# Patient Record
Sex: Female | Born: 1935
Health system: Southern US, Community
[De-identification: ages and names within clinical notes are randomized; demographics above are authoritative.]

## PROBLEM LIST (undated history)

## (undated) DIAGNOSIS — I10 Essential (primary) hypertension: Secondary | ICD-10-CM

## (undated) DIAGNOSIS — E785 Hyperlipidemia, unspecified: Secondary | ICD-10-CM

## (undated) DIAGNOSIS — N393 Stress incontinence (female) (male): Secondary | ICD-10-CM

## (undated) DIAGNOSIS — D6851 Activated protein C resistance: Secondary | ICD-10-CM

## (undated) DIAGNOSIS — O2230 Deep phlebothrombosis in pregnancy, unspecified trimester: Secondary | ICD-10-CM

## (undated) DIAGNOSIS — C55 Malignant neoplasm of uterus, part unspecified: Secondary | ICD-10-CM

## (undated) DIAGNOSIS — I2699 Other pulmonary embolism without acute cor pulmonale: Secondary | ICD-10-CM

## (undated) HISTORY — PX: CLEFT PALATE REPAIR: SUR1165

## (undated) HISTORY — DX: Other pulmonary embolism without acute cor pulmonale: I26.99

## (undated) HISTORY — DX: Malignant neoplasm of uterus, part unspecified: C55

## (undated) HISTORY — PX: HAMMER TOE SURGERY: SHX385

## (undated) HISTORY — DX: Stress incontinence (female) (male): N39.3

## (undated) HISTORY — PX: OTHER SURGICAL HISTORY: SHX169

## (undated) HISTORY — PX: TUBAL LIGATION: SHX77

## (undated) HISTORY — DX: Activated protein C resistance: D68.51

## (undated) HISTORY — DX: Hyperlipidemia, unspecified: E78.5

## (undated) HISTORY — DX: Essential (primary) hypertension: I10

---

## 1981-11-08 DIAGNOSIS — C55 Malignant neoplasm of uterus, part unspecified: Secondary | ICD-10-CM

## 1981-11-08 HISTORY — DX: Malignant neoplasm of uterus, part unspecified: C55

## 1999-12-21 ENCOUNTER — Other Ambulatory Visit: Admission: RE | Admit: 1999-12-21 | Discharge: 1999-12-21 | Payer: Self-pay | Admitting: *Deleted

## 2000-06-30 ENCOUNTER — Other Ambulatory Visit: Admission: RE | Admit: 2000-06-30 | Discharge: 2000-06-30 | Payer: Self-pay | Admitting: *Deleted

## 2000-07-20 ENCOUNTER — Encounter (INDEPENDENT_AMBULATORY_CARE_PROVIDER_SITE_OTHER): Payer: Self-pay

## 2000-07-20 ENCOUNTER — Other Ambulatory Visit: Admission: RE | Admit: 2000-07-20 | Discharge: 2000-07-20 | Payer: Self-pay | Admitting: *Deleted

## 2000-10-11 ENCOUNTER — Ambulatory Visit (HOSPITAL_COMMUNITY): Admission: RE | Admit: 2000-10-11 | Discharge: 2000-10-11 | Payer: Self-pay | Admitting: Gastroenterology

## 2001-03-08 ENCOUNTER — Encounter: Admission: RE | Admit: 2001-03-08 | Discharge: 2001-03-08 | Payer: Self-pay | Admitting: Family Medicine

## 2001-03-08 ENCOUNTER — Encounter: Payer: Self-pay | Admitting: Family Medicine

## 2001-08-10 ENCOUNTER — Other Ambulatory Visit: Admission: RE | Admit: 2001-08-10 | Discharge: 2001-08-10 | Payer: Self-pay | Admitting: *Deleted

## 2004-10-13 ENCOUNTER — Ambulatory Visit: Admission: RE | Admit: 2004-10-13 | Discharge: 2004-10-13 | Payer: Self-pay | Admitting: Gynecologic Oncology

## 2004-11-17 ENCOUNTER — Ambulatory Visit: Admission: RE | Admit: 2004-11-17 | Discharge: 2004-11-17 | Payer: Self-pay | Admitting: Gynecology

## 2004-11-24 ENCOUNTER — Encounter (INDEPENDENT_AMBULATORY_CARE_PROVIDER_SITE_OTHER): Payer: Self-pay | Admitting: Specialist

## 2004-11-24 ENCOUNTER — Ambulatory Visit (HOSPITAL_COMMUNITY): Admission: RE | Admit: 2004-11-24 | Discharge: 2004-11-24 | Payer: Self-pay | Admitting: Gynecology

## 2004-12-29 ENCOUNTER — Ambulatory Visit: Admission: RE | Admit: 2004-12-29 | Discharge: 2004-12-29 | Payer: Self-pay | Admitting: Gynecology

## 2005-07-29 ENCOUNTER — Encounter (INDEPENDENT_AMBULATORY_CARE_PROVIDER_SITE_OTHER): Payer: Self-pay | Admitting: Internal Medicine

## 2005-11-08 DIAGNOSIS — I2699 Other pulmonary embolism without acute cor pulmonale: Secondary | ICD-10-CM

## 2005-11-08 HISTORY — DX: Other pulmonary embolism without acute cor pulmonale: I26.99

## 2006-03-25 ENCOUNTER — Inpatient Hospital Stay (HOSPITAL_COMMUNITY): Admission: EM | Admit: 2006-03-25 | Discharge: 2006-03-28 | Payer: Self-pay | Admitting: Emergency Medicine

## 2006-03-25 ENCOUNTER — Ambulatory Visit: Payer: Self-pay | Admitting: Hospitalist

## 2006-03-26 ENCOUNTER — Encounter (INDEPENDENT_AMBULATORY_CARE_PROVIDER_SITE_OTHER): Payer: Self-pay | Admitting: Cardiology

## 2006-03-31 ENCOUNTER — Ambulatory Visit: Payer: Self-pay | Admitting: Internal Medicine

## 2006-04-05 ENCOUNTER — Ambulatory Visit: Payer: Self-pay | Admitting: Internal Medicine

## 2006-04-11 ENCOUNTER — Ambulatory Visit: Payer: Self-pay | Admitting: Internal Medicine

## 2006-04-15 ENCOUNTER — Ambulatory Visit: Payer: Self-pay | Admitting: Internal Medicine

## 2006-04-20 ENCOUNTER — Ambulatory Visit: Payer: Self-pay | Admitting: Internal Medicine

## 2006-04-25 ENCOUNTER — Ambulatory Visit: Payer: Self-pay | Admitting: Internal Medicine

## 2006-05-02 ENCOUNTER — Ambulatory Visit: Payer: Self-pay | Admitting: Internal Medicine

## 2006-05-16 ENCOUNTER — Ambulatory Visit: Payer: Self-pay | Admitting: Internal Medicine

## 2006-06-06 ENCOUNTER — Ambulatory Visit: Payer: Self-pay | Admitting: Internal Medicine

## 2006-06-27 ENCOUNTER — Ambulatory Visit: Payer: Self-pay | Admitting: Internal Medicine

## 2006-07-14 ENCOUNTER — Ambulatory Visit: Payer: Self-pay | Admitting: Hospitalist

## 2006-07-18 ENCOUNTER — Ambulatory Visit: Payer: Self-pay | Admitting: Internal Medicine

## 2006-08-08 ENCOUNTER — Ambulatory Visit: Payer: Self-pay | Admitting: Hospitalist

## 2006-08-14 ENCOUNTER — Encounter (INDEPENDENT_AMBULATORY_CARE_PROVIDER_SITE_OTHER): Payer: Self-pay | Admitting: Internal Medicine

## 2006-09-12 ENCOUNTER — Ambulatory Visit: Payer: Self-pay | Admitting: Internal Medicine

## 2006-10-17 ENCOUNTER — Ambulatory Visit: Payer: Self-pay | Admitting: Internal Medicine

## 2006-11-21 ENCOUNTER — Ambulatory Visit: Payer: Self-pay | Admitting: Hospitalist

## 2006-12-19 ENCOUNTER — Ambulatory Visit: Payer: Self-pay | Admitting: Hospitalist

## 2007-01-16 ENCOUNTER — Ambulatory Visit: Payer: Self-pay | Admitting: Internal Medicine

## 2007-01-16 ENCOUNTER — Encounter (INDEPENDENT_AMBULATORY_CARE_PROVIDER_SITE_OTHER): Payer: Self-pay | Admitting: Pharmacist

## 2007-01-16 DIAGNOSIS — I10 Essential (primary) hypertension: Secondary | ICD-10-CM | POA: Insufficient documentation

## 2007-01-16 DIAGNOSIS — M810 Age-related osteoporosis without current pathological fracture: Secondary | ICD-10-CM | POA: Insufficient documentation

## 2007-01-16 LAB — CONVERTED CEMR LAB: INR: 2.8

## 2007-02-13 ENCOUNTER — Encounter: Payer: Self-pay | Admitting: Pharmacist

## 2007-02-13 ENCOUNTER — Ambulatory Visit: Payer: Self-pay | Admitting: *Deleted

## 2007-02-13 LAB — CONVERTED CEMR LAB: INR: 3.3

## 2007-03-24 ENCOUNTER — Ambulatory Visit: Payer: Self-pay | Admitting: Hospitalist

## 2007-03-29 LAB — CONVERTED CEMR LAB
INR: 2.3
INR: 2.82

## 2007-04-12 ENCOUNTER — Ambulatory Visit: Payer: Self-pay | Admitting: Internal Medicine

## 2007-04-12 LAB — CONVERTED CEMR LAB
INR: 2.8 — ABNORMAL HIGH (ref 0.0–1.5)
Prothrombin Time: 30.9 s — ABNORMAL HIGH (ref 11.6–15.2)

## 2007-05-22 ENCOUNTER — Ambulatory Visit: Payer: Self-pay | Admitting: *Deleted

## 2007-05-22 LAB — CONVERTED CEMR LAB: INR: 3.9

## 2007-06-05 IMAGING — CT CT ANGIO CHEST
2 of 5 series · 17 of 35 positions shown · IV contrast (120 ML OMNI 300)
Comparison: none

CLINICAL DATA: History of cervical carcinoma.  Bilateral lower extremity DVT.
CT ANGIOGRAPHY OF CHEST:
TECHNIQUE: Multidetector CT imaging of the chest was performed during bolus injection of intravenous contrast.  Multiplanar CT angiographic image reconstructions were generated to evaluate the vascular anatomy.
Contrast:  120 cc Omnipaque 300

[Series 3: pe · axial · 0.74mm/px · z∈[-270,-36]mm · 16 of 214 slices shown]
[im 13/214  lung]
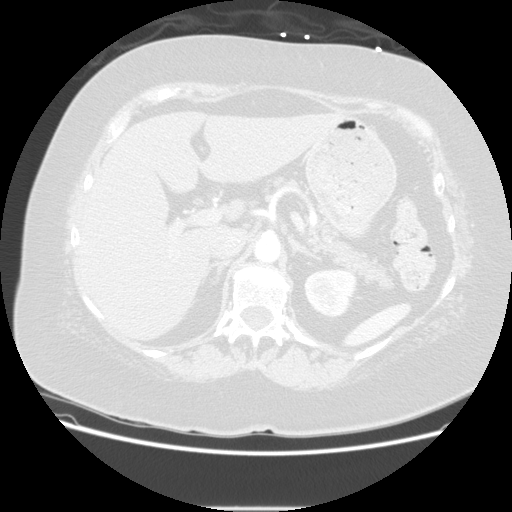
[im 26/214  mediastinal]
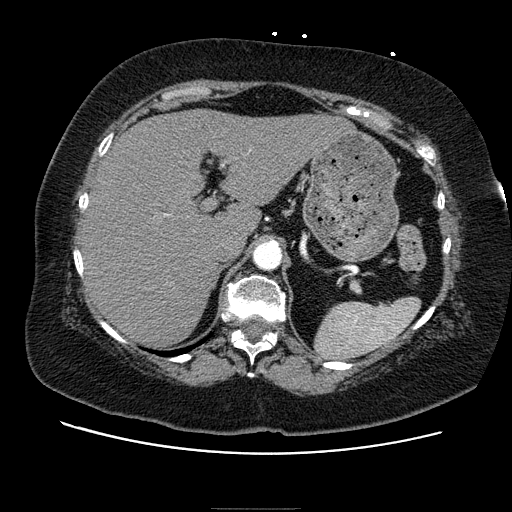
[im 38/214  lung]
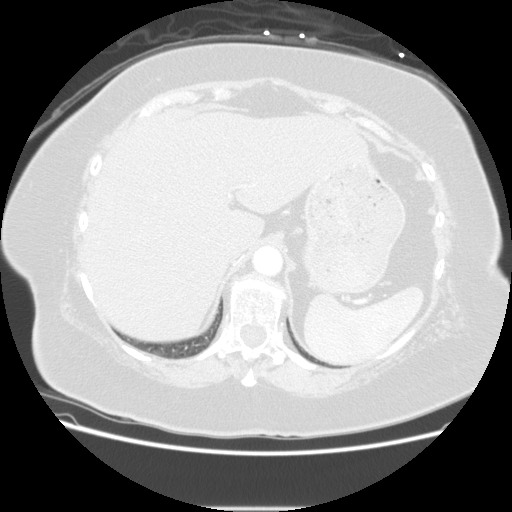
[im 51/214  mediastinal]
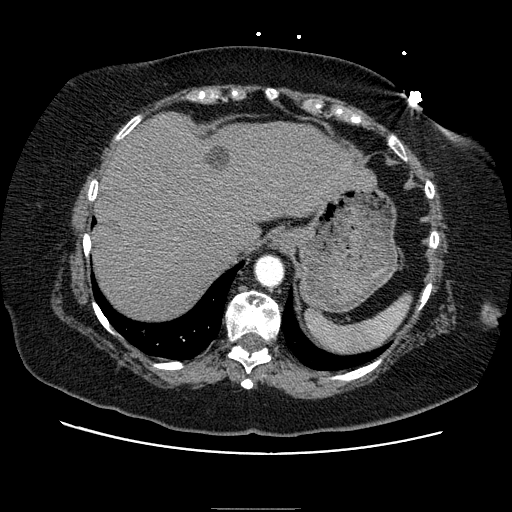
[im 63/214  lung]
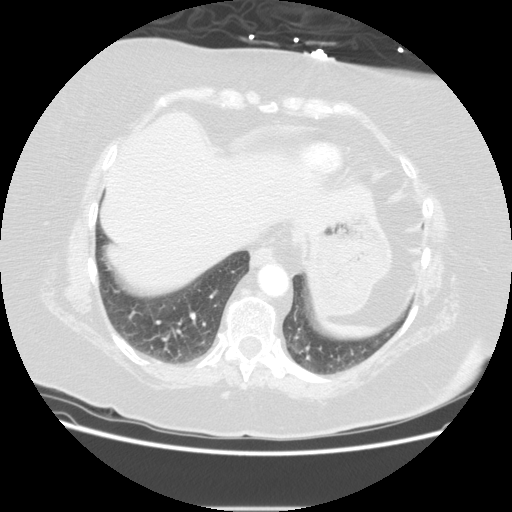
[im 76/214  mediastinal]
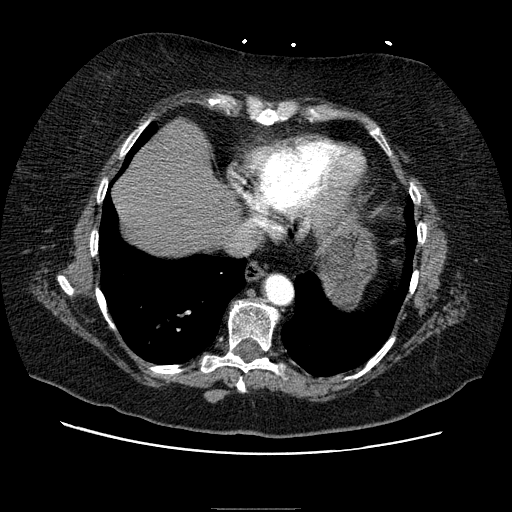
[im 88/214  lung]
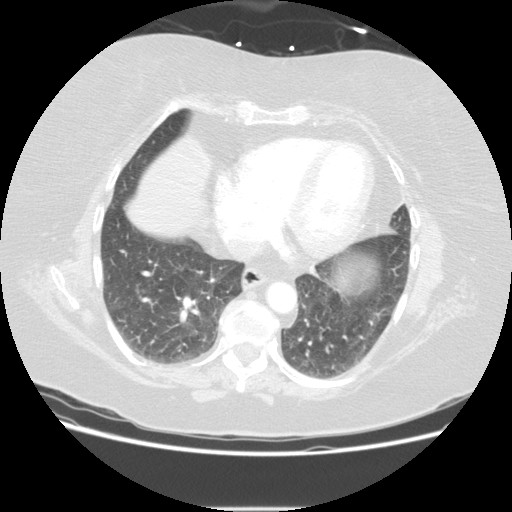
[im 101/214  mediastinal]
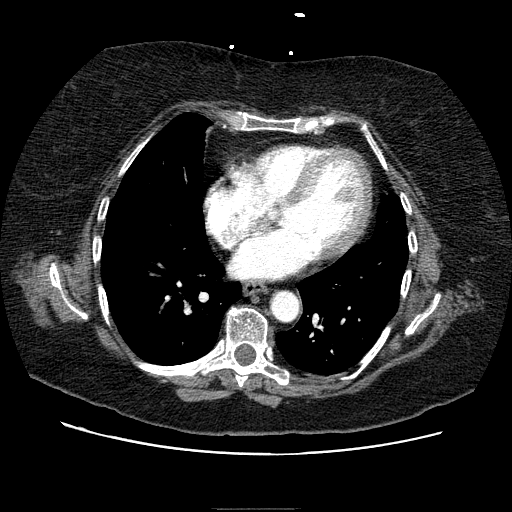
[im 113/214  lung]
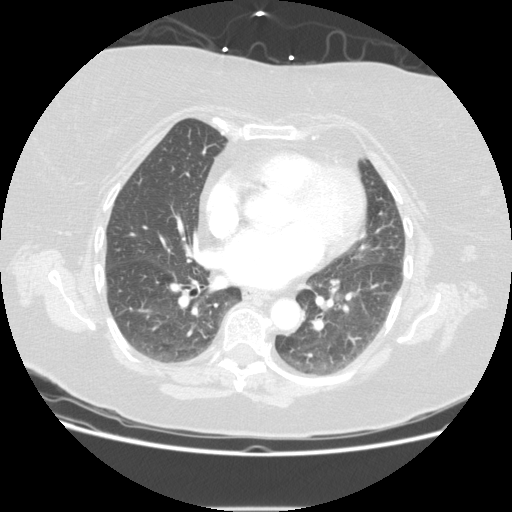
[im 126/214  mediastinal]
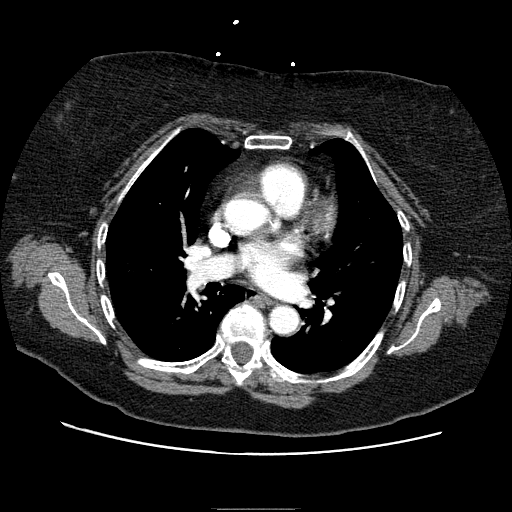
[im 138/214  lung]
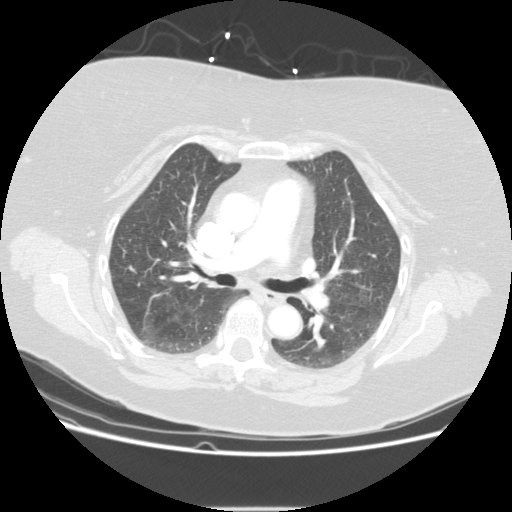
[im 151/214  mediastinal]
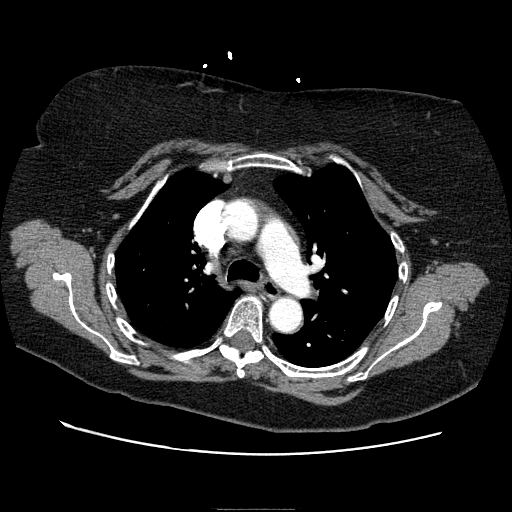
[im 163/214  lung]
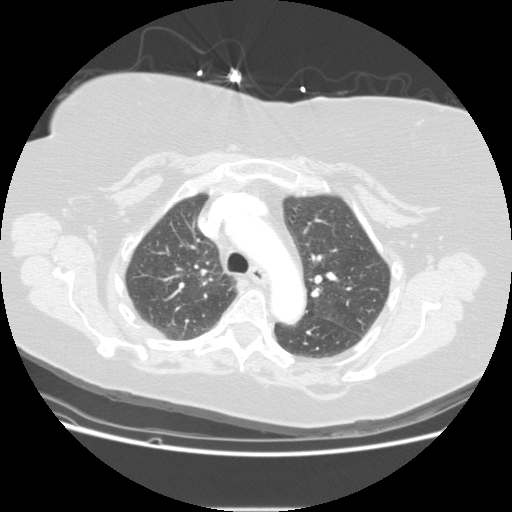
[im 176/214  mediastinal]
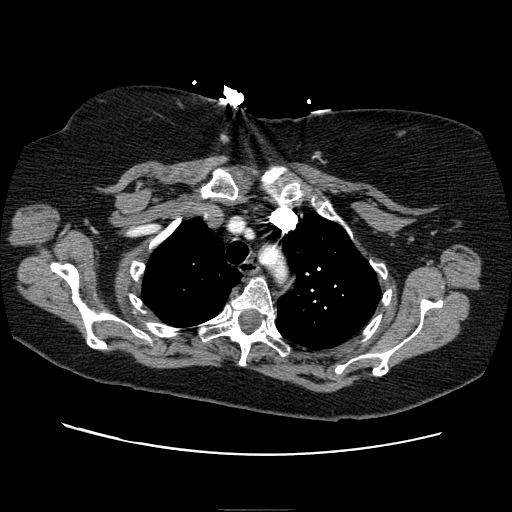
[im 188/214  lung]
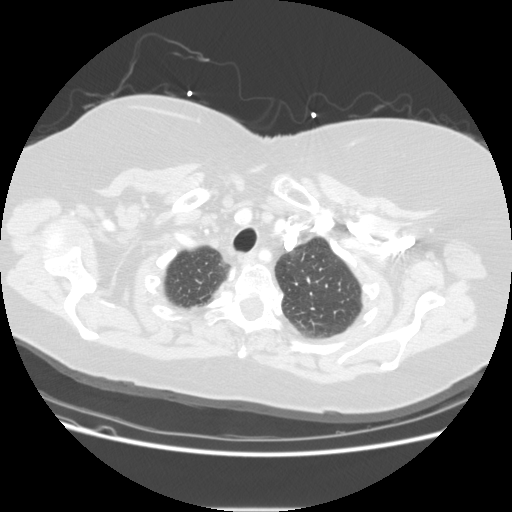
[im 201/214  mediastinal]
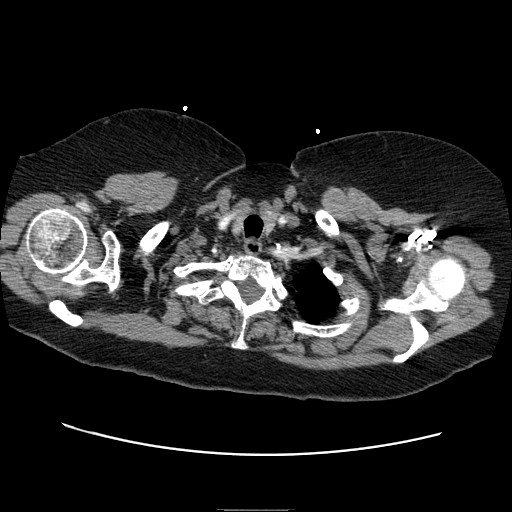

[Series 301: reformatted · coronal · 0.72mm/px · 1 of 116 slices shown]
[im 58/116  mediastinal]
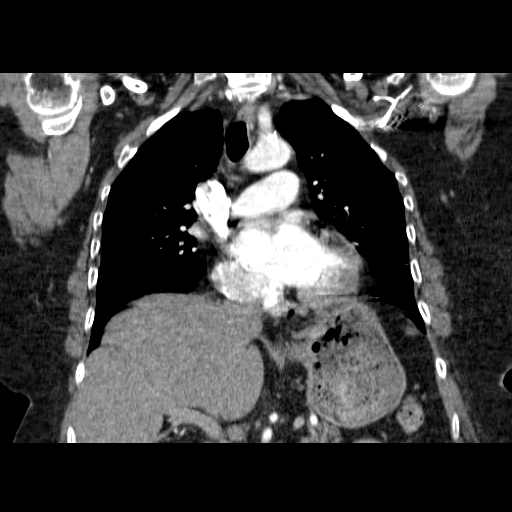

[17 of 35 positions shown; findings below may reference images not displayed]

FINDINGS: There are small filling defects at scattered branch points of the left upper and lower lobe pulmonary arteries compatible with pulmonary emboli.  However, these emboli have obtuse margins with the pulmonary artery wall and likely represents subacute to chronic pulmonary emboli.  No other pulmonary emboli are identified.  Mild cardiomegaly is present.  There is no evidence of thoracic aortic dissection or aneurysm.  No enlarged lymph nodes within the axillary, mediastinal, or hilar regions identified.  There are no pleural or pericardial effusions present.  Mild ground glass opacities through the mid and lower lungs are nonspecific but may be related to small vessel disease or changes associated with pulmonary emboli.  Hepatic cyst is noted.
IMPRESSION: Pulmonary emboli at branch points of several left pulmonary arteries with appearance and configuration suggestive of subacute to chronic pulmonary emboli.

## 2007-06-19 ENCOUNTER — Ambulatory Visit: Payer: Self-pay | Admitting: Infectious Disease

## 2007-06-19 LAB — CONVERTED CEMR LAB: INR: 2.6

## 2007-07-17 ENCOUNTER — Ambulatory Visit: Payer: Self-pay | Admitting: Internal Medicine

## 2007-07-17 LAB — CONVERTED CEMR LAB: INR: 2

## 2007-08-15 ENCOUNTER — Encounter (INDEPENDENT_AMBULATORY_CARE_PROVIDER_SITE_OTHER): Payer: Self-pay | Admitting: Internal Medicine

## 2007-08-21 ENCOUNTER — Ambulatory Visit: Payer: Self-pay | Admitting: Infectious Diseases

## 2007-08-21 LAB — CONVERTED CEMR LAB: INR: 2.6

## 2007-09-18 ENCOUNTER — Ambulatory Visit: Payer: Self-pay | Admitting: Internal Medicine

## 2007-10-23 ENCOUNTER — Ambulatory Visit: Payer: Self-pay | Admitting: Infectious Diseases

## 2007-10-23 LAB — CONVERTED CEMR LAB: INR: 5.3

## 2007-10-30 ENCOUNTER — Ambulatory Visit: Payer: Self-pay | Admitting: Hospitalist

## 2007-10-30 LAB — CONVERTED CEMR LAB: INR: 1.8

## 2007-11-27 ENCOUNTER — Ambulatory Visit: Payer: Self-pay | Admitting: Internal Medicine

## 2007-11-27 LAB — CONVERTED CEMR LAB: INR: 3.2

## 2007-12-25 ENCOUNTER — Ambulatory Visit: Payer: Self-pay | Admitting: Hospitalist

## 2007-12-25 LAB — CONVERTED CEMR LAB: INR: 2.3

## 2008-01-22 ENCOUNTER — Ambulatory Visit: Payer: Self-pay | Admitting: Hospitalist

## 2008-01-22 LAB — CONVERTED CEMR LAB: INR: 1.7

## 2008-02-12 ENCOUNTER — Ambulatory Visit: Payer: Self-pay | Admitting: Internal Medicine

## 2008-02-12 ENCOUNTER — Ambulatory Visit: Payer: Self-pay | Admitting: Hospitalist

## 2008-02-12 LAB — CONVERTED CEMR LAB
ALT: 16 units/L (ref 0–35)
AST: 21 units/L (ref 0–37)
Albumin: 4.4 g/dL (ref 3.5–5.2)
Alkaline Phosphatase: 95 units/L (ref 39–117)
BUN: 22 mg/dL (ref 6–23)
CO2: 22 meq/L (ref 19–32)
Calcium: 9.8 mg/dL (ref 8.4–10.5)
Chloride: 106 meq/L (ref 96–112)
Creatinine, Ser: 0.85 mg/dL (ref 0.40–1.20)
Glucose, Bld: 85 mg/dL (ref 70–99)
INR: 2.5
Potassium: 4.4 meq/L (ref 3.5–5.3)
Sodium: 140 meq/L (ref 135–145)
Total Bilirubin: 0.3 mg/dL (ref 0.3–1.2)
Total Protein: 7.4 g/dL (ref 6.0–8.3)

## 2008-02-19 ENCOUNTER — Ambulatory Visit: Payer: Self-pay | Admitting: Internal Medicine

## 2008-02-19 LAB — CONVERTED CEMR LAB: INR: 3.1

## 2008-02-20 ENCOUNTER — Encounter (INDEPENDENT_AMBULATORY_CARE_PROVIDER_SITE_OTHER): Payer: Self-pay | Admitting: Internal Medicine

## 2008-02-28 ENCOUNTER — Ambulatory Visit: Payer: Self-pay | Admitting: Hospitalist

## 2008-02-28 ENCOUNTER — Ambulatory Visit (HOSPITAL_COMMUNITY): Admission: RE | Admit: 2008-02-28 | Discharge: 2008-02-28 | Payer: Self-pay | Admitting: Hospitalist

## 2008-02-28 ENCOUNTER — Telehealth: Payer: Self-pay | Admitting: *Deleted

## 2008-02-29 ENCOUNTER — Encounter: Payer: Self-pay | Admitting: Internal Medicine

## 2008-03-04 ENCOUNTER — Encounter (INDEPENDENT_AMBULATORY_CARE_PROVIDER_SITE_OTHER): Payer: Self-pay | Admitting: Internal Medicine

## 2008-03-04 ENCOUNTER — Ambulatory Visit: Payer: Self-pay | Admitting: Hospitalist

## 2008-03-04 LAB — CONVERTED CEMR LAB
ALT: 16 units/L (ref 0–35)
AST: 21 units/L (ref 0–37)
Albumin: 4.2 g/dL (ref 3.5–5.2)
Alkaline Phosphatase: 85 units/L (ref 39–117)
BUN: 26 mg/dL — ABNORMAL HIGH (ref 6–23)
Basophils Absolute: 0 10*3/uL (ref 0.0–0.1)
Basophils Relative: 0 % (ref 0–1)
CO2: 19 meq/L (ref 19–32)
Calcium: 9.1 mg/dL (ref 8.4–10.5)
Chloride: 109 meq/L (ref 96–112)
Cholesterol: 223 mg/dL — ABNORMAL HIGH (ref 0–200)
Creatinine, Ser: 0.84 mg/dL (ref 0.40–1.20)
Eosinophils Absolute: 0.2 10*3/uL (ref 0.0–0.7)
Eosinophils Relative: 3 % (ref 0–5)
Glucose, Bld: 89 mg/dL (ref 70–99)
HCT: 44.5 % (ref 36.0–46.0)
HDL: 58 mg/dL (ref >39)
Hemoglobin: 13.9 g/dL (ref 12.0–15.0)
INR: 2.2
LDL Cholesterol: 126 mg/dL — ABNORMAL HIGH (ref 0–99)
Lymphocytes Relative: 30 % (ref 12–46)
Lymphs Abs: 1.8 10*3/uL (ref 0.7–4.0)
MCHC: 31.2 g/dL (ref 30.0–36.0)
MCV: 100.2 fL — ABNORMAL HIGH (ref 78.0–100.0)
Monocytes Absolute: 0.5 10*3/uL (ref 0.1–1.0)
Monocytes Relative: 9 % (ref 3–12)
Neutro Abs: 3.5 10*3/uL (ref 1.7–7.7)
Neutrophils Relative %: 58 % (ref 43–77)
Platelets: 263 10*3/uL (ref 150–400)
Potassium: 4.7 meq/L (ref 3.5–5.3)
RBC: 4.44 M/uL (ref 3.87–5.11)
RDW: 12.9 % (ref 11.5–15.5)
Sodium: 143 meq/L (ref 135–145)
TSH: 1.728 microintl units/mL (ref 0.350–5.50)
Total Bilirubin: 0.3 mg/dL (ref 0.3–1.2)
Total CHOL/HDL Ratio: 3.8
Total Protein: 7 g/dL (ref 6.0–8.3)
Triglycerides: 195 mg/dL — ABNORMAL HIGH (ref <150)
VLDL: 39 mg/dL (ref 0–40)
WBC: 6 10*3/uL (ref 4.0–10.5)

## 2008-03-19 ENCOUNTER — Ambulatory Visit: Payer: Self-pay | Admitting: Internal Medicine

## 2008-03-25 ENCOUNTER — Ambulatory Visit: Payer: Self-pay | Admitting: Internal Medicine

## 2008-04-09 ENCOUNTER — Encounter (INDEPENDENT_AMBULATORY_CARE_PROVIDER_SITE_OTHER): Payer: Self-pay | Admitting: Internal Medicine

## 2008-04-22 ENCOUNTER — Ambulatory Visit: Payer: Self-pay | Admitting: *Deleted

## 2008-04-22 LAB — CONVERTED CEMR LAB: INR: 5.9

## 2008-04-29 ENCOUNTER — Ambulatory Visit: Payer: Self-pay | Admitting: Internal Medicine

## 2008-04-29 LAB — CONVERTED CEMR LAB: INR: 2.1

## 2008-05-01 ENCOUNTER — Encounter (INDEPENDENT_AMBULATORY_CARE_PROVIDER_SITE_OTHER): Payer: Self-pay | Admitting: Internal Medicine

## 2008-05-06 ENCOUNTER — Encounter (INDEPENDENT_AMBULATORY_CARE_PROVIDER_SITE_OTHER): Payer: Self-pay | Admitting: Internal Medicine

## 2008-05-27 ENCOUNTER — Ambulatory Visit: Payer: Self-pay | Admitting: Infectious Diseases

## 2008-05-27 LAB — CONVERTED CEMR LAB: INR: 3

## 2008-06-26 ENCOUNTER — Encounter (INDEPENDENT_AMBULATORY_CARE_PROVIDER_SITE_OTHER): Payer: Self-pay | Admitting: Internal Medicine

## 2008-07-01 ENCOUNTER — Ambulatory Visit: Payer: Self-pay | Admitting: Internal Medicine

## 2008-07-01 LAB — CONVERTED CEMR LAB: INR: 2.1

## 2008-07-02 ENCOUNTER — Ambulatory Visit: Payer: Self-pay | Admitting: Internal Medicine

## 2008-07-29 ENCOUNTER — Ambulatory Visit: Payer: Self-pay | Admitting: Internal Medicine

## 2008-07-29 LAB — CONVERTED CEMR LAB: INR: 2.9

## 2008-08-15 ENCOUNTER — Encounter (INDEPENDENT_AMBULATORY_CARE_PROVIDER_SITE_OTHER): Payer: Self-pay | Admitting: Internal Medicine

## 2008-08-21 ENCOUNTER — Encounter (INDEPENDENT_AMBULATORY_CARE_PROVIDER_SITE_OTHER): Payer: Self-pay | Admitting: Internal Medicine

## 2008-09-09 ENCOUNTER — Ambulatory Visit: Payer: Self-pay | Admitting: Infectious Diseases

## 2008-09-09 LAB — CONVERTED CEMR LAB: INR: 2

## 2008-10-14 ENCOUNTER — Ambulatory Visit: Payer: Self-pay | Admitting: Internal Medicine

## 2008-10-14 LAB — CONVERTED CEMR LAB: INR: 2.1

## 2008-11-11 ENCOUNTER — Ambulatory Visit: Payer: Self-pay | Admitting: Internal Medicine

## 2008-11-11 LAB — CONVERTED CEMR LAB: INR: 4.3

## 2008-11-19 ENCOUNTER — Ambulatory Visit: Payer: Self-pay | Admitting: Internal Medicine

## 2008-11-19 DIAGNOSIS — D539 Nutritional anemia, unspecified: Secondary | ICD-10-CM | POA: Insufficient documentation

## 2008-11-21 ENCOUNTER — Encounter (INDEPENDENT_AMBULATORY_CARE_PROVIDER_SITE_OTHER): Payer: Self-pay | Admitting: Internal Medicine

## 2008-11-21 LAB — CONVERTED CEMR LAB
BUN: 24 mg/dL — ABNORMAL HIGH (ref 6–23)
CO2: 26 meq/L (ref 19–32)
Calcium: 10 mg/dL (ref 8.4–10.5)
Chloride: 105 meq/L (ref 96–112)
Creatinine, Ser: 0.9 mg/dL (ref 0.40–1.20)
Glucose, Bld: 88 mg/dL (ref 70–99)
HCT: 42.2 % (ref 36.0–46.0)
Hemoglobin: 13.4 g/dL (ref 12.0–15.0)
MCHC: 31.8 g/dL (ref 30.0–36.0)
MCV: 99.5 fL (ref 78.0–100.0)
Platelets: 348 10*3/uL (ref 150–400)
Potassium: 5.6 meq/L — ABNORMAL HIGH (ref 3.5–5.3)
RBC: 4.24 M/uL (ref 3.87–5.11)
RDW: 13.9 % (ref 11.5–15.5)
Sodium: 142 meq/L (ref 135–145)
WBC: 6.8 10*3/uL (ref 4.0–10.5)

## 2008-12-02 ENCOUNTER — Ambulatory Visit: Payer: Self-pay | Admitting: Internal Medicine

## 2008-12-02 ENCOUNTER — Encounter (INDEPENDENT_AMBULATORY_CARE_PROVIDER_SITE_OTHER): Payer: Self-pay | Admitting: Internal Medicine

## 2008-12-02 LAB — CONVERTED CEMR LAB: INR: 1.8

## 2008-12-05 LAB — CONVERTED CEMR LAB
BUN: 21 mg/dL (ref 6–23)
CO2: 23 meq/L (ref 19–32)
Calcium: 9.9 mg/dL (ref 8.4–10.5)
Chloride: 105 meq/L (ref 96–112)
Creatinine, Ser: 0.75 mg/dL (ref 0.40–1.20)
Glucose, Bld: 80 mg/dL (ref 70–99)
Potassium: 5.3 meq/L (ref 3.5–5.3)
Sodium: 141 meq/L (ref 135–145)

## 2008-12-24 ENCOUNTER — Ambulatory Visit: Payer: Self-pay | Admitting: Internal Medicine

## 2008-12-24 DIAGNOSIS — I872 Venous insufficiency (chronic) (peripheral): Secondary | ICD-10-CM | POA: Insufficient documentation

## 2008-12-27 LAB — CONVERTED CEMR LAB
ALT: 13 units/L (ref 0–35)
AST: 18 units/L (ref 0–37)
Albumin: 4.2 g/dL (ref 3.5–5.2)
Alkaline Phosphatase: 81 units/L (ref 39–117)
BUN: 24 mg/dL — ABNORMAL HIGH (ref 6–23)
CO2: 23 meq/L (ref 19–32)
Calcium: 9.5 mg/dL (ref 8.4–10.5)
Chloride: 105 meq/L (ref 96–112)
Cholesterol: 205 mg/dL — ABNORMAL HIGH (ref 0–200)
Creatinine, Ser: 0.84 mg/dL (ref 0.40–1.20)
Glucose, Bld: 86 mg/dL (ref 70–99)
HDL: 70 mg/dL (ref >39)
LDL Cholesterol: 98 mg/dL (ref 0–99)
Potassium: 5.2 meq/L (ref 3.5–5.3)
Sodium: 141 meq/L (ref 135–145)
Total Bilirubin: 0.2 mg/dL — ABNORMAL LOW (ref 0.3–1.2)
Total CHOL/HDL Ratio: 2.9
Total Protein: 7.1 g/dL (ref 6.0–8.3)
Triglycerides: 183 mg/dL — ABNORMAL HIGH (ref <150)
VLDL: 37 mg/dL (ref 0–40)

## 2009-01-02 ENCOUNTER — Ambulatory Visit: Payer: Self-pay | Admitting: Internal Medicine

## 2009-01-02 LAB — CONVERTED CEMR LAB: INR: 3.5

## 2009-01-06 ENCOUNTER — Telehealth: Payer: Self-pay | Admitting: *Deleted

## 2009-01-09 ENCOUNTER — Encounter (INDEPENDENT_AMBULATORY_CARE_PROVIDER_SITE_OTHER): Payer: Self-pay | Admitting: Internal Medicine

## 2009-01-27 ENCOUNTER — Ambulatory Visit: Payer: Self-pay | Admitting: Internal Medicine

## 2009-01-27 LAB — CONVERTED CEMR LAB: INR: 2.4

## 2009-01-30 ENCOUNTER — Encounter (INDEPENDENT_AMBULATORY_CARE_PROVIDER_SITE_OTHER): Payer: Self-pay | Admitting: Internal Medicine

## 2009-02-25 ENCOUNTER — Ambulatory Visit: Payer: Self-pay | Admitting: Internal Medicine

## 2009-02-26 LAB — CONVERTED CEMR LAB
BUN: 25 mg/dL — ABNORMAL HIGH (ref 6–23)
CO2: 24 meq/L (ref 19–32)
Calcium: 10.3 mg/dL (ref 8.4–10.5)
Chloride: 103 meq/L (ref 96–112)
Creatinine, Ser: 0.88 mg/dL (ref 0.40–1.20)
GFR calc Af Amer: >60 mL/min (ref >60)
GFR calc non Af Amer: >60 mL/min (ref >60)
Glucose, Bld: 100 mg/dL — ABNORMAL HIGH (ref 70–99)
Potassium: 4.7 meq/L (ref 3.5–5.3)
Sodium: 141 meq/L (ref 135–145)

## 2009-03-03 ENCOUNTER — Ambulatory Visit: Payer: Self-pay | Admitting: Internal Medicine

## 2009-03-03 ENCOUNTER — Encounter: Payer: Self-pay | Admitting: Pharmacist

## 2009-03-03 LAB — CONVERTED CEMR LAB: INR: 3.3

## 2009-04-21 ENCOUNTER — Ambulatory Visit: Payer: Self-pay | Admitting: Internal Medicine

## 2009-04-21 LAB — CONVERTED CEMR LAB: INR: 3.2

## 2009-05-19 ENCOUNTER — Ambulatory Visit: Payer: Self-pay | Admitting: Infectious Diseases

## 2009-05-19 LAB — CONVERTED CEMR LAB: INR: 2.1

## 2009-06-16 ENCOUNTER — Ambulatory Visit: Payer: Self-pay | Admitting: Internal Medicine

## 2009-06-16 LAB — CONVERTED CEMR LAB: INR: 2

## 2009-06-19 ENCOUNTER — Ambulatory Visit: Payer: Self-pay | Admitting: Internal Medicine

## 2009-06-19 DIAGNOSIS — D6851 Activated protein C resistance: Secondary | ICD-10-CM | POA: Insufficient documentation

## 2009-06-24 ENCOUNTER — Ambulatory Visit: Payer: Self-pay | Admitting: Internal Medicine

## 2009-07-21 ENCOUNTER — Ambulatory Visit: Payer: Self-pay | Admitting: Infectious Diseases

## 2009-07-21 LAB — CONVERTED CEMR LAB: INR: 3.1

## 2009-08-11 ENCOUNTER — Ambulatory Visit: Payer: Self-pay | Admitting: Internal Medicine

## 2009-08-11 LAB — CONVERTED CEMR LAB: INR: 3.7

## 2009-08-27 ENCOUNTER — Encounter: Payer: Self-pay | Admitting: Internal Medicine

## 2009-09-01 ENCOUNTER — Ambulatory Visit: Payer: Self-pay | Admitting: Internal Medicine

## 2009-09-01 LAB — CONVERTED CEMR LAB: INR: 2.3

## 2009-09-30 ENCOUNTER — Ambulatory Visit: Payer: Self-pay | Admitting: Internal Medicine

## 2009-10-01 ENCOUNTER — Telehealth: Payer: Self-pay | Admitting: *Deleted

## 2009-10-01 LAB — CONVERTED CEMR LAB
INR: 2.39 — ABNORMAL HIGH (ref <1.50)
Prothrombin Time: 25.9 s — ABNORMAL HIGH (ref 11.6–15.2)

## 2009-10-06 ENCOUNTER — Telehealth (INDEPENDENT_AMBULATORY_CARE_PROVIDER_SITE_OTHER): Payer: Self-pay | Admitting: Pharmacist

## 2009-11-10 ENCOUNTER — Ambulatory Visit: Payer: Self-pay | Admitting: Internal Medicine

## 2009-11-10 LAB — CONVERTED CEMR LAB: INR: 3.9

## 2009-11-18 ENCOUNTER — Telehealth: Payer: Self-pay | Admitting: Internal Medicine

## 2009-12-01 ENCOUNTER — Ambulatory Visit: Payer: Self-pay | Admitting: Internal Medicine

## 2009-12-01 LAB — CONVERTED CEMR LAB: INR: 2.1

## 2009-12-15 ENCOUNTER — Telehealth: Payer: Self-pay | Admitting: Internal Medicine

## 2009-12-15 ENCOUNTER — Ambulatory Visit: Payer: Self-pay | Admitting: Internal Medicine

## 2009-12-15 ENCOUNTER — Encounter: Payer: Self-pay | Admitting: Pharmacist

## 2009-12-15 LAB — CONVERTED CEMR LAB: INR: 1.7

## 2010-01-12 ENCOUNTER — Ambulatory Visit: Payer: Self-pay | Admitting: Infectious Diseases

## 2010-01-12 LAB — CONVERTED CEMR LAB: INR: 4.4

## 2010-01-26 ENCOUNTER — Ambulatory Visit: Payer: Self-pay | Admitting: Internal Medicine

## 2010-01-26 LAB — CONVERTED CEMR LAB: INR: 4.2

## 2010-02-16 ENCOUNTER — Ambulatory Visit: Payer: Self-pay | Admitting: Internal Medicine

## 2010-02-16 LAB — CONVERTED CEMR LAB: INR: 2.2

## 2010-03-16 ENCOUNTER — Ambulatory Visit: Payer: Self-pay | Admitting: Internal Medicine

## 2010-03-16 LAB — CONVERTED CEMR LAB: INR: 2.5

## 2010-04-13 ENCOUNTER — Ambulatory Visit: Payer: Self-pay | Admitting: Internal Medicine

## 2010-04-13 LAB — CONVERTED CEMR LAB: INR: 2.3

## 2010-05-04 ENCOUNTER — Ambulatory Visit: Payer: Self-pay | Admitting: Internal Medicine

## 2010-05-04 LAB — CONVERTED CEMR LAB: INR: 1.8

## 2010-05-25 ENCOUNTER — Ambulatory Visit: Payer: Self-pay | Admitting: Internal Medicine

## 2010-05-25 LAB — CONVERTED CEMR LAB: INR: 2.8

## 2010-06-22 ENCOUNTER — Ambulatory Visit: Payer: Self-pay | Admitting: Internal Medicine

## 2010-06-22 LAB — CONVERTED CEMR LAB: INR: 3.2

## 2010-07-07 ENCOUNTER — Ambulatory Visit (HOSPITAL_COMMUNITY): Admission: RE | Admit: 2010-07-07 | Discharge: 2010-07-07 | Payer: Self-pay | Admitting: Internal Medicine

## 2010-07-07 ENCOUNTER — Ambulatory Visit: Payer: Self-pay | Admitting: Internal Medicine

## 2010-07-20 ENCOUNTER — Ambulatory Visit: Payer: Self-pay | Admitting: Internal Medicine

## 2010-07-20 LAB — CONVERTED CEMR LAB
ALT: 15 units/L (ref 0–35)
AST: 22 units/L (ref 0–37)
Albumin: 4.2 g/dL (ref 3.5–5.2)
Alkaline Phosphatase: 83 units/L (ref 39–117)
BUN: 25 mg/dL — ABNORMAL HIGH (ref 6–23)
CO2: 26 meq/L (ref 19–32)
Calcium: 9.6 mg/dL (ref 8.4–10.5)
Chloride: 103 meq/L (ref 96–112)
Cholesterol: 206 mg/dL — ABNORMAL HIGH (ref 0–200)
Creatinine, Ser: 0.93 mg/dL (ref 0.40–1.20)
Direct LDL: 133 mg/dL — ABNORMAL HIGH
Glucose, Bld: 92 mg/dL (ref 70–99)
HDL: 65 mg/dL (ref >39)
INR: 3.1
LDL Cholesterol: 96 mg/dL (ref 0–99)
Potassium: 4.2 meq/L (ref 3.5–5.3)
Sodium: 141 meq/L (ref 135–145)
Total Bilirubin: 0.3 mg/dL (ref 0.3–1.2)
Total CHOL/HDL Ratio: 3.2
Total Protein: 7.1 g/dL (ref 6.0–8.3)
Triglycerides: 226 mg/dL — ABNORMAL HIGH (ref <150)
VLDL: 45 mg/dL — ABNORMAL HIGH (ref 0–40)

## 2010-08-18 ENCOUNTER — Ambulatory Visit: Payer: Self-pay | Admitting: Internal Medicine

## 2010-08-18 LAB — CONVERTED CEMR LAB: INR: 2.9

## 2010-08-19 LAB — CONVERTED CEMR LAB
Cholesterol: 203 mg/dL — ABNORMAL HIGH (ref 0–200)
HDL: 63 mg/dL (ref >39)
LDL Cholesterol: 111 mg/dL — ABNORMAL HIGH (ref 0–99)
Total CHOL/HDL Ratio: 3.2
Triglycerides: 146 mg/dL (ref <150)
VLDL: 29 mg/dL (ref 0–40)

## 2010-09-01 ENCOUNTER — Encounter: Payer: Self-pay | Admitting: Internal Medicine

## 2010-09-07 ENCOUNTER — Ambulatory Visit: Payer: Self-pay | Admitting: Internal Medicine

## 2010-09-07 ENCOUNTER — Ambulatory Visit: Payer: Self-pay | Admitting: Cardiology

## 2010-09-07 LAB — CONVERTED CEMR LAB: INR: 2

## 2010-09-08 ENCOUNTER — Telehealth: Payer: Self-pay | Admitting: Internal Medicine

## 2010-09-16 ENCOUNTER — Telehealth (INDEPENDENT_AMBULATORY_CARE_PROVIDER_SITE_OTHER): Payer: Self-pay | Admitting: *Deleted

## 2010-09-17 ENCOUNTER — Encounter: Payer: Self-pay | Admitting: Cardiology

## 2010-09-17 ENCOUNTER — Ambulatory Visit: Payer: Self-pay | Admitting: Cardiology

## 2010-09-17 ENCOUNTER — Ambulatory Visit (HOSPITAL_COMMUNITY): Admission: RE | Admit: 2010-09-17 | Discharge: 2010-09-17 | Payer: Self-pay | Admitting: Cardiology

## 2010-09-17 ENCOUNTER — Ambulatory Visit: Payer: Self-pay

## 2010-09-17 ENCOUNTER — Encounter (HOSPITAL_COMMUNITY)
Admission: RE | Admit: 2010-09-17 | Discharge: 2010-11-07 | Payer: Self-pay | Source: Home / Self Care | Attending: Cardiology | Admitting: Cardiology

## 2010-09-28 ENCOUNTER — Telehealth: Payer: Self-pay | Admitting: Cardiology

## 2010-09-28 ENCOUNTER — Ambulatory Visit: Payer: Self-pay | Admitting: Cardiology

## 2010-09-28 DIAGNOSIS — J342 Deviated nasal septum: Secondary | ICD-10-CM | POA: Insufficient documentation

## 2010-10-04 ENCOUNTER — Encounter: Payer: Self-pay | Admitting: Cardiology

## 2010-10-05 ENCOUNTER — Telehealth (INDEPENDENT_AMBULATORY_CARE_PROVIDER_SITE_OTHER): Payer: Self-pay | Admitting: *Deleted

## 2010-10-05 ENCOUNTER — Ambulatory Visit: Payer: Self-pay | Admitting: Cardiology

## 2010-10-07 LAB — CONVERTED CEMR LAB
BUN: 23 mg/dL (ref 6–23)
BUN: 17 mg/dL (ref 6–23)
CO2: 28 meq/L (ref 19–32)
CO2: 27 meq/L (ref 19–32)
Calcium: 9.8 mg/dL (ref 8.4–10.5)
Calcium: 9.6 mg/dL (ref 8.4–10.5)
Chloride: 102 meq/L (ref 96–112)
Chloride: 103 meq/L (ref 96–112)
Creatinine, Ser: 1 mg/dL (ref 0.4–1.2)
Creatinine, Ser: 1 mg/dL (ref 0.4–1.2)
GFR calc non Af Amer: 56.5 mL/min (ref >60)
GFR calc non Af Amer: 59.87 mL/min (ref >60)
Glucose, Bld: 94 mg/dL (ref 70–99)
Glucose, Bld: 97 mg/dL (ref 70–99)
Potassium: 5.5 meq/L — ABNORMAL HIGH (ref 3.5–5.1)
Potassium: 4.5 meq/L (ref 3.5–5.1)
Sodium: 138 meq/L (ref 135–145)
Sodium: 140 meq/L (ref 135–145)

## 2010-10-13 ENCOUNTER — Telehealth: Payer: Self-pay | Admitting: Cardiology

## 2010-10-13 ENCOUNTER — Ambulatory Visit: Payer: Self-pay | Admitting: Internal Medicine

## 2010-10-13 LAB — CONVERTED CEMR LAB: INR: 2.5

## 2010-10-16 ENCOUNTER — Ambulatory Visit: Payer: Self-pay | Admitting: Pulmonary Disease

## 2010-10-19 ENCOUNTER — Telehealth: Payer: Self-pay | Admitting: Pulmonary Disease

## 2010-10-22 ENCOUNTER — Ambulatory Visit: Payer: Self-pay | Admitting: Cardiology

## 2010-10-22 ENCOUNTER — Telehealth (INDEPENDENT_AMBULATORY_CARE_PROVIDER_SITE_OTHER): Payer: Self-pay | Admitting: *Deleted

## 2010-10-23 ENCOUNTER — Telehealth: Payer: Self-pay | Admitting: Cardiology

## 2010-10-26 LAB — CONVERTED CEMR LAB
BUN: 20 mg/dL (ref 6–23)
CO2: 29 meq/L (ref 19–32)
Calcium: 9.5 mg/dL (ref 8.4–10.5)
Chloride: 105 meq/L (ref 96–112)
Creatinine, Ser: 0.8 mg/dL (ref 0.4–1.2)
GFR calc non Af Amer: 70.68 mL/min (ref >60.00)
Glucose, Bld: 84 mg/dL (ref 70–99)
Potassium: 4.5 meq/L (ref 3.5–5.1)
Sodium: 142 meq/L (ref 135–145)

## 2010-11-05 ENCOUNTER — Encounter: Payer: Self-pay | Admitting: Cardiology

## 2010-11-06 ENCOUNTER — Ambulatory Visit: Payer: Self-pay | Admitting: Cardiology

## 2010-11-06 ENCOUNTER — Encounter: Payer: Self-pay | Admitting: Pharmacist

## 2010-11-09 ENCOUNTER — Ambulatory Visit: Payer: Self-pay | Admitting: Internal Medicine

## 2010-11-09 LAB — CONVERTED CEMR LAB: INR: 3.3

## 2010-11-12 LAB — CONVERTED CEMR LAB
BUN: 23 mg/dL (ref 6–23)
CO2: 26 meq/L (ref 19–32)
Calcium: 9.4 mg/dL (ref 8.4–10.5)
Chloride: 109 meq/L (ref 96–112)
Creatinine, Ser: 0.8 mg/dL (ref 0.4–1.2)
GFR calc non Af Amer: 75.86 mL/min (ref >60.00)
Glucose, Bld: 82 mg/dL (ref 70–99)
Potassium: 4.7 meq/L (ref 3.5–5.1)
Sodium: 143 meq/L (ref 135–145)

## 2010-11-17 ENCOUNTER — Ambulatory Visit
Admission: RE | Admit: 2010-11-17 | Discharge: 2010-11-17 | Payer: Self-pay | Source: Home / Self Care | Attending: Pulmonary Disease | Admitting: Pulmonary Disease

## 2010-11-17 ENCOUNTER — Encounter: Payer: Self-pay | Admitting: Pulmonary Disease

## 2010-11-18 ENCOUNTER — Ambulatory Visit
Admission: RE | Admit: 2010-11-18 | Discharge: 2010-11-18 | Payer: Self-pay | Source: Home / Self Care | Attending: Pulmonary Disease | Admitting: Pulmonary Disease

## 2010-11-24 ENCOUNTER — Ambulatory Visit
Admission: RE | Admit: 2010-11-24 | Discharge: 2010-11-24 | Payer: Self-pay | Source: Home / Self Care | Attending: Internal Medicine | Admitting: Internal Medicine

## 2010-11-29 DIAGNOSIS — Z7901 Long term (current) use of anticoagulants: Secondary | ICD-10-CM | POA: Insufficient documentation

## 2010-11-29 DIAGNOSIS — Z86718 Personal history of other venous thrombosis and embolism: Secondary | ICD-10-CM

## 2010-11-29 DIAGNOSIS — D682 Hereditary deficiency of other clotting factors: Secondary | ICD-10-CM

## 2010-11-29 DIAGNOSIS — I82409 Acute embolism and thrombosis of unspecified deep veins of unspecified lower extremity: Secondary | ICD-10-CM

## 2010-12-07 ENCOUNTER — Ambulatory Visit: Admission: RE | Admit: 2010-12-07 | Discharge: 2010-12-07 | Payer: Self-pay | Source: Home / Self Care

## 2010-12-07 LAB — CONVERTED CEMR LAB: INR: 2.6

## 2010-12-08 NOTE — Assessment & Plan Note (Signed)
Summary: COU/VS  Anticoagulant Therapy Managed by: [name]. [name]  PharmD CACP [facility] Attending: Madaline Guthrie MD Indication 1: Deep vein thrombus Indication 2: Aftercare long term use Anticoagulants V58.61, V58.83 Start date: 03/25/2006 Duration: Indefinite  Patient Assessment Reviewed by: [name] PharmD  January 02, 2009 Medication review: verified warfarin dosage & schedule,verified previous prescription medications, verified doses & any changes, verified new medications, reviewed OTC medications, reviewed OTC health products-vitamins supplements etc Complications: none Dietary changes: none   Health status changes: none   Lifestyle changes: none   Recent/future hospitalizations: none   Recent/future procedures: none   Recent/future dental: none Patient Assessment Part 2:  Have you MISSED ANY DOSES or CHANGED TABLETS?  No missed Warfarin doses or changed tablets.  Have you had any BRUISING or BLEEDING ( nose or gum bleeds,blood in urine or stool)?  No reported bruising or bleeding in nose, gums, urine, stool.  Have you STARTED or STOPPED any MEDICATIONS, including OTC meds,herbals or supplements?  No other medications or herbal supplements were started or stopped.  Have you CHANGED your DIET, especially green vegetables,or ALCOHOL intake?  No changes in diet or alcohol intake.  Have you had any ILLNESSES or HOSPITALIZATIONS?  No reported illnesses or hospitalizations  Have you had any signs of CLOTTING?(chest discomfort,dizziness,shortness of breath,arms tingling,slurred speech,swelling or redness in leg)    No chest discomfort, dizziness, shortness of breath, tingling in arm, slurred speech, swelling, or redness in leg.     Treatment  Target INR: 2.0-3.0 INR: 3.5  Date: 01/02/2009 Regimen In:  35.0mg /week INR reflects regimen in: 3.5  New  Tablet strength: : 5mg  Regimen Out:     Sunday: 1 Tablet     Monday: 1 Tablet     Tuesday: 1 Tablet     Wednesday:  1/2 Tablet     Thursday: 1 Tablet      Friday: 1 Tablet     Saturday: 1 Tablet Total Weekly: 32.5mg /week mg  Next INR Due: 01/27/2009 Adjusted by: [name]. [name] PharmD CACP   Return to anticoagulation clinic:  01/13/2009 Time of next visit: 0900          Appended Document: COU/VS Return to clinic will be 22-Mar-10--NOT 8-Mar-10.

## 2010-12-08 NOTE — Assessment & Plan Note (Signed)
Summary: reassigned new to dr/cfb   Vital Signs:  Patient profile:   75 year old female Height:      65 inches (165.10 cm) Weight:      215.0 pounds (97.73 kg) BMI:     35.91 Temp:     97.3 degrees F oral Pulse rate:   77 / minute BP sitting:   145 / 74  (right arm)  Vitals Entered By: Chinita Pester RN (September 30, 2009 1:36 PM) CC: New to MD Is Patient Diabetic? No Pain Assessment Patient in pain? no      Nutritional Status BMI of > 30 = obese  Have you ever been in a relationship where you felt threatened, hurt or afraid?No   Does patient need assistance? Functional Status Self care Ambulation Normal   Primary Care Provider:  Zoila Shutter MD  CC:  New to MD.  History of Present Illness: Lovely 74 y old patient who comes in to establish primary care, as her previous physician has left. Her Norvasc was uncreased to 5mg  a day and her BP is better. She has recently seen her gyn Dr. Elana Alm and her insurance is not wanting to cover her Detrol LA so she is trying a couple other meds. We went through all preventive care and they are up to date. She got a flu shot 5 weeks ago at [facility].   Preventive Screening-Counseling & Management  Alcohol-Tobacco     Alcohol drinks/day: once or twice a year     Smoking Status: never  Caffeine-Diet-Exercise     Does Patient Exercise: no  Current Medications (verified): 1)  Caltrate 600+d  Tabs (Calcium Carbonate-Vitamin D Tabs) .... Take 1 Tablet By Mouth Two Times A Day 2)  Centrum  Tabs (Multiple Vitamins-Minerals) .... Take 1 Tablet By Mouth Once A Day 3)  Vitamin C  Tabs (Ascorbic Acid Tabs) .... Take 1 Tablet By Mouth Once A Day 4)  Famotidine 10 Mg Tabs (Famotidine) .... Take 1 Tablet By Mouth Once A Day 5)  Joint Support Formula   Caps (Glucos-Chondroit-Collag-Hyal) .... 2 Caps Two Times A Day 6)  Sudafed Pe Maximum Strength 10 Mg  Tabs (Phenylephrine Hcl) .... 2 Tabs Qid As Needed For Sinus Symptoms. 7)  Medium  Compression Hose   Misc (Elastic Bandages & Supports) .... Apply Daily in Am As Directed. 8)  Warfarin Sodium 5 Mg Tabs (Warfarin Sodium) .... Tablet Strength: 5mg  Take As Directed 9)  Fosamax 70 Mg Tabs (Alendronate Sodium) .... Take 1 Tablet By Mouth Once A Week 10)  D 1000 Plus  Tabs (Fa-Cyanocobalamin-B6-D-Ca) .... Take 1 Tablet By Mouth Once A Day 11)  Hydrochlorothiazide 25 Mg Tabs (Hydrochlorothiazide) .... Take 1 Tablet By Mouth Once A Day 12)  Pravastatin Sodium 20 Mg Tabs (Pravastatin Sodium) .... Take 1 Tablet By Mouth Once A Day 13)  Clobetasol Propionate 0.05 % Crea (Clobetasol Propionate) .... Apply To Affected Area Two Times A Day 14)  Amlodipine Besylate 5 Mg Tabs (Amlodipine Besylate) .... Take 1 Tablet By Mouth Once A Day 15)  Detrol La 4 Mg Xr24h-Cap (Tolterodine Tartrate)  Allergies (verified): 1)  ! Sulfa 2)  Pcn  Past History:  Past Medical History: Reviewed history from 02/28/2008 and no changes required.  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  2.  Pulmonary embolus.  3.  History of cervical cancer in 1983, recurrence January 2006, status post  radiation therapy, partial hysterectomy and colposcopy in 1983, and      upper vaginectomy in January 2006.  4.  Osteoporosis.  5.  Chronic sinusitis.  6.  Stress incontinence.  7.  Hernia repair.  8.  Nasal septum repair.  9.  Hand and shoulder repair.  10. Status post colonoscopy in 2001 that was normal.  11. Hypertension while in the hospital.  Past Surgical History: Reviewed history from 02/12/2008 and no changes required. Cleft lip repair  [facility], [place], [place] at 6 wks of age) -  1942, @ age 71, repeat Tubal ligation-1976 Adenocarcinoma of Endocervix  -  Radium implants= Dec 1983, Dr. Derrell Lolling and Dr. Addison Bailey) -  Radiation Tx x 15 at [facility] 1984 -  Removal of Cervix and repair of vagina- 2006 Arthroscopy of Left knee for  torn cartilage- 1986 Hammertoe repair (3 toes each foot)- 1990-91  Family History: Reviewed history from 02/28/2008 and no changes required. Maternal aunt with breast cancer. No known coagulopathic disease in family members.  Social History: Reviewed history from 07/02/2008 and no changes required. 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 CV:  Denies chest pain or discomfort, palpitations, and shortness of breath with exertion. Resp:  Denies cough and shortness of breath.  Physical Exam  General:  alert and well-developed.  alert and well-developed.   Head:  normocephalic and atraumatic.  normocephalic and atraumatic.   Nose:  nose piercing noted.  nose piercing noted.   Neck:  supple and full ROM.  supple and full ROM.     Complete Medication List: 1)  Caltrate 600+d Tabs (Calcium carbonate-vitamin d tabs) .... Take 1 tablet by mouth two times a day 2)  Centrum Tabs (Multiple vitamins-minerals) .... Take 1 tablet by mouth once a day 3)  Vitamin C Tabs (Ascorbic acid tabs) .... Take 1 tablet by mouth once a day 4)  Famotidine 10 Mg Tabs (Famotidine) .... Take 1 tablet by mouth once a day 5)  Joint Support Formula Caps (Glucos-chondroit-collag-hyal) .... 2 caps two times a day 6)  Sudafed Pe Maximum Strength 10 Mg Tabs (Phenylephrine hcl) .... 2 tabs qid as needed for sinus symptoms. 7)  Medium Compression Hose Misc (elastic Bandages & Supports)  .... Apply daily in am as directed. 8)  Warfarin Sodium 5 Mg Tabs (Warfarin sodium) .... Tablet strength: 5mg  take as directed 9)  Fosamax 70 Mg Tabs (Alendronate sodium) .... Take 1 tablet by mouth once a week 10)   D 1000 Plus Tabs (Fa-cyanocobalamin-b6-d-ca) .... Take 1 tablet by mouth once a day 11)  Hydrochlorothiazide 25 Mg Tabs (Hydrochlorothiazide) .... Take 1 tablet by mouth once a day 12)  Pravastatin Sodium 20 Mg Tabs (Pravastatin sodium) .... Take 1 tablet by mouth once a day 13)  Clobetasol Propionate 0.05 % Crea (Clobetasol propionate) .... Apply to affected area two times a day 14)  Amlodipine Besylate 5 Mg Tabs (Amlodipine besylate) .... Take 1 tablet by mouth once a day 15)  Detrol La 4 Mg Xr24h-cap (Tolterodine tartrate)  Other Orders: T-Protime, Auto ([id number])  Patient Instructions: 1)  Please schedule a follow-up appointment in 6 months. 2)  Please come fasting  when you come next time. 3)  It is important that you exercise regularly. YOU CAN DO IT!!! 4)  I wish you happy holidays. 5)  We will get your PT/INR level to [name] and you should hear back from him about that.  Prevention & Chronic Care Immunizations   Influenza vaccine: Not documented    Tetanus booster: Not documented    Pneumococcal vaccine: Not documented    H. zoster vaccine: 07/02/2008: Zostavax  Colorectal Screening   Hemoccult: negative x 3  (03/21/2008)   Hemoccult due: 03/21/2009    Colonoscopy: Results: Hemorrhoids.       (10/11/2000)   Colonoscopy due: 10/2005  Other Screening   Pap smear: Not documented    Mammogram: No specific mammographic evidence of malignancy.  Assessment: BIRADS 1. Location: [facility].    (08/15/2008)   Mammogram action/deferral: Screening mammogram in 1 year.     (08/15/2008)   Mammogram due: 08/2009    DXA bone density scan:  Lumbar Spine:  T Score-2.5 to -1.0 Spine (T Score -2.1).   Hip Total: T Score > -1.0 Hip (T Score -0.4).  Location:  [facility].     (05/01/2008)   Smoking status: never  (09/30/2009)  Lipids   Total Cholesterol: 205  (12/24/2008)   LDL: 98  (12/24/2008)   LDL Direct: Not documented    HDL: 70  (12/24/2008)   Triglycerides: 183  (12/24/2008)    SGOT (AST): 18  (12/24/2008)   SGPT (ALT): 13  (12/24/2008)   Alkaline phosphatase: 81  (12/24/2008)   Total bilirubin: 0.2  (12/24/2008)  Hypertension   Last Blood Pressure: 145 / 74  (09/30/2009)   Serum creatinine: 0.89  (06/19/2009)   Serum potassium 4.6  (06/19/2009)  Self-Management Support :    Patient will work on the following items until the next clinic visit to reach self-care goals:     Medications and monitoring: check my blood pressure  (09/30/2009)     Eating: eat baked foods instead of fried foods  (09/30/2009)    Hypertension self-management support: Education handout  (09/30/2009)   Hypertension education handout printed    Lipid self-management support: Not documented   Process Orders Check Orders Results:     [facility]: Check successful Tests Sent for requisitioning (October 08, 2009 4:15 PM):     09/30/2009: [facility] -- T-Protime, Auto [phone] (signed)

## 2010-12-08 NOTE — Progress Notes (Signed)
  Phone Note Call from Patient   Caller: Patient Call For: Hulen Luster PharmD CACP Reason for Call: Lab or Test Results Action Taken: Phone Call Completed, Provider Notified, Appt Scheduled Details for Reason: Called by patient indicating that she had INR performed the Tuesday before Thanksgiving and wished to know her new instructions relative to the INR of 2.39 Details of Complaint: No Complaints. Details of Action Taken: Patient will continue upon same regimen of warfarin and RTC on 3-Jan-11 at 0930h. I alerted patient of this information. Initial call taken by: Hulen Luster PharmD CACP

## 2010-12-08 NOTE — Assessment & Plan Note (Signed)
Summary: COU/CH  Anticoagulant Therapy Managed by: [name]. [name]  PharmD CACP OPC Attending: Ned Grace MD Indication 1: Deep vein thrombus Indication 2: Aftercare long term use Anticoagulants V58.61, V58.83 Start date: 03/25/2006 Duration: Indefinite  Patient Assessment Reviewed by: [name] PharmD  November 11, 2008 Medication review: verified warfarin dosage & schedule,verified previous prescription medications, verified doses & any changes, verified new medications, reviewed OTC medications, reviewed OTC health products-vitamins supplements etc Complications: none Dietary changes: none   Health status changes: none   Lifestyle changes: none   Recent/future hospitalizations: none   Recent/future procedures: none   Recent/future dental: none Patient Assessment Part 2:  Have you MISSED ANY DOSES or CHANGED TABLETS?  No missed Warfarin doses or changed tablets.  Have you had any BRUISING or BLEEDING ( nose or gum bleeds,blood in urine or stool)?  No reported bruising or bleeding in nose, gums, urine, stool.  Have you STARTED or STOPPED any MEDICATIONS, including OTC meds,herbals or supplements?  No other medications or herbal supplements were started or stopped.  Have you CHANGED your DIET, especially green vegetables,or ALCOHOL intake?  No changes in diet or alcohol intake.  Have you had any ILLNESSES or HOSPITALIZATIONS?  No reported illnesses or hospitalizations  Have you had any signs of CLOTTING?(chest discomfort,dizziness,shortness of breath,arms tingling,slurred speech,swelling or redness in leg)    No chest discomfort, dizziness, shortness of breath, tingling in arm, slurred speech, swelling, or redness in leg.     Treatment  Target INR: 2.0-3.0 INR: 4.3  Date: 11/11/2008 Regimen In:  35.0mg /week INR reflects regimen in: 4.3  New  Tablet strength: : 5mg  Regimen Out:     Sunday: 1 Tablet     Monday: 1 Tablet     Tuesday: 1 Tablet     Wednesday: 1/2  Tablet     Thursday: 1 Tablet      Friday: 1 Tablet     Saturday: 1 Tablet Total Weekly: 32.5mg /week mg  Next INR Due: 12/02/2008 Adjusted by: [name]. [name] PharmD CACP   Return to anticoagulation clinic:  12/02/2008 Time of next visit: 0915  Hold:  1 Days

## 2010-12-08 NOTE — Therapy (Signed)
Summary: Audiology  Audiology   Imported By: Elenor Legato 10/05/2010 08:38:36  _____________________________________________________________________  External Attachment:    Type:   Image     Comment:   External Document

## 2010-12-08 NOTE — Assessment & Plan Note (Signed)
Summary: COU/VS  Anticoagulant Therapy Managed by: [name]. [name]  PharmD CACP OPC Attending: Madaline Guthrie MD Indication 1: Deep vein thrombus Indication 2: Aftercare long term use Anticoagulants V58.61, V58.83 Start date: 03/25/2006 Duration: Indefinite  Patient Assessment Reviewed by: [name] PharmD  June 16, 2009 Medication review: verified warfarin dosage & schedule,verified previous prescription medications, verified doses & any changes, verified new medications, reviewed OTC medications, reviewed OTC health products-vitamins supplements etc Complications: none Dietary changes: none   Health status changes: none   Lifestyle changes: none   Recent/future hospitalizations: none   Recent/future procedures: none   Recent/future dental: none Patient Assessment Part 2:  Have you MISSED ANY DOSES or CHANGED TABLETS?  No missed Warfarin doses or changed tablets.  Have you had any BRUISING or BLEEDING ( nose or gum bleeds,blood in urine or stool)?  No reported bruising or bleeding in nose, gums, urine, stool.  Have you STARTED or STOPPED any MEDICATIONS, including OTC meds,herbals or supplements?  No other medications or herbal supplements were started or stopped.  Have you CHANGED your DIET, especially green vegetables,or ALCOHOL intake?  No changes in diet or alcohol intake.  Have you had any ILLNESSES or HOSPITALIZATIONS?  No reported illnesses or hospitalizations  Have you had any signs of CLOTTING?(chest discomfort,dizziness,shortness of breath,arms tingling,slurred speech,swelling or redness in leg)    No chest discomfort, dizziness, shortness of breath, tingling in arm, slurred speech, swelling, or redness in leg.     Treatment  Target INR: 2.0-3.0 INR: 2.0  Date: 06/16/2009 Regimen In:  30.0mg /week INR reflects regimen in: 2.0  New  Tablet strength: : 5mg  Regimen Out:     Sunday: 1 Tablet     Monday: 1 Tablet     Tuesday: 1 Tablet     Wednesday: 1/2  Tablet     Thursday: 1 Tablet      Friday: 1 Tablet     Saturday: 1 Tablet Total Weekly: 32.5mg /week mg  Next INR Due: 07/21/2009 Adjusted by: [name]. [name] PharmD CACP   Return to anticoagulation clinic:  07/21/2009 Time of next visit: 1000    Allergies: 1)  ! Sulfa 2)  Pcn

## 2010-12-08 NOTE — Assessment & Plan Note (Signed)
Summary: est-ck/fu/meds/cfb   Vital Signs:  Patient profile:   75 year old female Height:      65 inches (165.10 cm) Weight:      210.6 pounds (95.73 kg) BMI:     35.17 Temp:     96.1 degrees F (35.61 degrees C) oral Pulse rate:   81 / minute BP sitting:   145 / 79  (left arm) Cuff size:   large  Vitals Entered By: Cynda Familia Duncan Dull) (July 07, 2010 8:31 AM) CC: routine f/u Is Patient Diabetic? No Pain Assessment Patient in pain? no      Nutritional Status BMI of > 30 = obese  Have you ever been in a relationship where you felt threatened, hurt or afraid?No   Does patient need assistance? Functional Status Self care Ambulation Normal   Primary Care Rainn Zupko:  Zoila Shutter MD  CC:  routine f/u.  History of Present Illness: 75 yr old who comes in a bit overdue for 6 month follow up. She has a hx of HTN, hyperlipidemia, factor V deficiciency s/p PE, dvt on dchronic anti-coagulation, osteoporosis on fosamax and is here for follow up of these problems.  I had asked her last time to try to exercise. She has not been able to because of her hips and legs. She forgot to come fasting today.  She wants flu vaccine.  As we talk a little more she tells me that people have been worried, notably her husband, because she will get winded very easily. She occasionally gets diaphoretic when this happens, but denies nausea or chest pain. She attributes it to the fact that her activity seems to have steadily decreased because of the pain in her legs and weight bearing joints.  She brings in BP checks from home done with a wrist monitor. Most of the systolic BP's are too high.  She wears control hose all day but they are causing damage to her toes. She says she has pairs at home without toes but they are too tight.  She also mentions all of the trouble that she has had with her meds for urinary incontinence. She had been on detrol LA for years with no problem. Then all of the  sudden her insurance wouldn't cover it. Then she tried another drug that caused dizziness, now she is on vesicare that at first caused such severe constipation that she had an ED visit and had to call a plumber after the toilet got stopped up. She is now on docusate sodium and metamucil and doing o.k. I mentioned mirilax to her but she is afraid to change anything at this point because of what "bad shape" she was in.  Preventive Screening-Counseling & Management  Alcohol-Tobacco     Alcohol drinks/day: once or twice a year     Smoking Status: never  Current Medications (verified): 1)  Caltrate 600+d  Tabs (Calcium Carbonate-Vitamin D Tabs) .... Take 1 Tablet By Mouth Two Times A Day 2)  Centrum  Tabs (Multiple Vitamins-Minerals) .... Take 1 Tablet By Mouth Once A Day 3)  Vitamin C  Tabs (Ascorbic Acid Tabs) .... Take 1 Tablet By Mouth Once A Day 4)  Famotidine 10 Mg Tabs (Famotidine) .... Take 1 Tablet By Mouth Once A Day 5)  Joint Support Formula   Caps (Glucos-Chondroit-Collag-Hyal) .... 2 Caps Two Times A Day 6)  Sudafed Pe Maximum Strength 10 Mg  Tabs (Phenylephrine Hcl) .... 2 Tabs Qid As Needed For Sinus Symptoms. 7)  Medium  Compression Hose   Misc (Elastic Bandages & Supports) .... Apply Daily in Am As Directed. 8)  Warfarin Sodium 5 Mg Tabs (Warfarin Sodium) .... Tablet Strength: 5mg  Take As Directed 9)  Fosamax 70 Mg Tabs (Alendronate Sodium) .... Take 1 Tablet By Mouth Once A Week 10)  D 1000 Plus  Tabs (Fa-Cyanocobalamin-B6-D-Ca) .... Take 1 Tablet By Mouth Once A Day 11)  Hydrochlorothiazide 25 Mg Tabs (Hydrochlorothiazide) .... Take 1 Tablet By Mouth Once A Day 12)  Pravastatin Sodium 20 Mg Tabs (Pravastatin Sodium) .... Take 1 Tablet By Mouth Once A Day 13)  Clobetasol Propionate 0.05 % Crea (Clobetasol Propionate) .... Apply To Affected Area Two Times A Day 14)  Amlodipine Besylate 10 Mg Tabs (Amlodipine Besylate) .... One A Day 15)  Vesicare 10 Mg Tabs (Solifenacin Succinate)  .... One A Day 16)  Metamucil 30.9 % Powd (Psyllium) .Marland Kitchen.. 1 Teaspoon in 8 Oz Water A Day  Allergies: 1)  ! Sulfa 2)  Pcn  Past History:  Past Medical History: Reviewed history from 02/28/2008 and no changes required.  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  2.  Pulmonary embolus.  3.  History of cervical cancer in 1983, recurrence January 2006, status post      radiation therapy, partial hysterectomy and colposcopy in 1983, and      upper vaginectomy in January 2006.  4.  Osteoporosis.  5.  Chronic sinusitis.  6.  Stress incontinence.  7.  Hernia repair.  8.  Nasal septum repair.  9.  Hand and shoulder repair.  10. Status post colonoscopy in 2001 that was normal.  11. Hypertension while in the hospital.  Past Surgical History: Reviewed history from 02/12/2008 and no changes required. Cleft lip repair  [facility], [place], [place] at 6 wks of age) -  1942, @ age 87, repeat Tubal ligation-1976 Adenocarcinoma of Endocervix  -  Radium implants= Dec 1983, Dr. Derrell Lolling and Dr. Addison Bailey) -  Radiation Tx x 15 at [facility] 1984 -  Removal of Cervix and repair of vagina- 2006 Arthroscopy of Left knee for torn cartilage- 1986 Hammertoe repair (3 toes each foot)- 1990-91  Family History: Reviewed history from 02/28/2008 and no changes required. Maternal aunt with breast cancer. No known coagulopathic disease in family members.  Social History: Reviewed history from 07/02/2008 and no changes required. 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   Head:  normocephalic and atraumatic.  s/p  cleft lip repair Eyes:  vision grossly intact.   Neck:  supple and full ROM.   Lungs:  normal respiratory effort and normal breath sounds.   Heart:  normal rate, regular rhythm, and no murmur.   Abdomen:  soft, non-tender, and normal bowel sounds.   Extremities:  no edema, patients toes are erythematous R>L foot, her pulses are excellent, no area of breakdown Psych:  Oriented X3 and memory intact for recent and remote.     Impression & Recommendations:  Problem # 1:  HYPERTENSION (ICD-401.9) Her BP is not adequately controlled. Will increase amlodipine to 10mg   once a day. Her updated medication list for this problem includes:    Hydrochlorothiazide 25 Mg Tabs (Hydrochlorothiazide) .Marland Kitchen... Take 1 tablet by mouth once a day    Amlodipine Besylate 10 Mg Tabs (Amlodipine besylate) ..... One a day  Orders: T-Comprehensive Metabolic Panel ([id number])  Problem # 2:  SHORTNESS OF BREATH (ICD-786.05) Patient's shortness of breath is obviously concerning. Her EKG I read and it shows: NSR, normal EKG. I think that her SOB is most likely due to deconditioning and have asked her to go out and get some exercise. I suspect that her symptoms will improve and I plan to see her back soon. She is to let me know right away if her symptoms worsen with activity. She does have risk factors for coronary artery disease: HTN, hyperlipidemia, inactivity, post-menopausal. Orders: 12 Lead EKG (12 Lead EKG)  Problem # 3:  HYPERLIPIDEMIA (ICD-272.4) Patient again forgot to come fasting, but have gone ahead and checked labs as they have not been checked since 3/10. A direct LDL should be accurate though she is not fasting as should HDL and total cholesterol. Her updated medication list for this problem includes:    Pravastatin Sodium 20 Mg Tabs (Pravastatin sodium) .Marland Kitchen... Take 1 tablet by mouth once a day  Orders: T-Lipid Profile ([id number]) T-Lipoprotein (LDL cholesterol)  ([id number]) T-Comprehensive Metabolic  Panel ([id number])  Problem # 4:  FACTOR V DEFICIENCY (ICD-286.3) Continue chronic anti-coagulation and follow up in Dr. Saralyn Pilar clinic.  Problem # 5:  OSTEOPOROSIS (ICD-733.00) It has been over 2 years since the last bone density was checked on medication so we will order this. Her updated medication list for this problem includes:    Fosamax 70 Mg Tabs (Alendronate sodium) .Marland Kitchen... Take 1 tablet by mouth once a week  Problem # 6:  TOE PAIN (ICD-729.5) i think she would benefit from wearing hercompression hose less or at least hose without her toes enclosed. My nurse, Purnell Shoemaker is working on getting an alternative pair which may be less tight than the pair she has. Will follow.  Problem # 7:  Preventive Health Care (ICD-V70.0) Flu shot given today. She has had pneumovax, need to find and document. Bone density ordered today as well as mammagram which will be due 10/11.  Complete Medication List: 1)  Caltrate 600+d Tabs (Calcium carbonate-vitamin d tabs) .... Take 1 tablet by mouth two times a day 2)  Centrum Tabs (Multiple vitamins-minerals) .... Take 1 tablet by mouth once a day 3)  Vitamin C Tabs (Ascorbic acid tabs) .... Take 1 tablet by mouth once a day 4)  Famotidine 10 Mg Tabs (Famotidine) .... Take 1 tablet by mouth once a day 5)  Joint Support Formula Caps (Glucos-chondroit-collag-hyal) .... 2 caps two times a day 6)  Sudafed Pe Maximum Strength 10 Mg Tabs (Phenylephrine hcl) .... 2 tabs qid as needed for sinus symptoms. 7)  Medium Compression Hose Misc (elastic Bandages & Supports)  .... Apply daily in am as directed. 8)  Warfarin Sodium 5 Mg Tabs (Warfarin sodium) .... Tablet strength: 5mg  take as directed 9)  Fosamax 70 Mg Tabs (Alendronate sodium) .... Take 1 tablet by mouth once a week 10)  D 1000 Plus Tabs (Fa-cyanocobalamin-b6-d-ca) .... Take 1 tablet by mouth once a day 11)  Hydrochlorothiazide 25 Mg Tabs (Hydrochlorothiazide) .... Take 1 tablet by mouth once a day 12)   Pravastatin Sodium 20 Mg Tabs (Pravastatin sodium) .... Take 1 tablet by mouth once a day 13)  Clobetasol Propionate 0.05 %  Crea (Clobetasol propionate) .... Apply to affected area two times a day 14)  Amlodipine Besylate 10 Mg Tabs (Amlodipine besylate) .... One a day 15)  Vesicare 10 Mg Tabs (Solifenacin succinate) .... One a day 16)  Metamucil 30.9 % Powd (Psyllium) .Marland Kitchen.. 1 teaspoon in 8 oz water a day  Other Orders: Dexa scan (Dexa scan) Influenza Vaccine MCR ([id number])  Patient Instructions: 1)  Please schedule a follow-up appointment in 6 weeks. 2)  Please start some aerobic exercise as we discussed. 3)  I am going up on your Amlodipine from 5mg  to 10mg  once a day. 4)  We are giving you your flu shot today. 5)  I hope the hose without toes get more comfortable. Prescriptions: AMLODIPINE BESYLATE 10 MG TABS (AMLODIPINE BESYLATE) one a day  #31 x 11   Entered and Authorized by:   Zoila Shutter MD   Signed by:   Zoila Shutter MD on 07/07/2010   Method used:   [facility] to        [facility]  [address] [phone]* (retail)       [address]       [place], [place]  [address]       Ph: [phone]       Fax: [phone]   RxID:   [id number]  Process Orders Check Orders Results:     [facility]: Check successful Tests Sent for requisitioning (July 07, 2010 2:09 PM):     07/07/2010: [facility] -- T-Lipid Profile [id number] (signed)     07/07/2010: [facility] -- T-Lipoprotein (LDL cholesterol)  [phone] (signed)     07/07/2010: [facility] -- T-Comprehensive Metabolic Panel [id number] (signed)      Immunizations Administered:  Influenza Vaccine # 1:    Vaccine Type: Fluvax MCR    Site: left deltoid    Mfr: GlaxoSmithKline    Dose: 0.5 ml    Route: IM    Given by: Cynda Familia (AAMA)    Exp. Date: 05/08/2011    Lot #: [id number]    VIS given: 06/01/07 version  given July 07, 2010.  Flu Vaccine Consent Questions:    Do you have a history of severe allergic reactions to this vaccine? no    Any prior history of allergic reactions to egg and/or gelatin? no    Do you have a sensitivity to the preservative Thimersol? no    Do you have a past history of Guillan-Barre Syndrome? no    Do you currently have an acute febrile illness? no    Have you ever had a severe reaction to latex? no    Vaccine information given and explained to patient? yes    Are you currently pregnant? no     Prevention & Chronic Care Immunizations   Influenza vaccine: Fluvax MCR  (07/07/2010)    Tetanus booster: Not documented    Pneumococcal vaccine: Not documented    H. zoster vaccine: 07/02/2008: Zostavax  Colorectal Screening   Hemoccult: negative x 3  (03/21/2008)   Hemoccult due: 03/21/2009    Colonoscopy: Results: Hemorrhoids.       (10/11/2000)   Colonoscopy due: 10/2005  Other Screening   Pap smear: Not documented    Mammogram: No specific mammographic evidence of malignancy.  Assessment: BIRADS 1. Location: [facility].    (08/15/2008)   Mammogram action/deferral: Screening mammogram in 1 year.     (08/15/2008)  Mammogram due: 08/2009    DXA bone density scan:  Lumbar Spine:  T Score-2.5 to -1.0 Spine (T Score -2.1).   Hip Total: T Score > -1.0 Hip (T Score -0.4).  Location:  [facility].     (05/01/2008)   DXA bone density action/deferral: Ordered  (07/07/2010)   Smoking status: never  (07/07/2010)  Lipids   Total Cholesterol: 205  (12/24/2008)   Lipid panel action/deferral: LDL Direct Ordered   LDL: 98  (12/24/2008)   LDL Direct: Not documented   HDL: 70  (12/24/2008)   Triglycerides: 183  (12/24/2008)    SGOT (AST): 18  (12/24/2008)   SGPT (ALT): 13  (12/24/2008) CMP ordered    Alkaline phosphatase: 81  (12/24/2008)   Total bilirubin: 0.2  (12/24/2008)    Lipid flowsheet reviewed?: Yes    Progress toward LDL goal: Improved  Hypertension   Last Blood Pressure: 145 / 79  (07/07/2010)   Serum creatinine: 0.89  (06/19/2009)   Serum potassium 4.6  (06/19/2009) CMP ordered   Self-Management Support :    Hypertension self-management support: Education handout  (09/30/2009)    Lipid self-management support: Not documented    Nursing Instructions: Give Flu vaccine today Schedule screening DXA bone density scan (see order)

## 2010-12-08 NOTE — Assessment & Plan Note (Signed)
    Bone Density  Procedure date:  05/01/2008  Findings:       Lumbar Spine:  T Score-2.5 to -1.0 Spine (T Score -2.1).   Hip Total: T Score > -1.0 Hip (T Score -0.4).  Location:  [facility].     Comments:      Assessment:  Osteopenia.

## 2010-12-08 NOTE — Progress Notes (Signed)
Summary: refill/gg  Phone Note Refill Request  on September 08, 2010 11:21 AM  Refills Requested: Medication #1:  HYDROCHLOROTHIAZIDE 25 MG TABS Take 1 tablet by mouth once a day  Method Requested: Electronic Initial call taken by: Merrie Roof RN,  September 08, 2010 11:22 AM    Prescriptions: HYDROCHLOROTHIAZIDE 25 MG TABS (HYDROCHLOROTHIAZIDE) Take 1 tablet by mouth once a day  #30 Tablet x 11   Entered and Authorized by:   Zoila Shutter MD   Signed by:   Zoila Shutter MD on 09/08/2010   Method used:   [facility]  [address] [phone]* (retail)       [address]       [place], [place]  [address]       Ph: [phone]       Fax: [phone]   RxID:   [id number]

## 2010-12-08 NOTE — Assessment & Plan Note (Signed)
Summary: COU/VS  Anticoagulant Therapy Managed by: [name]. Janie Morning  PharmD CACP OPC Attending: Sampson Goon MD, Onalee Hua Indication 1: Deep vein thrombus Indication 2: Aftercare long term use Anticoagulants V58.61, V58.83 Start date: 03/25/2006 Duration: Indefinite  Patient Assessment Reviewed by: [name] PharmD  May 19, 2009 Medication review: verified warfarin dosage & schedule,verified previous prescription medications, verified doses & any changes, verified new medications, reviewed OTC medications, reviewed OTC health products-vitamins supplements etc Complications: none Dietary changes: none   Health status changes: none   Lifestyle changes: none   Recent/future hospitalizations: none   Recent/future procedures: none   Recent/future dental: none Patient Assessment Part 2:  Have you MISSED ANY DOSES or CHANGED TABLETS?  No missed Warfarin doses or changed tablets.  Have you had any BRUISING or BLEEDING ( nose or gum bleeds,blood in urine or stool)?  No reported bruising or bleeding in nose, gums, urine, stool.  Have you STARTED or STOPPED any MEDICATIONS, including OTC meds,herbals or supplements?  No other medications or herbal supplements were started or stopped.  Have you CHANGED your DIET, especially green vegetables,or ALCOHOL intake?  No changes in diet or alcohol intake.  Have you had any ILLNESSES or HOSPITALIZATIONS?  No reported illnesses or hospitalizations  Have you had any signs of CLOTTING?(chest discomfort,dizziness,shortness of breath,arms tingling,slurred speech,swelling or redness in leg)    No chest discomfort, dizziness, shortness of breath, tingling in arm, slurred speech, swelling, or redness in leg.     Treatment  Target INR: 2.0-3.0 INR: 2.1  Date: 05/19/2009 Regimen In:  27.5mg /week INR reflects regimen in: 2.1  New  Tablet strength: : 5mg  Regimen Out:     Sunday: 1 Tablet     Monday: 1 Tablet     Tuesday: 1 Tablet     Wednesday: 0  Tablet     Thursday: 1 Tablet      Friday: 1 Tablet     Saturday: 1 Tablet Total Weekly: 30.0mg /week mg  Next INR Due: 06/16/2009 Adjusted by: [name]. [name] PharmD CACP   Return to anticoagulation clinic:  06/16/2009 Time of next visit: 1015    Allergies: 1)  ! Sulfa 2)  Pcn

## 2010-12-08 NOTE — Progress Notes (Signed)
Summary: med refill/gp  Phone Note Refill Request Message from:  Fax from Pharmacy on December 15, 2009 11:18 AM  Refills Requested: Medication #1:  HYDROCHLOROTHIAZIDE 25 MG TABS Take 1 tablet by mouth once a day   Last Refilled: 11/08/2009 Last appt. 08/30/09.  Last BMP 06/19/09.   Method Requested: Electronic Initial call taken by: Chinita Pester RN,  December 15, 2009 11:19 AM    Prescriptions: HYDROCHLOROTHIAZIDE 25 MG TABS (HYDROCHLOROTHIAZIDE) Take 1 tablet by mouth once a day  #30 x 5   Entered and Authorized by:   Zoila Shutter MD   Signed by:   Zoila Shutter MD on 12/15/2009   Method used:   [facility]  [address] [phone]* (retail)       [address]       [place], [place]  [address]       Ph: [phone]       Fax: [phone]   RxID:   [id number]

## 2010-12-08 NOTE — Assessment & Plan Note (Signed)
Summary: 261/ds  Anticoagulant Therapy Managed by: [name]. Janie Morning  PharmD CACP OPC Attending: [name], Levada Schilling Indication 1: Deep vein thrombus Indication 2: Aftercare long term use Anticoagulants V58.61, V58.83 Start date: 03/25/2006 Duration: Indefinite  Patient Assessment Reviewed by: [name] PharmD  September 01, 2009 Medication review: verified warfarin dosage & schedule,verified previous prescription medications, verified doses & any changes, verified new medications, reviewed OTC medications, reviewed OTC health products-vitamins supplements etc Complications: none Dietary changes: none   Health status changes: none   Lifestyle changes: none   Recent/future hospitalizations: none   Recent/future procedures: none   Recent/future dental: none Patient Assessment Part 2:  Have you MISSED ANY DOSES or CHANGED TABLETS?  No missed Warfarin doses or changed tablets.  Have you had any BRUISING or BLEEDING ( nose or gum bleeds,blood in urine or stool)?  No reported bruising or bleeding in nose, gums, urine, stool.  Have you STARTED or STOPPED any MEDICATIONS, including OTC meds,herbals or supplements?  No other medications or herbal supplements were started or stopped.  Have you CHANGED your DIET, especially green vegetables,or ALCOHOL intake?  No changes in diet or alcohol intake.  Have you had any ILLNESSES or HOSPITALIZATIONS?  No reported illnesses or hospitalizations  Have you had any signs of CLOTTING?(chest discomfort,dizziness,shortness of breath,arms tingling,slurred speech,swelling or redness in leg)    No chest discomfort, dizziness, shortness of breath, tingling in arm, slurred speech, swelling, or redness in leg.     Treatment  Target INR: 2.0-3.0 INR: 2.3  Date: 09/01/2009 Regimen In:  27.5mg /week INR reflects regimen in: 2.3  New  Tablet strength: : 5mg  Regimen Out:     Sunday: 1 Tablet     Monday: 1 Tablet     Tuesday: 1 Tablet  Wednesday: 1/2 Tablet     Thursday: 1 Tablet      Friday: 1 Tablet     Saturday: 1 Tablet Total Weekly: 32.5mg /week mg  Next INR Due: 09/29/2009 Adjusted by: [name]. [name] PharmD CACP   Return to anticoagulation clinic:  09/29/2009 Time of next visit: 0930    Allergies: 1)  ! Sulfa 2)  Pcn

## 2010-12-08 NOTE — Assessment & Plan Note (Signed)
Summary: foot wound/gg   Vital Signs:  Patient Profile:   75 Years Old Female Height:     65 inches (165.10 cm) Weight:      215.2 pounds (97.82 kg) BMI:     35.94 Temp:     97.6 degrees F (36.44 degrees C) oral Pulse rate:   98 / minute BP sitting:   154 / 81  (left arm)  Pt. in pain?   yes    Location:   left akle    Intensity:   2  Vitals Entered By: Krystal Eaton Duncan Dull) (February 12, 2008 1:59 PM)              Is Patient Diabetic? No Nutritional Status BMI of > 30 = obese  Have you ever been in a relationship where you felt threatened, hurt or afraid?No   Does patient need assistance? Functional Status Self care Ambulation Normal     Chief Complaint:  c/o "wound" inside of left ankle .  History of Present Illness: This is a 75 year old woman with a past medical history of Bilateral DVT's (acute and chronic) with pulmonary embolus on chronic coumadin therapy for life, who also has a history of cervical cancer followed by Dr Lelon Perla, in remission, and factor V Leiden heterozygous mutation.    She has not been seen in our office since March 2008. She is seen on a regular basis by her gynecologist, Dr. Lelon Perla. She was seen by Dr. Lorel Monaco last visit, and has been assigned to Dr. Harvie Junior. Her next appointment is next month. She is not sure what screening tests for health maintenance she has had, except for her mammogram.   Today, she is here because of a small ulcer with scar on her left medial malleolus. It has been there for about a week, and she does not remember any trauma preceding it. She has several spider angiomas on her leg, and has varicosities as well on both legs. She says that it drained a minimal amount, and is not very painful. She is going to [place] for 1-2 wks and wanted to check it out before going. She says that her appt is in about 5 weeks.       Prior Medication List:  ACTONEL 35 MG TABS (RISEDRONATE SODIUM) Take 1 tablet by mouth once a day DETROL LA 4  MG CP24 (TOLTERODINE TARTRATE) Take 1 tablet by mouth once a day FEXOFENADINE HCL 180 MG TABS (FEXOFENADINE HCL) Take 1 tablet by mouth once a day CALTRATE 600+D  TABS (CALCIUM CARBONATE-VITAMIN D TABS) Take 1 tablet by mouth two times a day CENTRUM  TABS (MULTIPLE VITAMINS-MINERALS) Take 1 tablet by mouth once a day VITAMIN C  TABS (ASCORBIC ACID TABS) Take 1 tablet by mouth once a day FAMOTIDINE 10 MG TABS (FAMOTIDINE) Take 1 tablet by mouth once a day WARFARIN SODIUM  TABS (WARFARIN SODIUM TABS) Tablet Strength: 5mg  Take as directed   Updated Prior Medication List: ACTONEL 35 MG TABS (RISEDRONATE SODIUM) Take 1 tablet by mouth once a day DETROL LA 4 MG CP24 (TOLTERODINE TARTRATE) Take 1 tablet by mouth once a day FEXOFENADINE HCL 180 MG TABS (FEXOFENADINE HCL) Take 1 tablet by mouth once a day CALTRATE 600+D  TABS (CALCIUM CARBONATE-VITAMIN D TABS) Take 1 tablet by mouth two times a day CENTRUM  TABS (MULTIPLE VITAMINS-MINERALS) Take 1 tablet by mouth once a day VITAMIN C  TABS (ASCORBIC ACID TABS) Take 1 tablet by mouth once a day FAMOTIDINE 10  MG TABS (FAMOTIDINE) Take 1 tablet by mouth once a day WARFARIN SODIUM  TABS (WARFARIN SODIUM TABS) Tablet Strength: 5mg  Take as directed  Current Allergies (reviewed today): ! SULFA PCN  Past Medical History:    Reviewed history from 01/16/2007 and no changes required:       Cervical cancer, hx of, Dr Lelon Perla       Pulmonary embolism, hx of       DVT, chronic       Heterozygous for factor V Leiden mutation       Chronic sinusitis       Osteoporosis       Hypertension       Stress incontinence    Risk Factors: Tobacco use:  never    Physical Exam  General:     alert, well-developed, and well-nourished.  Pleasant white female in no distress.  Extremities:     There is a small lesion on her left medial malleolus which appears to be minimally ulcerated with scab forming over it. There is no active drainage, but appears to have  drained recently. She has a minimal area of white surrounding this 1 cm lesion and an area of erythema immediately surrounding it. No evidence of extensive cellulitis. No warmth.     Impression & Recommendations:  Problem # 1:  ULCER, LEG (ICD-707.10) Assessment: New There is a small ulcer on the left medial malleolus. It appears to have some mild infection which warrants some antibiotics. Because she is on coumadin, I discussed the best options with Dr. Alexandria Lodge. I will place her on Doxycycline. I encouraged her to see a physician in [place] on Friday of this week (no later than next Monday) for INR check. She then informed me that she will not be leaving for several weeks (about 5). For this reason, I told her to come back to our clinic for an INR check given the addition of an antibiotic.  Problem # 2:  HYPERTENSION (ICD-401.9) Assessment: Comment Only Elevated today. I discussed the impact of her otc sudafed on her BP. She needs to be on an agent. I will check a CMET today to see how her creatinine and potassium are. She will return in 2 wks. She was very hesitant about stopping her sudafed. I told her to wean it over time. I also wrote for fluticasone nasal spray 50 micrograms 2 sprays per nostril daily. Hopefully, getting of the sudafed will have a positive impact on her Blood pressure.   BP today: 154/81 Prior BP: 146/85 (01/16/2007)  Orders: T-Comprehensive Metabolic Panel ([id number])   Problem # 3:  HYPERLIPIDEMIA (ICD-272.4) Assessment: Comment Only We have no labs in EMR. She is not fasting today. We can check when she returns for her f/u.  Orders: T-Comprehensive Metabolic Panel ([id number])   Problem # 4:  Preventive Health Care (ICD-V70.0) Assessment: Comment Only I discussed the importance of health maintenance. She says that she has had a m-gram, but we also need to follow her HTN, HL, colon cancer screening, etc. She understands this. She will have Dr. Lelon Perla send their  records to [place] as well.   Problem # 5:  SINUSITIS, CHRONIC (ICD-473.9) Assessment: Comment Only I encouraged her to cut down and eventually stop the sudafed. Nasal steroids are a good choice for her. I will write for Flonase.  Her updated medication list for this problem includes:    Sudafed Pe Maximum Strength 10 Mg Tabs (Phenylephrine hcl) .Marland Kitchen... 2 tabs qid as needed for sinus  symptoms.    Doxycycline Hyclate 100 Mg Tabs (Doxycycline hyclate) .Marland Kitchen... Take 1 tablet by mouth two times a day for 10 days    Flonase 50 Mcg/act Susp (Fluticasone propionate) .Marland Kitchen... 2 sprays per nostril once daily   Complete Medication List: 1)  Actonel 35 Mg Tabs (Risedronate sodium) .... Take 1 tablet by mouth once a day 2)  Detrol La 4 Mg Cp24 (Tolterodine tartrate) .... Take 1 tablet by mouth once a day 3)  Fexofenadine Hcl 180 Mg Tabs (Fexofenadine hcl) .... Take 1 tablet by mouth once a day 4)  Caltrate 600+d Tabs (Calcium carbonate-vitamin d tabs) .... Take 1 tablet by mouth two times a day 5)  Centrum Tabs (Multiple vitamins-minerals) .... Take 1 tablet by mouth once a day 6)  Vitamin C Tabs (Ascorbic acid tabs) .... Take 1 tablet by mouth once a day 7)  Famotidine 10 Mg Tabs (Famotidine) .... Take 1 tablet by mouth once a day 8)  Warfarin Sodium Tabs (Warfarin sodium tabs) .... Tablet strength: 5mg  take as directed 9)  Joint Support Formula Caps (Glucos-chondroit-collag-hyal) .... 2 caps two times a day 10)  Aleve 220 Mg Tabs (Naproxen sodium) .Marland Kitchen.. 1-2 tabs as needed 11)  Sudafed Pe Maximum Strength 10 Mg Tabs (Phenylephrine hcl) .... 2 tabs qid as needed for sinus symptoms. 12)  Doxycycline Hyclate 100 Mg Tabs (Doxycycline hyclate) .... Take 1 tablet by mouth two times a day for 10 days 13)  Flonase 50 Mcg/act Susp (Fluticasone propionate) .... 2 sprays per nostril once daily   Patient Instructions: 1)  Please check your INR at a health facility in [place] on Friday this week (No later than Monday).  2)   Please keep your appointment with Dr. Harvie Junior.  3)  Please have Dr. Lelon Perla send records to our clinic.     Prescriptions: FLONASE 50 MCG/ACT  SUSP (FLUTICASONE PROPIONATE) 2 sprays per nostril once daily  #1 mo supply x 3   Entered and Authorized by:   Eliseo Gum MD   Signed by:   Eliseo Gum MD on 02/12/2008   Method used:   Print then Give to Patient   RxID:   [id number] DOXYCYCLINE HYCLATE 100 MG  TABS (DOXYCYCLINE HYCLATE) Take 1 tablet by mouth two times a day for 10 days  #20 x 0   Entered and Authorized by:   Eliseo Gum MD   Signed by:   Eliseo Gum MD on 02/12/2008   Method used:   Print then Give to Patient   RxID:   [id number]  ]  Appended Document: foot wound/gg    Clinical Lists Changes  Observations: Added new observation of PAST MED HX: Cervical cancer, hx of, Dr Lelon Perla Pulmonary embolism, hx of Bilateral DVT- Mar 25 2006 Heterozygous for factor V Leiden mutation Chronic sinusitis Osteoporosis Hypertension Stress incontinence (02/12/2008 17:14) Added new observation of TUBAL LIG HX: yes (02/12/2008 17:14) Added new observation of PAST SURG HX: Cleft lip repair  [facility], [place], [place] at 6 wks of age) -  1942, @ age 29, repeat Tubal ligation-1976 Adenocarcinoma of Endocervix  -  Radium implants= Dec 1983, Dr. Derrell Lolling and Dr. Addison Bailey) -  Radiation Tx x 15 at [facility] 1984 -  Removal of Cervix and repair of vagina- 2006 Arthroscopy of Left knee for torn cartilage- 1986 Hammertoe repair (3 toes each foot)- 1990-91   (02/12/2008 17:14)         Past Medical History:    Cervical  cancer, hx of, Dr Lelon Perla    Pulmonary embolism, hx of    Bilateral DVT- Mar 25 2006    Heterozygous for factor V Leiden mutation    Chronic sinusitis    Osteoporosis    Hypertension    Stress incontinence  Past Surgical History:    Cleft lip repair     -  [facility], [place], [place] at 6 wks of age)    -   1942, @ age 57, repeat    Tubal ligation-1976    Adenocarcinoma of Endocervix     -  Radium implants= Dec 1983, Dr. Derrell Lolling and Dr. Addison Bailey)    -  Radiation Tx x 15 at [facility] 1984    -  Removal of Cervix and repair of vagina- 2006    Arthroscopy of Left knee for torn cartilage- 1986    Hammertoe repair (3 toes each foot)- 1990-91

## 2010-12-08 NOTE — Miscellaneous (Signed)
  Clinical Lists Changes  Observations: Added new observation of MAMMO DUE: 08/2008 (04/09/2008 15:05) Added new observation of HDLNXTDUE: 03/04/2013 (04/09/2008 15:05) Added new observation of LDLNXTDUE: 03/04/2013 (04/09/2008 15:05) Added new observation of HEMOCULTDUE: 03/21/2009 (04/09/2008 15:05) Added new observation of CREATNXTDUE: 03/04/2009 (04/09/2008 15:05) Added new observation of POTASSIUMDUE: 03/04/2009 (04/09/2008 15:05) Added new observation of MAMMRECACT: Screening mammogram in 1 year.    (08/15/2007 15:10) Added new observation of MAMMOGRAM: No specific mammographic evidence of malignancy.  Assessment: BIRADS 1.  (08/15/2007 15:10) Added new observation of BONE DENSITY:  Lumbar Spine:  T Score > -1.0 Spine.  Hip Total: T Score > -1.0 Hip (Femoral neck -1.3). Left forearm -2.6.  Note: Spine improved => -3.1 (2001) to -2.1 (2006).  (07/29/2005 15:18)          Mammogram  Procedure date:  08/15/2007  Findings:      No specific mammographic evidence of malignancy.  Assessment: BIRADS 1.   Comments:      Screening mammogram in 1 year.     Bone Density  Procedure date:  07/29/2005  Findings:       Lumbar Spine:  T Score > -1.0 Spine.  Hip Total: T Score > -1.0 Hip (Femoral neck -1.3). Left forearm -2.6.  Note: Spine improved => -3.1 (2001) to -2.1 (2006).   Comments:      Assessment:  Osteopenia.     Procedures Next Due Date:    Mammogram: 08/2008

## 2010-12-08 NOTE — Progress Notes (Signed)
Summary: B/P readings  Phone Note Outgoing Call   Call placed by: Katina Dung, RN, BSN,  October 13, 2010 3:33 PM Call placed to: Patient Summary of Call: B/P readings  Follow-up for Phone Call        recent B/P readings 10/08/10 151/79 67 10/09/10 129/74  75 10/10/10  123/69  64 10/11/10  118/66  72 10/12/10  139/80  68 pt states about the time(about a week ago)she changed from HCTZ to chlorthalidone  she noticed her eyes  were very dry and she sometimes has to squint hard to see things--pt asking if this could be related to chlorthalidone Medicare will not pay for a routine eye exam until 2012--I will forward  to Dr Shirlee Latch for reivew        Appended Document: B/P readings look good  Appended Document: B/P readings Try stopping chlorthalidone to see if it gets better.  Would have her use amlodipine 5 mg daily instead.   Appended Document: B/P readings discussed with pt by telephone--pt currently taking Amlodipine 10mg  daily--reviewed with Dr Darvin Neighbours recommended pt hold  Chlorthalidone for now to see if symptoms improve, monitor symptoms and B/P --pt verbalized understanding   Clinical Lists Changes  Medications: Removed medication of CHLORTHALIDONE 25 MG TABS (CHLORTHALIDONE) one daily

## 2010-12-08 NOTE — Assessment & Plan Note (Signed)
Summary: COU/VS  Anticoagulant Therapy Managed by: [name]. [name]  PharmD CACP [facility] Attending: Margarito Liner MD Indication 1: Deep vein thrombus Indication 2: Aftercare long term use Anticoagulants V58.61, V58.83 Start date: 03/25/2006 Duration: Indefinite  Patient Assessment Reviewed by: [name] PharmD  February 19, 2008 Medication review: verified warfarin dosage & schedule,verified previous prescription medications, verified doses & any changes, verified new medications, reviewed OTC medications, reviewed OTC health products-vitamins supplements etc Complications: none Dietary changes: none   Health status changes: none   Lifestyle changes: none   Recent/future hospitalizations: none   Recent/future procedures: none   Recent/future dental: none Patient Assessment Part 2:  Have you MISSED ANY DOSES or CHANGED TABLETS?  No missed Warfarin doses or changed tablets.  Have you had any BRUISING or BLEEDING ( nose or gum bleeds,blood in urine or stool)?  No reported bruising or bleeding in nose, gums, urine, stool.  Have you STARTED or STOPPED any MEDICATIONS, including OTC meds,herbals or supplements?  No other medications or herbal supplements were started or stopped.  Have you CHANGED your DIET, especially green vegetables,or ALCOHOL intake?  No changes in diet or alcohol intake.  Have you had any ILLNESSES or HOSPITALIZATIONS?  No reported illnesses or hospitalizations  Have you had any signs of CLOTTING?(chest discomfort,dizziness,shortness of breath,arms tingling,slurred speech,swelling or redness in leg)    No chest discomfort, dizziness, shortness of breath, tingling in arm, slurred speech, swelling, or redness in leg.     Treatment  Target INR: 2.0-3.0 INR: 3.1  Date: 02/19/2008 Regimen In:  35.0mg /week INR reflects regimen in: 3.1  New  Tablet strength: : 5mg  Regimen Out:     Sunday: 1 Tablet     Monday: 1 Tablet     Tuesday: 1 Tablet     Wednesday: 1/2  Tablet     Thursday: 1 Tablet      Friday: 1 Tablet     Saturday: 1 Tablet Total Weekly: 32.5mg /week mg  Next INR Due: 03/18/2008 Adjusted by: [name]. [name] PharmD CACP   Return to anticoagulation clinic:  03/18/2008 Time of next visit: 1000

## 2010-12-08 NOTE — Assessment & Plan Note (Signed)
Summary: EST-ROUTINE CHECKUP/CH   Vital Signs:  Patient Profile:   75 Years Old Female Height:     65 inches (165.10 cm) Weight:      207.1 pounds (94.14 kg) BMI:     34.59 Temp:     97.5 degrees F (36.39 degrees C) oral Pulse rate:   80 / minute BP sitting:   160 / 80  (right arm)  Pt. in pain?   yes    Location:   ulcer    Intensity:   5    Type:       aching  Vitals Entered By: Filomena Jungling NT II (November 19, 2008 8:46 AM)              Is Patient Diabetic? No Nutritional Status BMI of 25 - 29 = overweight  Have you ever been in a relationship where you felt threatened, hurt or afraid?No   Does patient need assistance? Functional Status Self care Ambulation Normal     Chief Complaint:  CHECK-UP.  History of Present Illness: Judith [name] is a 75 yo woman who is in today for follow up of her multiple medical problems.  1. Factor V Leiden with hx of PE/DVT, on Coumadin - Followed by Dr Alexandria Lodge.  2. Elevated BP - Has been trying to diet, exercise. Had refused tx at last visit.  3. L. LE ulcer - Ulcer itself has healed but area still red and scaling. Sometimes "cracks" and oozes a small amt of clear fluid/blood but no pus.   Has tried abx ointments and Eucerin cream. Using alcohol gel on area. Wearing compression hose on both legs.  4. Cervical CA 1983 / recurrence 2006 - Just saw Dr Elana Alm on 11/12/2008. No problems by pt report.  5. Hyperlipidemia - Again, pt denied tx at last visit. Wanted to work on diet/exercise.  6. Osteoporosis - Taking bisphosphonate as directed.  7. Urinary incontinence - Improved with Detrol.    Prior Medications Reviewed Using: Patient Recall  Prior Medication List:  ACTONEL 35 MG TABS (RISEDRONATE SODIUM) Take 1 tablet by mouth once a day DETROL LA 4 MG CP24 (TOLTERODINE TARTRATE) Take 1 tablet by mouth once a day FEXOFENADINE HCL 180 MG TABS (FEXOFENADINE HCL) Take 1 tablet by mouth once a day CALTRATE 600+D  TABS (CALCIUM CARBONATE-VITAMIN D  TABS) Take 1 tablet by mouth two times a day CENTRUM  TABS (MULTIPLE VITAMINS-MINERALS) Take 1 tablet by mouth once a day VITAMIN C  TABS (ASCORBIC ACID TABS) Take 1 tablet by mouth once a day FAMOTIDINE 10 MG TABS (FAMOTIDINE) Take 1 tablet by mouth once a day JOINT SUPPORT FORMULA   CAPS (GLUCOS-CHONDROIT-COLLAG-HYAL) 2 caps two times a day SUDAFED PE MAXIMUM STRENGTH 10 MG  TABS (PHENYLEPHRINE HCL) 2 tabs qid as needed for sinus symptoms. * MEDIUM COMPRESSION HOSE   MISC (ELASTIC BANDAGES & SUPPORTS) Apply daily in am as directed. WARFARIN SODIUM 5 MG TABS (WARFARIN SODIUM) Tablet Strength: 5mg  Take as directed   Current Allergies: ! SULFA PCN    Risk Factors: Tobacco use:  never Alcohol use:  no Exercise:  no Seatbelt use:  100 %  Colonoscopy History:    Date of Last Colonoscopy:  10/11/2000  Mammogram History:    Date of Last Mammogram:  08/15/2008   Review of Systems  General      Denies chills and fever.  CV      Denies chest pain or discomfort and palpitations.  Resp  Denies cough, shortness of breath, and sputum productive.  GI      Denies abdominal pain and change in bowel habits.  GU      Denies dysuria.  Judith      Denies joint pain.  Derm      Complains of poor wound healing.      see HPI  Neuro      Denies numbness and weakness.  Psych      Denies depression.   Physical Exam  General:     alert, cooperative to examination, and overweight-appearing.   Lungs:     normal respiratory effort and normal breath sounds.   Heart:     normal rate and regular rhythm.   Abdomen:     soft, non-tender, and normal bowel sounds.   Pulses:     pedal pulses palpable Neurologic:     alert & oriented X3 and gait normal.   Skin:     L. medial malleolus with erythematous, scaling lesion  ~7cm in greatest dimension; no drainage, no ulceration Psych:     Oriented X3, normally interactive, not anxious appearing, and not depressed appearing.       Impression & Recommendations:  Problem # 1:  ULCER, LEG (ICD-707.10) Ulcer has healed. Gave instructions on wound care including no alcohol, no other OTC tx's, no compression stocking on that leg. Gave dimethacone cream to use for itching. Pt to call if lesion worsens.   Problem # 2:  HYPERTENSION (ICD-401.9) Will start pt on HCTZ today. RTC 1 mo for BMET and BP check. Check BMET today as well. Pt in agreement.  Her updated medication list for this problem includes:    Hydrochlorothiazide 25 Mg Tabs (Hydrochlorothiazide) .Marland Kitchen... Take 1 tablet by mouth once a day  Orders: T-Basic Metabolic Panel [id number])  BP today: 160/80 Prior BP: 149/79 (07/02/2008)  Labs Reviewed: Creat: 0.84 (03/04/2008) Chol: 223 (03/04/2008)   HDL: 58 (03/04/2008)   LDL: 126 (03/04/2008)   TG: 195 (03/04/2008)   Problem # 3:  HYPERLIPIDEMIA (ICD-272.4) Pt agrees to start statin. RTC in 1 mo for liver tests and lipid panel.  Her updated medication list for this problem includes:    Pravastatin Sodium 20 Mg Tabs (Pravastatin sodium) .Marland Kitchen... Take 1 tablet by mouth once a day  Labs Reviewed: Chol: 223 (03/04/2008)   HDL: 58 (03/04/2008)   LDL: 126 (03/04/2008)   TG: 195 (03/04/2008) SGOT: 21 (03/04/2008)   SGPT: 16 (03/04/2008)   Problem # 4:  PULMONARY EMBOLISM, HX OF (ICD-V12.51) Factor V Leiden with hx of PE/DVT. On Coumadin. Followed by Dr Alexandria Lodge. Her updated medication list for this problem includes:    Warfarin Sodium 5 Mg Tabs (Warfarin sodium) .Marland Kitchen... Tablet strength: 5mg  take as directed   Problem # 5:  MACROCYTIC ANEMIA (ICD-281.9) Macrocytosis (MCV 100.2) but normal Hgb with last labs (4/09). Check CBC today.  Her updated medication list for this problem includes:    D 1000 Plus Tabs (Fa-cyanocobalamin-b6-d-ca) .Marland Kitchen... Take 1 tablet by mouth once a day  Orders: T-CBC No Diff ([id number]) Hgb: 13.9 (03/04/2008)   Hct: 44.5 (03/04/2008)   RDW: 12.9 (03/04/2008)   MCV: 100.2 (03/04/2008)        Problem # 6:  OSTEOPOROSIS (ICD-733.00) Actonel just changed to Fosamax by Dr Lelon Perla due to cost. Bone density improved on last DXA.  The following medications were removed from the medication list:    Actonel 35 Mg Tabs (Risedronate sodium) .Marland Kitchen... Take 1 tablet by mouth once a  day  Her updated medication list for this problem includes:    Caltrate 600+d Tabs (Calcium carbonate-vitamin d tabs) .Marland Kitchen... Take 1 tablet by mouth two times a day    Fosamax 70 Mg Tabs (Alendronate sodium) .Marland Kitchen... Take 1 tablet by mouth once a week   Problem # 7:  STRESS INCONTINENCE (ICD-788.39) Symptoms improved on Detrol.   Problem # 8:  Preventive Health Care (ICD-V70.0) Mammo neg 10/09 Colonoscopy neg 12/01 Stool guaics neg x 3 5/09 DXA 6/9 Zostavax given  Complete Medication List: 1)  Caltrate 600+d Tabs (Calcium carbonate-vitamin d tabs) .... Take 1 tablet by mouth two times a day 2)  Centrum Tabs (Multiple vitamins-minerals) .... Take 1 tablet by mouth once a day 3)  Vitamin C Tabs (Ascorbic acid tabs) .... Take 1 tablet by mouth once a day 4)  Famotidine 10 Mg Tabs (Famotidine) .... Take 1 tablet by mouth once a day 5)  Joint Support Formula Caps (Glucos-chondroit-collag-hyal) .... 2 caps two times a day 6)  Sudafed Pe Maximum Strength 10 Mg Tabs (Phenylephrine hcl) .... 2 tabs qid as needed for sinus symptoms. 7)  Medium Compression Hose Misc (elastic Bandages & Supports)  .... Apply daily in am as directed. 8)  Warfarin Sodium 5 Mg Tabs (Warfarin sodium) .... Tablet strength: 5mg  take as directed 9)  Ditropan Xl 5 Mg Xr24h-tab (Oxybutynin chloride) .... Take 1 tablet by mouth two times a day 10)  Fosamax 70 Mg Tabs (Alendronate sodium) .... Take 1 tablet by mouth once a week 11)  D 1000 Plus Tabs (Fa-cyanocobalamin-b6-d-ca) .... Take 1 tablet by mouth once a day 12)  Hydrochlorothiazide 25 Mg Tabs (Hydrochlorothiazide) .... Take 1 tablet by mouth once a day 13)  Pravastatin Sodium 20 Mg Tabs  (Pravastatin sodium) .... Take 1 tablet by mouth once a day   Patient Instructions: 1)  Please schedule a follow-up appointment in 1 month for blood pressure check and lab work. 2)  Do not put any alcohol on your leg wound. Do not use abrasive soaps and do not scrub it with a wash cloth. Do not wear your compression hose on that leg until the wound heals. Try using the dimethicone cream 2% that we gave you. You can use this 2 times a day.  3)  Get the prescriptions filled for your blood pressure (HCTZ) and your cholesterol (Pravastatin). You can get both of these for $4.00 at [facility] or [facility].   Prescriptions: PRAVASTATIN SODIUM 20 MG TABS (PRAVASTATIN SODIUM) Take 1 tablet by mouth once a day  #30 x 5   Entered and Authorized by:   Ned Grace MD   Signed by:   Ned Grace MD on 11/19/2008   Method used:   Print then Give to Patient   RxID:   [id number] HYDROCHLOROTHIAZIDE 25 MG TABS (HYDROCHLOROTHIAZIDE) Take 1 tablet by mouth once a day  #30 x 5   Entered and Authorized by:   Ned Grace MD   Signed by:   Ned Grace MD on 11/19/2008   Method used:   Print then Give to Patient   RxID:   [id number]

## 2010-12-08 NOTE — Assessment & Plan Note (Signed)
Summary: COU/VS  Anticoagulant Therapy Managed by: [name]. [name]  PharmD CACP OPC Attending: Manning Charity MD Indication 1: Deep vein thrombus Indication 2: Aftercare long term use Anticoagulants V58.61, V58.83 Start date: 03/25/2006 Duration: Indefinite  Patient Assessment Reviewed by: [name] PharmD  June 19, 2007 Medication review: verified warfarin dosage & schedule,verified previous prescription medications, verified doses & any changes, verified new medications, reviewed OTC medications, reviewed OTC health products-vitamins supplements etc Complications: none Dietary changes: none   Health status changes: none   Lifestyle changes: none   Recent/future hospitalizations: none   Recent/future procedures: none   Recent/future dental: none Patient Assessment Part 2:  Have you MISSED ANY DOSES or CHANGED TABLETS?  No missed Warfarin doses or changed tablets.  Have you had any BRUISING or BLEEDING ( nose or gum bleeds,blood in urine or stool)?  No reported bruising or bleeding in nose, gums, urine, stool.  Have you STARTED or STOPPED any MEDICATIONS, including OTC meds,herbals or supplements?  No other medications or herbal supplements were started or stopped.  Have you CHANGED your DIET, especially green vegetables,or ALCOHOL intake?  No changes in diet or alcohol intake.  Have you had any ILLNESSES or HOSPITALIZATIONS?  No reported illnesses or hospitalizations  Have you had any signs of CLOTTING?(chest discomfort,dizziness,shortness of breath,arms tingling,slurred speech,swelling or redness in leg)    No chest discomfort, dizziness, shortness of breath, tingling in arm, slurred speech, swelling, or redness in leg.     Treatment  Target INR: 2.0-3.0 INR: 2.6  Date: 06/19/2007 Regimen In:  30.0mg /week INR reflects regimen in: 2.6  New  Tablet strength: : 5mg  Regimen Out:     Sunday: 1 Tablet     Monday: 1/2 Tablet     Tuesday: 1 Tablet     Wednesday:  1 Tablet     Thursday: 1/2 Tablet      Friday: 1 Tablet     Saturday: 1 Tablet Total Weekly: 30.0mg /week mg  Next INR Due: 07/17/2007 Adjusted by: [name]. [name] PharmD CACP   Return to anticoagulation clinic:  07/17/2007 Time of next visit: 0930

## 2010-12-08 NOTE — Assessment & Plan Note (Signed)
Summary: COU/VS  Anticoagulant Therapy Managed by: [name]. [name]  PharmD CACP OPC Attending: Margarito Liner MD Indication 1: Deep vein thrombus Indication 2: Aftercare long term use Anticoagulants V58.61, V58.83 Start date: 03/25/2006 Duration: Indefinite  Patient Assessment Reviewed by: [name] PharmD  July 29, 2008 Medication review: verified warfarin dosage & schedule,verified previous prescription medications, verified doses & any changes, verified new medications, reviewed OTC medications, reviewed OTC health products-vitamins supplements etc Complications: none Dietary changes: none   Health status changes: none   Lifestyle changes: none   Recent/future hospitalizations: none   Recent/future procedures: none   Recent/future dental: none Patient Assessment Part 2:  Have you MISSED ANY DOSES or CHANGED TABLETS?  No missed Warfarin doses or changed tablets.  Have you had any BRUISING or BLEEDING ( nose or gum bleeds,blood in urine or stool)?  No reported bruising or bleeding in nose, gums, urine, stool.  Have you STARTED or STOPPED any MEDICATIONS, including OTC meds,herbals or supplements?  No other medications or herbal supplements were started or stopped.  Have you CHANGED your DIET, especially green vegetables,or ALCOHOL intake?  No changes in diet or alcohol intake.  Have you had any ILLNESSES or HOSPITALIZATIONS?  No reported illnesses or hospitalizations  Have you had any signs of CLOTTING?(chest discomfort,dizziness,shortness of breath,arms tingling,slurred speech,swelling or redness in leg)    No chest discomfort, dizziness, shortness of breath, tingling in arm, slurred speech, swelling, or redness in leg.     Treatment  Target INR: 2.0-3.0 INR: 2.9  Date: 07/29/2008 Regimen In:  30.0mg /week INR reflects regimen in: 2.9  New  Tablet strength: : 5mg  Regimen Out:     Sunday: 0 Tablet     Monday: 1 Tablet     Tuesday: 1 Tablet     Wednesday:  1/2 Tablet     Thursday: 1 Tablet      Friday: 1 Tablet     Saturday: 1 Tablet Total Weekly: 27.5mg /week mg  Next INR Due: 08/26/2008 Adjusted by: [name]. [name] PharmD CACP   Return to anticoagulation clinic:  08/26/2008 Time of next visit: 1000

## 2010-12-08 NOTE — Assessment & Plan Note (Signed)
Summary: COU/CH  Anticoagulant Therapy Managed by: [name]. Judith [name]  PharmD CACP PCP: Zoila Shutter MD [facility] AttendingRogelia Boga MD, Lanora Manis Indication 1: Deep vein thrombus Indication 2: Aftercare long term use Anticoagulants V58.61, V58.83 Start date: 03/25/2006 Duration: Indefinite  Patient Assessment Reviewed by: [name] PharmD  November 10, 2009 Medication review: verified warfarin dosage & schedule,verified previous prescription medications, verified doses & any changes, verified new medications, reviewed OTC medications, reviewed OTC health products-vitamins supplements etc Complications: none Dietary changes: none   Health status changes: none   Lifestyle changes: none   Recent/future hospitalizations: none   Recent/future procedures: none   Recent/future dental: none Patient Assessment Part 2:  Have you MISSED ANY DOSES or CHANGED TABLETS?  No missed Warfarin doses or changed tablets.  Have you had any BRUISING or BLEEDING ( nose or gum bleeds,blood in urine or stool)?  No reported bruising or bleeding in nose, gums, urine, stool.  Have you STARTED or STOPPED any MEDICATIONS, including OTC meds,herbals or supplements?  No other medications or herbal supplements were started or stopped.  Have you CHANGED your DIET, especially green vegetables,or ALCOHOL intake?  No changes in diet or alcohol intake.  Have you had any ILLNESSES or HOSPITALIZATIONS?  No reported illnesses or hospitalizations  Have you had any signs of CLOTTING?(chest discomfort,dizziness,shortness of breath,arms tingling,slurred speech,swelling or redness in leg)    No chest discomfort, dizziness, shortness of breath, tingling in arm, slurred speech, swelling, or redness in leg.     Treatment  Target INR: 2.0-3.0 INR: 3.9  Date: 11/10/2009 Regimen In:  32.5mg /week INR reflects regimen in: 3.9  New  Tablet strength: : 5mg  Regimen Out:     Sunday: 1 Tablet     Monday: 1 Tablet  Tuesday: 1 Tablet     Wednesday: 0 Tablet     Thursday: 1 Tablet      Friday: 1 Tablet     Saturday: 1 Tablet Total Weekly: 30.0mg /week mg  Next INR Due: 12/01/2009 Adjusted by: [name]. [name] PharmD CACP   Return to anticoagulation clinic:  12/01/2009 Time of next visit: 0915  Hold:  1 Days     Allergies: 1)  ! Sulfa 2)  Pcn

## 2010-12-08 NOTE — Assessment & Plan Note (Signed)
Summary: 261/DS  Anticoagulant Therapy Managed by: [name]. Janie Morning  PharmD CACP OPC Attending: Sampson Goon MD, Onalee Hua Indication 1: Deep vein thrombus Indication 2: Aftercare long term use Anticoagulants V58.61, V58.83 Start date: 03/25/2006 Duration: Indefinite  Patient Assessment Reviewed by: [name] PharmD  July 21, 2009 Medication review: verified warfarin dosage & schedule,verified previous prescription medications, verified doses & any changes, verified new medications, reviewed OTC medications, reviewed OTC health products-vitamins supplements etc Complications: none Dietary changes: none   Health status changes: none   Lifestyle changes: none   Recent/future hospitalizations: none   Recent/future procedures: none   Recent/future dental: none Patient Assessment Part 2:  Have you MISSED ANY DOSES or CHANGED TABLETS?  No missed Warfarin doses or changed tablets.  Have you had any BRUISING or BLEEDING ( nose or gum bleeds,blood in urine or stool)?  No reported bruising or bleeding in nose, gums, urine, stool.  Have you STARTED or STOPPED any MEDICATIONS, including OTC meds,herbals or supplements?  No other medications or herbal supplements were started or stopped.  Have you CHANGED your DIET, especially green vegetables,or ALCOHOL intake?  No changes in diet or alcohol intake.  Have you had any ILLNESSES or HOSPITALIZATIONS?  No reported illnesses or hospitalizations  Have you had any signs of CLOTTING?(chest discomfort,dizziness,shortness of breath,arms tingling,slurred speech,swelling or redness in leg)    No chest discomfort, dizziness, shortness of breath, tingling in arm, slurred speech, swelling, or redness in leg.     Treatment  Target INR: 2.0-3.0 INR: 3.1  Date: 07/21/2009 Regimen In:  32.5mg /week INR reflects regimen in: 3.1  New  Tablet strength: : 5mg  Regimen Out:     Sunday: 1 Tablet     Monday: 1 Tablet     Tuesday: 1 Tablet  Wednesday: 0 Tablet     Thursday: 1 Tablet      Friday: 1 Tablet     Saturday: 1 Tablet Total Weekly: 30.0mg /week mg  Next INR Due: 08/18/2009 Adjusted by: [name]. [name] PharmD CACP   Return to anticoagulation clinic:  08/18/2009 Time of next visit: 0900    Allergies: 1)  ! Sulfa 2)  Pcn

## 2010-12-08 NOTE — Assessment & Plan Note (Signed)
Summary: COU/CH  Anticoagulant Therapy Managed by: [name]. [name]  PharmD CACP OPC Attending: Manning Charity MD Indication 1: Deep vein thrombus Indication 2: Aftercare long term use Anticoagulants V58.61, V58.83 Start date: 03/25/2006 Duration: Indefinite  Patient Assessment Reviewed by: [name] PharmD  April 22, 2008 Medication review: verified warfarin dosage & schedule,verified previous prescription medications, verified doses & any changes, verified new medications, reviewed OTC medications, reviewed OTC health products-vitamins supplements etc Complications: none Dietary changes: none   Health status changes: none   Lifestyle changes: none   Recent/future hospitalizations: none   Recent/future procedures: none   Recent/future dental: none Patient Assessment Part 2:  Have you MISSED ANY DOSES or CHANGED TABLETS?  No missed Warfarin doses or changed tablets.  Have you had any BRUISING or BLEEDING ( nose or gum bleeds,blood in urine or stool)?  No reported bruising or bleeding in nose, gums, urine, stool.  Have you STARTED or STOPPED any MEDICATIONS, including OTC meds,herbals or supplements?  No other medications or herbal supplements were started or stopped.  Have you CHANGED your DIET, especially green vegetables,or ALCOHOL intake?  No changes in diet or alcohol intake.  Have you had any ILLNESSES or HOSPITALIZATIONS?  No reported illnesses or hospitalizations  Have you had any signs of CLOTTING?(chest discomfort,dizziness,shortness of breath,arms tingling,slurred speech,swelling or redness in leg)    No chest discomfort, dizziness, shortness of breath, tingling in arm, slurred speech, swelling, or redness in leg.     Treatment  Target INR: 2.0-3.0 INR: 5.9  Date: 04/22/2008 Regimen In:  32.5mg /week INR reflects regimen in: 5.9  New  Tablet strength: : 5mg  Regimen Out:     Sunday: 1 Tablet     Monday: 1 Tablet     Tuesday: 1 Tablet     Wednesday: 0  Tablet     Thursday: 1 Tablet      Friday: 1 Tablet     Saturday: 1 Tablet Total Weekly: 30.0mg /week mg  Next INR Due: 04/29/2008 Adjusted by: [name]. [name] PharmD CACP   Return to anticoagulation clinic:  04/29/2008 Time of next visit: 0900  Hold:  1 Days    Comments: Will HOLD/OMIT x 1 dose, reduce weekly dose and RTC next Monday.

## 2010-12-08 NOTE — Assessment & Plan Note (Signed)
Summary: COU/VS  Anticoagulant Therapy Managed by: [name]. [name]  PharmD CACP OPC Attending: Eliseo Gum MD Indication 1: Deep vein thrombus Indication 2: Aftercare long term use Anticoagulants V58.61, V58.83 Start date: 03/25/2006 Duration: Indefinite  Patient Assessment Reviewed by: [name] PharmD  January 22, 2008 Medication review: verified warfarin dosage & schedule,verified previous prescription medications, verified doses & any changes, verified new medications, reviewed OTC medications, reviewed OTC health products-vitamins supplements etc Complications: none Dietary changes: none   Health status changes: none   Lifestyle changes: none   Recent/future hospitalizations: none   Recent/future procedures: none   Recent/future dental: none Patient Assessment Part 2:  Have you MISSED ANY DOSES or CHANGED TABLETS?  No missed Warfarin doses or changed tablets.  Have you had any BRUISING or BLEEDING ( nose or gum bleeds,blood in urine or stool)?  No reported bruising or bleeding in nose, gums, urine, stool.  Have you STARTED or STOPPED any MEDICATIONS, including OTC meds,herbals or supplements?  No other medications or herbal supplements were started or stopped.  Have you CHANGED your DIET, especially green vegetables,or ALCOHOL intake?  No changes in diet or alcohol intake.  Have you had any ILLNESSES or HOSPITALIZATIONS?  No reported illnesses or hospitalizations  Have you had any signs of CLOTTING?(chest discomfort,dizziness,shortness of breath,arms tingling,slurred speech,swelling or redness in leg)    No chest discomfort, dizziness, shortness of breath, tingling in arm, slurred speech, swelling, or redness in leg.     Treatment  Target INR: 2.0-3.0 INR: 1.7  Date: 01/22/2008 Regimen In:  30.0mg /week INR reflects regimen in: 1.7  New  Tablet strength: : 5mg  Regimen Out:     Sunday: 1 Tablet     Monday: 1 Tablet     Tuesday: 1 Tablet     Wednesday: 1  Tablet     Thursday: 1 Tablet      Friday: 1 Tablet     Saturday: 1 Tablet Total Weekly: 35.0mg/week mg  Next INR Due: 02/12/2008 Adjusted by: James B. Groce III PharmD CACP   Return to anticoagulation clinic:  02/12/2008 Time of next visit: 0915     Prescriptions: WARFARIN SODIUM  TABS (WARFARIN SODIUM TABS) Tablet Strength: 5mg Take as directed  #30 x 2   Entered by:   Jay Groce PharmD   Authorized by:   Kamau Crawford MD   Signed by:   Jay Groce PharmD on 01/22/2008   Method used:   Electronically sent to ...       [facility]  [address] [id number]       [address]       [place], [place]  [address]       Ph: [phone] or [phone]       Fax: [phone]   RxID:   [id number]

## 2010-12-08 NOTE — Assessment & Plan Note (Signed)
Summary: COU/VS  Anticoagulant Therapy Managed by: [name]. Judith [name]  PharmD CACP PCP: Zoila Shutter MD [facility] AttendingRogelia Boga MD, Lanora Manis Indication 1: Deep vein thrombus Indication 2: Aftercare long term use Anticoagulants V58.61, V58.83 Start date: 03/25/2006 Duration: Indefinite  Patient Assessment Reviewed by: [name] PharmD  December 01, 2009 Medication review: verified warfarin dosage & schedule,verified previous prescription medications, verified doses & any changes, verified new medications, reviewed OTC medications, reviewed OTC health products-vitamins supplements etc Complications: none Dietary changes: none   Health status changes: none   Lifestyle changes: none   Recent/future hospitalizations: none   Recent/future procedures: none   Recent/future dental: none Patient Assessment Part 2:  Have you MISSED ANY DOSES or CHANGED TABLETS?  No missed Warfarin doses or changed tablets.  Have you had any BRUISING or BLEEDING ( nose or gum bleeds,blood in urine or stool)?  No reported bruising or bleeding in nose, gums, urine, stool.  Have you STARTED or STOPPED any MEDICATIONS, including OTC meds,herbals or supplements?  No other medications or herbal supplements were started or stopped.  Have you CHANGED your DIET, especially green vegetables,or ALCOHOL intake?  No changes in diet or alcohol intake.  Have you had any ILLNESSES or HOSPITALIZATIONS?  No reported illnesses or hospitalizations  Have you had any signs of CLOTTING?(chest discomfort,dizziness,shortness of breath,arms tingling,slurred speech,swelling or redness in leg)    No chest discomfort, dizziness, shortness of breath, tingling in arm, slurred speech, swelling, or redness in leg.     Treatment  Target INR: 2.0-3.0 INR: 2.1  Date: 12/01/2009 Regimen In:  30.0mg /week INR reflects regimen in: 2.1  New  Tablet strength: : 5mg  Regimen Out:     Sunday: 1 Tablet     Monday: 1 Tablet  Tuesday: 1 Tablet     Wednesday: 1 Tablet     Thursday: 1 Tablet      Friday: 1 Tablet     Saturday: 1 Tablet Total Weekly: 35.0mg/week mg  Next INR Due: 12/15/2009 Adjusted by: Jaegar Croft B. Rondrick Barreira III PharmD CACP   Return to anticoagulation clinic:  12/15/2009 Time of next visit: 0930   Comments: Patient wishes/insists--upon commencing drinking of "Green Tea"--(Known to have phyotadione [vit K]). We discussed the following: she will need to determine how much she chooses to drink daily--and how many times a week she wishes to do this--and then, MUST BE CONSISTENT in this frequency/amount. Once determined the impact of this amount of Green Tea upon her INR--she has been apprised that relative to the amount of warfarin increase necessary to overcome this effect--that she would have to CONSISTENTLY drink this amount. Will evaluate this dose-response relationship/effect in 2 weeks.   Allergies: 1)  ! Sulfa 2)  Pcn Prescriptions: WARFARIN SODIUM 5 MG TABS (WARFARIN SODIUM) Tablet Strength: 5mg Take as directed  #31 Tablet x 4   Entered by:   Jay Mort Smelser PharmD   Authorized by:   Elizabeth Butcher MD   Signed by:   Jay Thaniel Coluccio PharmD on 12/01/2009   Method used:   [facility] to        [facility]  [address] [id number]       [address]       [place], [place]  [address]       Ph: [phone]       Fax: [phone]   RxID:   [id number]

## 2010-12-08 NOTE — Progress Notes (Signed)
  Walk in Patient Form Recieved " Pt left BP readings" sent to Message Nurse [facility]  October 05, 2010 11:45 AM

## 2010-12-08 NOTE — Progress Notes (Signed)
Summary: Results  Phone Note Outgoing Call   Call placed by: Angelina Ok RN,  October 01, 2009 5:22 PM Call placed to: Patient Summary of Call: Call to pt message left that lab was fina and to call on Monday for questions and to be schduled withn Dr. Alexandria Lodge. . sign  Initial call taken by: Angelina Ok RN,  October 01, 2009 5:23 PM

## 2010-12-08 NOTE — Assessment & Plan Note (Signed)
Summary: COU/VS  Anticoagulant Therapy Managed by: [name]. Janie Morning  PharmD CACP PCP: Zoila Shutter MD [facility] Attending: [name] Indication 1: Deep vein thrombus Indication 2: Aftercare long term use Anticoagulants V58.61, V58.83 Start date: 03/25/2006 Duration: Indefinite  Patient Assessment Reviewed by: [name] PharmD  February 16, 2010 Medication review: verified warfarin dosage & schedule,verified previous prescription medications, verified doses & any changes, verified new medications, reviewed OTC medications, reviewed OTC health products-vitamins supplements etc Complications: none Dietary changes: none   Health status changes: none   Lifestyle changes: none   Recent/future hospitalizations: none   Recent/future procedures: none   Recent/future dental: none Patient Assessment Part 2:  Have you MISSED ANY DOSES or CHANGED TABLETS?  No missed Warfarin doses or changed tablets.  Have you had any BRUISING or BLEEDING ( nose or gum bleeds,blood in urine or stool)?  No reported bruising or bleeding in nose, gums, urine, stool.  Have you STARTED or STOPPED any MEDICATIONS, including OTC meds,herbals or supplements?  No other medications or herbal supplements were started or stopped.  Have you CHANGED your DIET, especially green vegetables,or ALCOHOL intake?  No changes in diet or alcohol intake.  Have you had any ILLNESSES or HOSPITALIZATIONS?  No reported illnesses or hospitalizations  Have you had any signs of CLOTTING?(chest discomfort,dizziness,shortness of breath,arms tingling,slurred speech,swelling or redness in leg)    No chest discomfort, dizziness, shortness of breath, tingling in arm, slurred speech, swelling, or redness in leg.     Treatment  Target INR: 2.0-3.0 INR: 2.2  Date: 02/16/2010 Regimen In:  27.5mg /week INR reflects regimen in: 2.2  New  Tablet strength: : 5mg  Regimen Out:     Sunday: 1 Tablet     Monday: 1 Tablet     Tuesday: 1  Tablet     Wednesday: 1 Tablet     Thursday: 1/2 Tablet      Friday: 1 Tablet     Saturday: 1/2 Tablet Total Weekly: 30mg /wk mg  Next INR Due: 03/16/2010 Adjusted by: [name]. [name] PharmD CACP   Return to anticoagulation clinic:  03/16/2010 Time of next visit: 0900    Allergies: 1)  ! Sulfa 2)  Pcn

## 2010-12-08 NOTE — Assessment & Plan Note (Signed)
Summary: 9AM COU APPT/CH  Anticoagulant Therapy Managed by: [name]. Janie Morning  PharmD CACP PCP: Zoila Shutter MD [facility] AttendingCoralee Pesa MD, Levada Schilling Indication 1: Deep vein thrombus Indication 2: Aftercare long term use Anticoagulants V58.61, V58.83 Start date: 03/25/2006 Duration: Indefinite  Patient Assessment Reviewed by: [name] PharmD  May 04, 2010 Medication review: verified warfarin dosage & schedule,verified previous prescription medications, verified doses & any changes, verified new medications, reviewed OTC medications, reviewed OTC health products-vitamins supplements etc Complications: none Dietary changes: none   Health status changes: none   Lifestyle changes: none   Recent/future hospitalizations: none   Recent/future procedures: none   Recent/future dental: none Patient Assessment Part 2:  Have you MISSED ANY DOSES or CHANGED TABLETS?  No missed Warfarin doses or changed tablets.  Have you had any BRUISING or BLEEDING ( nose or gum bleeds,blood in urine or stool)?  No reported bruising or bleeding in nose, gums, urine, stool.  Have you STARTED or STOPPED any MEDICATIONS, including OTC meds,herbals or supplements?  No other medications or herbal supplements were started or stopped.  Have you CHANGED your DIET, especially green vegetables,or ALCOHOL intake?  No changes in diet or alcohol intake.  Have you had any ILLNESSES or HOSPITALIZATIONS?  No reported illnesses or hospitalizations  Have you had any signs of CLOTTING?(chest discomfort,dizziness,shortness of breath,arms tingling,slurred speech,swelling or redness in leg)    No chest discomfort, dizziness, shortness of breath, tingling in arm, slurred speech, swelling, or redness in leg.     Treatment  Target INR: 2.0-3.0 INR: 1.8  Date: 05/04/2010 Regimen In:  30.0mg /week INR reflects regimen in: 1.8  New  Tablet strength: : 5mg  Regimen Out:     Sunday: 1 Tablet     Monday: 1 Tablet  Tuesday: 1 Tablet     Wednesday: 1 Tablet     Thursday: 1 Tablet      Friday: 1 Tablet     Saturday: 1 Tablet Total Weekly: 35.0mg /week mg  Next INR Due: 05/25/2010 Adjusted by: [name]. [name] PharmD CACP   Return to anticoagulation clinic:  05/25/2010 Time of next visit: 0930    Allergies: 1)  ! Sulfa 2)  Pcn

## 2010-12-08 NOTE — Miscellaneous (Signed)
  Clinical Lists Changes  Medications: Removed medication of WARFARIN SODIUM  TABS (WARFARIN SODIUM TABS) Tablet Strength: 5mg Take as directed 

## 2010-12-08 NOTE — Assessment & Plan Note (Signed)
Summary: COU/VS  Anticoagulant Therapy Managed by: [name]. [name]  PharmD CACP [facility] Attending: Madaline Guthrie MD Indication 1: Deep vein thrombus Indication 2: Aftercare long term use Anticoagulants V58.61, V58.83 Start date: 03/25/2006 Duration: Indefinite  Patient Assessment Reviewed by: [name] PharmD  October 23, 2007 Medication review: verified warfarin dosage & schedule,verified previous prescription medications, verified doses & any changes, verified new medications, reviewed OTC medications, reviewed OTC health products-vitamins supplements etc Complications: none Dietary changes: none   Health status changes: none   Lifestyle changes: none   Recent/future hospitalizations: none   Recent/future procedures: none   Recent/future dental: none Patient Assessment Part 2:  Have you MISSED ANY DOSES or CHANGED TABLETS?  No missed Warfarin doses or changed tablets.  Have you had any BRUISING or BLEEDING ( nose or gum bleeds,blood in urine or stool)?  No reported bruising or bleeding in nose, gums, urine, stool.  Have you STARTED or STOPPED any MEDICATIONS, including OTC meds,herbals or supplements?  No other medications or herbal supplements were started or stopped.  Have you CHANGED your DIET, especially green vegetables,or ALCOHOL intake?  No changes in diet or alcohol intake.  Have you had any ILLNESSES or HOSPITALIZATIONS?  No reported illnesses or hospitalizations  Have you had any signs of CLOTTING?(chest discomfort,dizziness,shortness of breath,arms tingling,slurred speech,swelling or redness in leg)    No chest discomfort, dizziness, shortness of breath, tingling in arm, slurred speech, swelling, or redness in leg.     Treatment  Target INR: 2.0-3.0 INR: 5.3  Date: 10/23/2007 Regimen In:  35.0mg /week INR reflects regimen in: 5.3  New  Tablet strength: : 5mg  Regimen Out:     Sunday: 1 Tablet     Monday: 1 Tablet     Tuesday: 1 Tablet     Wednesday: 0  Tablet     Thursday: 1 Tablet      Friday: 1 Tablet     Saturday: 1 Tablet Total Weekly: 30.0mg /week mg  Next INR Due: 10/30/2007 Adjusted by: [name]. [name] PharmD CACP   Return to anticoagulation clinic:  10/30/2007 Time of next visit: 0945  Hold:  2 Days

## 2010-12-08 NOTE — Assessment & Plan Note (Signed)
Summary: COU/VS  Anticoagulant Therapy Managed by: [name]. [name]  PharmD CACP [facility] Attending: Margarito Liner MD Indication 1: Deep vein thrombus Indication 2: Aftercare long term use Anticoagulants V58.61, V58.83 Start date: 03/25/2006 Duration: Indefinite  Patient Assessment Reviewed by: [name] PharmD  April 29, 2008 Medication review: verified warfarin dosage & schedule,verified previous prescription medications, verified doses & any changes, verified new medications, reviewed OTC medications, reviewed OTC health products-vitamins supplements etc Complications: none Dietary changes: none   Health status changes: none   Lifestyle changes: none   Recent/future hospitalizations: none   Recent/future procedures: none   Recent/future dental: none Patient Assessment Part 2:  Have you MISSED ANY DOSES or CHANGED TABLETS?  No missed Warfarin doses or changed tablets.  Have you had any BRUISING or BLEEDING ( nose or gum bleeds,blood in urine or stool)?  No reported bruising or bleeding in nose, gums, urine, stool.  Have you STARTED or STOPPED any MEDICATIONS, including OTC meds,herbals or supplements?  No other medications or herbal supplements were started or stopped.  Have you CHANGED your DIET, especially green vegetables,or ALCOHOL intake?  No changes in diet or alcohol intake.  Have you had any ILLNESSES or HOSPITALIZATIONS?  No reported illnesses or hospitalizations  Have you had any signs of CLOTTING?(chest discomfort,dizziness,shortness of breath,arms tingling,slurred speech,swelling or redness in leg)    No chest discomfort, dizziness, shortness of breath, tingling in arm, slurred speech, swelling, or redness in leg.     Treatment  Target INR: 2.0-3.0 INR: 2.1  Date: 04/29/2008 Regimen In:  30.0mg /week INR reflects regimen in: 2.1  New  Tablet strength: : 5mg  Regimen Out:     Sunday: 1 Tablet     Monday: 1 Tablet     Tuesday: 1 Tablet     Wednesday: 1/2  Tablet     Thursday: 1 Tablet      Friday: 1 Tablet     Saturday: 1 Tablet Total Weekly: 32.5mg /week mg  Next INR Due: 05/27/2008 Adjusted by: [name]. [name] PharmD CACP   Return to anticoagulation clinic:  05/27/2008 Time of next visit: 0900

## 2010-12-08 NOTE — Miscellaneous (Signed)
Summary: HIPPA  HIPPA   Imported By: Gentry Fitz 12/02/2008 12:12:37  _____________________________________________________________________  External Attachment:    Type:   Image     Comment:   External Document

## 2010-12-08 NOTE — Assessment & Plan Note (Signed)
Summary: 9 AM COU APPT/CH  Anticoagulant Therapy Managed by: [name]. Judith [name]  PharmD CACP PCP: Zoila Shutter MD [facility] AttendingCoralee Pesa MD, Levada Schilling Indication 1: Deep vein thrombus Indication 2: Aftercare long term use Anticoagulants V58.61, V58.83 Start date: 03/25/2006 Duration: Indefinite  Patient Assessment Reviewed by: [name] PharmD  June 22, 2010 Medication review: verified warfarin dosage & schedule,verified previous prescription medications, verified doses & any changes, verified new medications, reviewed OTC medications, reviewed OTC health products-vitamins supplements etc Complications: none Dietary changes: none   Health status changes: none   Lifestyle changes: none   Recent/future hospitalizations: none   Recent/future procedures: none   Recent/future dental: none Patient Assessment Part 2:  Have you MISSED ANY DOSES or CHANGED TABLETS?  No missed Warfarin doses or changed tablets.  Have you had any BRUISING or BLEEDING ( nose or gum bleeds,blood in urine or stool)?  No reported bruising or bleeding in nose, gums, urine, stool.  Have you STARTED or STOPPED any MEDICATIONS, including OTC meds,herbals or supplements?  No other medications or herbal supplements were started or stopped.  Have you CHANGED your DIET, especially green vegetables,or ALCOHOL intake?  No changes in diet or alcohol intake.  Have you had any ILLNESSES or HOSPITALIZATIONS?  No reported illnesses or hospitalizations  Have you had any signs of CLOTTING?(chest discomfort,dizziness,shortness of breath,arms tingling,slurred speech,swelling or redness in leg)    No chest discomfort, dizziness, shortness of breath, tingling in arm, slurred speech, swelling, or redness in leg.     Treatment  Target INR: 2.0-3.0 INR: 3.2  Date: 06/22/2010 Regimen In:  35.0mg /week INR reflects regimen in: 3.2  New  Tablet strength: : 5mg  Regimen Out:     Sunday: 1 Tablet     Monday: 1 Tablet    Tuesday: 1 Tablet     Wednesday: 1/2 Tablet     Thursday: 1 Tablet      Friday: 1 Tablet     Saturday: 1 Tablet Total Weekly: 32.5mg/week mg  Next INR Due: 07/20/2010 Adjusted by: Nelson Julson B. Daylene Vandenbosch III PharmD CACP   Return to anticoagulation clinic:  07/20/2010 Time of next visit: 0915    Allergies: 1)  ! Sulfa 2)  Pcn Prescriptions: WARFARIN SODIUM 5 MG TABS (WARFARIN SODIUM) Tablet Strength: 5mg Take as directed  #31 Tablet x 3   Entered by:   Jay Cregg Jutte PharmD   Authorized by:   Wynne E Woodyear MD   Signed by:   Jay Mariluz Crespo PharmD on 06/22/2010   Method used:   [facility]  [address] [id number]       [address]       [place], [place]  [address]       Ph: [phone]       Fax: [phone]   RxID:   [id number]

## 2010-12-08 NOTE — Assessment & Plan Note (Signed)
Summary: Coumadin Clinic  Anticoagulant Therapy Managed by: [name]. [name]  PharmD CACP OPC Attending: Margarito Liner MD Indication 1: Deep vein thrombus Indication 2: Aftercare long term use Anticoagulants V58.61, V58.83 Start date: 03/25/2006 Duration: Indefinite  Patient Assessment Reviewed by: [name] PharmD  March 03, 2009 Medication review: verified warfarin dosage & schedule,verified previous prescription medications, verified doses & any changes, verified new medications, reviewed OTC medications, reviewed OTC health products-vitamins supplements etc Complications: none Dietary changes: none   Health status changes: none   Lifestyle changes: none   Recent/future hospitalizations: none   Recent/future procedures: none   Recent/future dental: none Patient Assessment Part 2:  Have you MISSED ANY DOSES or CHANGED TABLETS?  No missed Warfarin doses or changed tablets.  Have you had any BRUISING or BLEEDING ( nose or gum bleeds,blood in urine or stool)?  No reported bruising or bleeding in nose, gums, urine, stool.  Have you STARTED or STOPPED any MEDICATIONS, including OTC meds,herbals or supplements?  No other medications or herbal supplements were started or stopped.  Have you CHANGED your DIET, especially green vegetables,or ALCOHOL intake?  No changes in diet or alcohol intake.  Have you had any ILLNESSES or HOSPITALIZATIONS?  No reported illnesses or hospitalizations  Have you had any signs of CLOTTING?(chest discomfort,dizziness,shortness of breath,arms tingling,slurred speech,swelling or redness in leg)    No chest discomfort, dizziness, shortness of breath, tingling in arm, slurred speech, swelling, or redness in leg.     Treatment  Target INR: 2.0-3.0 INR: 3.30  Date: 03/03/2009 Regimen In:  32.5mg /week INR reflects regimen in: 3.30  New  Tablet strength: : 5mg  Regimen Out:     Sunday: 1 Tablet     Monday: 1 Tablet     Tuesday: 1 Tablet  Wednesday: 0 Tablet     Thursday: 1 Tablet      Friday: 1 Tablet     Saturday: 1 Tablet Total Weekly: 30.0mg /week mg  Next INR Due: 04/14/2009 Adjusted by: [name]. [name] PharmD CACP   Return to anticoagulation clinic:  04/14/2009 Time of next visit: 0900    Allergies: 1)  ! Sulfa 2)  Pcn

## 2010-12-08 NOTE — Assessment & Plan Note (Signed)
Summary: 261/DS  Anticoagulant Therapy Managed by: [name]. Janie Morning  PharmD CACP PCP: Zoila Shutter MD [facility] AttendingCoralee Pesa MD, Levada Schilling Indication 1: Deep vein thrombus Indication 2: Aftercare long term use Anticoagulants V58.61, V58.83 Start date: 03/25/2006 Duration: Indefinite  Patient Assessment Reviewed by: [name] PharmD  April 13, 2010 Medication review: verified warfarin dosage & schedule,verified previous prescription medications, verified doses & any changes, verified new medications, reviewed OTC medications, reviewed OTC health products-vitamins supplements etc Complications: none Dietary changes: none   Health status changes: none   Lifestyle changes: none   Recent/future hospitalizations: none   Recent/future procedures: none   Recent/future dental: none Patient Assessment Part 2:  Have you MISSED ANY DOSES or CHANGED TABLETS?  No missed Warfarin doses or changed tablets.  Have you had any BRUISING or BLEEDING ( nose or gum bleeds,blood in urine or stool)?  No reported bruising or bleeding in nose, gums, urine, stool.  Have you STARTED or STOPPED any MEDICATIONS, including OTC meds,herbals or supplements?  No other medications or herbal supplements were started or stopped.  Have you CHANGED your DIET, especially green vegetables,or ALCOHOL intake?  No changes in diet or alcohol intake.  Have you had any ILLNESSES or HOSPITALIZATIONS?  No reported illnesses or hospitalizations  Have you had any signs of CLOTTING?(chest discomfort,dizziness,shortness of breath,arms tingling,slurred speech,swelling or redness in leg)    No chest discomfort, dizziness, shortness of breath, tingling in arm, slurred speech, swelling, or redness in leg.     Treatment  Target INR: 2.0-3.0 INR: 2.30  Date: 04/13/2010 Regimen In:  30mg /wk INR reflects regimen in: 2.30  New  Tablet strength: : 5mg  Regimen Out:     Sunday: 1 Tablet     Monday: 1 Tablet     Tuesday: 1  Tablet     Wednesday: 0 Tablet     Thursday: 1 Tablet      Friday: 1 Tablet     Saturday: 1 Tablet Total Weekly: 30.0mg /week mg  Next INR Due: 05/04/2010 Adjusted by: [name]. [name] PharmD CACP   Return to anticoagulation clinic:  05/04/2010 Time of next visit: 0900    Allergies: 1)  ! Sulfa 2)  Pcn

## 2010-12-08 NOTE — Assessment & Plan Note (Signed)
Summary: COU/CH  Anticoagulant Therapy Managed by: [name]. [name]  PharmD CACP O[facility] Attending: Eliseo Gum MD Indication 1: Deep vein thrombus Indication 2: Aftercare long term use Anticoagulants V58.61, V58.83 Start date: 03/25/2006 Duration: Indefinite  Patient Assessment Reviewed by: [name] PharmD  October 30, 2007 Medication review: verified warfarin dosage & schedule,verified previous prescription medications, verified doses & any changes, verified new medications, reviewed OTC medications, reviewed OTC health products-vitamins supplements etc Complications: none Dietary changes: none   Health status changes: none   Lifestyle changes: none   Recent/future hospitalizations: none   Recent/future procedures: none   Recent/future dental: none Patient Assessment Part 2:  Have you MISSED ANY DOSES or CHANGED TABLETS?  No missed Warfarin doses or changed tablets.  Have you had any BRUISING or BLEEDING ( nose or gum bleeds,blood in urine or stool)?  No reported bruising or bleeding in nose, gums, urine, stool.  Have you STARTED or STOPPED any MEDICATIONS, including OTC meds,herbals or supplements?  No other medications or herbal supplements were started or stopped.  Have you CHANGED your DIET, especially green vegetables,or ALCOHOL intake?  No changes in diet or alcohol intake.  Have you had any ILLNESSES or HOSPITALIZATIONS?  No reported illnesses or hospitalizations  Have you had any signs of CLOTTING?(chest discomfort,dizziness,shortness of breath,arms tingling,slurred speech,swelling or redness in leg)    No chest discomfort, dizziness, shortness of breath, tingling in arm, slurred speech, swelling, or redness in leg.     Treatment  Target INR: 2.0-3.0 INR: 1.8  Date: 10/30/2007 Regimen In:  30.0mg /week INR reflects regimen in: 1.8  New  Tablet strength: : 5mg  Regimen Out:     Sunday: 1 Tablet     Monday: 1 Tablet     Tuesday: 1 Tablet     Wednesday:  1/2 Tablet     Thursday: 1 Tablet      Friday: 1 Tablet     Saturday: 1 Tablet Total Weekly: 32.5mg /week mg  Next INR Due: 11/27/2007 Adjusted by: [name]. [name] PharmD CACP   Return to anticoagulation clinic:  11/27/2007 Time of next visit: 0915

## 2010-12-08 NOTE — Assessment & Plan Note (Signed)
Summary: COU/CH  Anticoagulant Therapy Managed by: [name]. Janie Morning  PharmD CACP OPC Attending: Margarito Liner MD Indication 1: Deep vein thrombus Indication 2: Aftercare long term use Anticoagulants V58.61, V58.83 Start date: 03/25/2006 Duration: Indefinite  Patient Assessment Reviewed by: [name] PharmD  August 11, 2009 Medication review: verified warfarin dosage & schedule,verified previous prescription medications, verified doses & any changes, verified new medications, reviewed OTC medications, reviewed OTC health products-vitamins supplements etc Complications: none Dietary changes: none   Health status changes: yes  Details: Past 3 days (Since Friday last week) has had GI symptoms--diarrhea, some emesis. Husband has had similar symptoms. Denies fever. NO BLOOD in stool. Lifestyle changes: none   Recent/future hospitalizations: none   Recent/future procedures: none   Recent/future dental: none Patient Assessment Part 2:  Have you MISSED ANY DOSES or CHANGED TABLETS?  No missed Warfarin doses or changed tablets.  Have you had any BRUISING or BLEEDING ( nose or gum bleeds,blood in urine or stool)?  No reported bruising or bleeding in nose, gums, urine, stool.  Have you STARTED or STOPPED any MEDICATIONS, including OTC meds,herbals or supplements?  No other medications or herbal supplements were started or stopped.  Have you CHANGED your DIET, especially green vegetables,or ALCOHOL intake?  No changes in diet or alcohol intake.  Have you had any ILLNESSES or HOSPITALIZATIONS?  No reported illnesses or hospitalizations  Have you had any signs of CLOTTING?(chest discomfort,dizziness,shortness of breath,arms tingling,slurred speech,swelling or redness in leg)    No chest discomfort, dizziness, shortness of breath, tingling in arm, slurred speech, swelling, or redness in leg.     Treatment  Target INR: 2.0-3.0 INR: 3.7  Date: 08/11/2009 Regimen In:  30.0mg /week INR  reflects regimen in: 3.7  New  Tablet strength: : 5mg  Regimen Out:     Sunday: 0 Tablet     Monday: 1 Tablet     Tuesday: 1 Tablet     Wednesday: 1/2 Tablet     Thursday: 1 Tablet      Friday: 1 Tablet     Saturday: 1 Tablet Total Weekly: 27.5mg /week mg  Next INR Due: 09/01/2009   Return to anticoagulation clinic:  09/01/2009  Hold:  1 Days    Comments: Denies any signs/symptoms of jaundice.  Allergies: 1)  ! Sulfa 2)  Pcn

## 2010-12-08 NOTE — Assessment & Plan Note (Signed)
Summary: 261/DS  Anticoagulant Therapy Managed by: [name]. Judith [name]  PharmD CACP OPC Attending: [name] Indication 1: Deep vein thrombus Indication 2: Aftercare long term use Anticoagulants V58.61, V58.83 Start date: 03/25/2006 Duration: Indefinite  Patient Assessment Reviewed by: [name] PharmD  January 27, 2009 Medication review: verified warfarin dosage & schedule,verified previous prescription medications, verified doses & any changes, verified new medications, reviewed OTC medications, reviewed OTC health products-vitamins supplements etc Complications: none Dietary changes: none   Health status changes: none   Lifestyle changes: none   Recent/future hospitalizations: none   Recent/future procedures: none   Recent/future dental: none Patient Assessment Part 2:  Have you MISSED ANY DOSES or CHANGED TABLETS?  No missed Warfarin doses or changed tablets.  Have you had any BRUISING or BLEEDING ( nose or gum bleeds,blood in urine or stool)?  No reported bruising or bleeding in nose, gums, urine, stool.  Have you STARTED or STOPPED any MEDICATIONS, including OTC meds,herbals or supplements?  No other medications or herbal supplements were started or stopped.  Have you CHANGED your DIET, especially green vegetables,or ALCOHOL intake?  No changes in diet or alcohol intake.  Have you had any ILLNESSES or HOSPITALIZATIONS?  No reported illnesses or hospitalizations  Have you had any signs of CLOTTING?(chest discomfort,dizziness,shortness of breath,arms tingling,slurred speech,swelling or redness in leg)    No chest discomfort, dizziness, shortness of breath, tingling in arm, slurred speech, swelling, or redness in leg.     Treatment  Target INR: 2.0-3.0 INR: 2.4  Date: 01/27/2009 Regimen In:  32.5mg /week INR reflects regimen in: 2.4  New  Tablet strength: : 5mg  Regimen Out:     Sunday: 1 Tablet     Monday: 1 Tablet     Tuesday: 1 Tablet     Wednesday: 1/2  Tablet     Thursday: 1 Tablet      Friday: 1 Tablet     Saturday: 1 Tablet Total Weekly: 32.5mg /week mg  Next INR Due: 03/03/2009 Adjusted by: [name]. [name] PharmD CACP   Return to anticoagulation clinic:  03/03/2009 Time of next visit: 0900

## 2010-12-08 NOTE — Assessment & Plan Note (Signed)
Summary: 930 AM COU APPT/CH  Anticoagulant Therapy Managed by: [name]. Judith [name]  PharmD CACP PCP: Zoila Shutter MD [facility] AttendingCoralee Pesa MD, Levada Schilling Indication 1: Deep vein thrombus Indication 2: Aftercare long term use Anticoagulants V58.61, V58.83 Start date: 03/25/2006 Duration: Indefinite  Patient Assessment Reviewed by: [name] PharmD  May 25, 2010 Medication review: verified warfarin dosage & schedule,verified previous prescription medications, verified doses & any changes, verified new medications, reviewed OTC medications, reviewed OTC health products-vitamins supplements etc Complications: none Dietary changes: none   Health status changes: none   Lifestyle changes: none   Recent/future hospitalizations: none   Recent/future procedures: none   Recent/future dental: none Patient Assessment Part 2:  Have you MISSED ANY DOSES or CHANGED TABLETS?  No missed Warfarin doses or changed tablets.  Have you had any BRUISING or BLEEDING ( nose or gum bleeds,blood in urine or stool)?  No reported bruising or bleeding in nose, gums, urine, stool.  Have you STARTED or STOPPED any MEDICATIONS, including OTC meds,herbals or supplements?  No other medications or herbal supplements were started or stopped.  Have you CHANGED your DIET, especially green vegetables,or ALCOHOL intake?  No changes in diet or alcohol intake.  Have you had any ILLNESSES or HOSPITALIZATIONS?  No reported illnesses or hospitalizations  Have you had any signs of CLOTTING?(chest discomfort,dizziness,shortness of breath,arms tingling,slurred speech,swelling or redness in leg)    No chest discomfort, dizziness, shortness of breath, tingling in arm, slurred speech, swelling, or redness in leg.     Treatment  Target INR: 2.0-3.0 INR: 2.8  Date: 05/25/2010 Regimen In:  35.0mg /week INR reflects regimen in: 2.8  New  Tablet strength: : 5mg  Regimen Out:     Sunday: 1 Tablet     Monday: 1 Tablet    Tuesday: 1 Tablet     Wednesday: 1 Tablet     Thursday: 1 Tablet      Friday: 1 Tablet     Saturday: 1 Tablet Total Weekly: 35.0mg /week mg  Next INR Due: 06/22/2010 Adjusted by: [name]. [name] PharmD CACP   Return to anticoagulation clinic:  06/22/2010 Time of next visit: 0900    Allergies: 1)  ! Sulfa 2)  Pcn

## 2010-12-08 NOTE — Assessment & Plan Note (Signed)
Summary: COU/VS  Anticoagulant Therapy Managed by: [name]. Judith [name]  PharmD CACP OPC Attending: [name] Indication 1: Deep vein thrombus Indication 2: Aftercare long term use Anticoagulants V58.61, V58.83 Start date: 03/25/2006 Duration: Indefinite  Patient Assessment Reviewed by: [name] PharmD  November 27, 2007 Medication review: verified warfarin dosage & schedule,verified previous prescription medications, verified doses & any changes, verified new medications, reviewed OTC medications, reviewed OTC health products-vitamins supplements etc Complications: none Dietary changes: none   Health status changes: none   Lifestyle changes: none   Recent/future hospitalizations: none   Recent/future procedures: none   Recent/future dental: none Patient Assessment Part 2:  Have you MISSED ANY DOSES or CHANGED TABLETS?  No missed Warfarin doses or changed tablets.  Have you had any BRUISING or BLEEDING ( nose or gum bleeds,blood in urine or stool)?  No reported bruising or bleeding in nose, gums, urine, stool.  Have you STARTED or STOPPED any MEDICATIONS, including OTC meds,herbals or supplements?  No other medications or herbal supplements were started or stopped.  Have you CHANGED your DIET, especially green vegetables,or ALCOHOL intake?  No changes in diet or alcohol intake.  Have you had any ILLNESSES or HOSPITALIZATIONS?  No reported illnesses or hospitalizations  Have you had any signs of CLOTTING?(chest discomfort,dizziness,shortness of breath,arms tingling,slurred speech,swelling or redness in leg)    No chest discomfort, dizziness, shortness of breath, tingling in arm, slurred speech, swelling, or redness in leg.     Treatment  Target INR: 2.0-3.0 INR: 3.2  Date: 11/27/2007 Regimen In:  32.5mg /week INR reflects regimen in: 3.2  New  Tablet strength: : 5mg  Regimen Out:     Sunday: 1 Tablet     Monday: 1/2 Tablet     Tuesday: 1 Tablet     Wednesday:  1/2 Tablet     Thursday: 1 Tablet      Friday: 1 Tablet     Saturday: 1 Tablet Total Weekly: 30.0mg /week mg  Next INR Due: 12/25/2007 Adjusted by: [name]. [name] PharmD CACP   Return to anticoagulation clinic:  12/25/2007 Time of next visit: 0945

## 2010-12-08 NOTE — Assessment & Plan Note (Signed)
Summary: COU/VS  Anticoagulant Therapy Managed by: [name]. [name]  PharmD CACP OPC Attending: Eliseo Gum MD Indication 1: Deep vein thrombus Indication 2: Aftercare long term use Anticoagulants V58.61, V58.83 Start date: 03/25/2006 Duration: Indefinite  Patient Assessment Reviewed by: [name] PharmD  December 25, 2007 Medication review: verified warfarin dosage & schedule,verified previous prescription medications, verified doses & any changes, verified new medications, reviewed OTC medications, reviewed OTC health products-vitamins supplements etc Complications: none Dietary changes: none   Health status changes: none   Lifestyle changes: none   Recent/future hospitalizations: none   Recent/future procedures: none   Recent/future dental: none Patient Assessment Part 2:  Have you MISSED ANY DOSES or CHANGED TABLETS?  No missed Warfarin doses or changed tablets.  Have you had any BRUISING or BLEEDING ( nose or gum bleeds,blood in urine or stool)?  No reported bruising or bleeding in nose, gums, urine, stool.  Have you STARTED or STOPPED any MEDICATIONS, including OTC meds,herbals or supplements?  No other medications or herbal supplements were started or stopped.  Have you CHANGED your DIET, especially green vegetables,or ALCOHOL intake?  No changes in diet or alcohol intake.  Have you had any ILLNESSES or HOSPITALIZATIONS?  No reported illnesses or hospitalizations  Have you had any signs of CLOTTING?(chest discomfort,dizziness,shortness of breath,arms tingling,slurred speech,swelling or redness in leg)    No chest discomfort, dizziness, shortness of breath, tingling in arm, slurred speech, swelling, or redness in leg.     Treatment  Target INR: 2.0-3.0 INR: 2.3  Date: 12/25/2007 Regimen In:  30.0mg /week INR reflects regimen in: 2.3  New  Tablet strength: : 5mg  Regimen Out:     Sunday: 1 Tablet     Monday: 1/2 Tablet     Tuesday: 1 Tablet  Wednesday: 1/2 Tablet     Thursday: 1 Tablet      Friday: 1 Tablet     Saturday: 1 Tablet Total Weekly: 30.0mg /week mg  Next INR Due: 01/22/2008 Adjusted by: [name]. [name] PharmD CACP   Return to anticoagulation clinic:  01/22/2008 Time of next visit: 0915

## 2010-12-08 NOTE — Assessment & Plan Note (Signed)
Summary: COU/CH  Anticoagulant Therapy Managed by: [name]. [name]  PharmD CACP OPC Attending: Manning Charity MD Indication 1: Deep vein thrombus Indication 2: Aftercare long term use Anticoagulants V58.61, V58.83 Start date: 03/25/2006 Duration: Indefinite  Patient Assessment Reviewed by: [name] PharmD  May 22, 2007 Medication review: verified warfarin dosage & schedule,verified previous prescription medications, verified doses & any changes, verified new medications, reviewed OTC medications, reviewed OTC health products-vitamins supplements etc Complications: none Dietary changes: none   Health status changes: none   Lifestyle changes: none   Recent/future hospitalizations: none   Recent/future procedures: none   Recent/future dental: none Patient Assessment Part 2:  Have you MISSED ANY DOSES or CHANGED TABLETS?  No missed Warfarin doses or changed tablets.  Have you had any BRUISING or BLEEDING ( nose or gum bleeds,blood in urine or stool)?  No reported bruising or bleeding in nose, gums, urine, stool.  Have you STARTED or STOPPED any MEDICATIONS, including OTC meds,herbals or supplements?  No other medications or herbal supplements were started or stopped.  Have you CHANGED your DIET, especially green vegetables,or ALCOHOL intake?  No changes in diet or alcohol intake.  Have you had any ILLNESSES or HOSPITALIZATIONS?  No reported illnesses or hospitalizations  Have you had any signs of CLOTTING?(chest discomfort,dizziness,shortness of breath,arms tingling,slurred speech,swelling or redness in leg)    No chest discomfort, dizziness, shortness of breath, tingling in arm, slurred speech, swelling, or redness in leg.     Treatment  Target INR: 2.0-3.0 INR: 3.90  Date: 05/22/2007 Regimen In:  40.0mg /week INR reflects regimen in: 3.90  New  Tablet strength: : 5mg  Regimen Out:     Sunday: 1 Tablet     Monday: 1/2 Tablet     Tuesday: 1 Tablet     Wednesday:  1 Tablet     Thursday: 1/2 Tablet      Friday: 1 Tablet     Saturday: 1 Tablet Total Weekly: 30.0mg /week mg  Next INR Due: 06/19/2007 Adjusted by: [name]. [name] PharmD CACP   Return to anticoagulation clinic:  06/19/2007 Time of next visit: 0915

## 2010-12-08 NOTE — Assessment & Plan Note (Signed)
Summary: COU/CH  Anticoagulant Therapy Managed by: [name]. Janie Morning  PharmD CACP PCP: Zoila Shutter MD [facility] Attending: Margarito Liner MD Indication 1: Deep vein thrombus Indication 2: Aftercare long term use Anticoagulants V58.61, V58.83 Start date: 03/25/2006 Duration: Indefinite  Patient Assessment Reviewed by: [name] PharmD  July 20, 2010 Medication review: verified warfarin dosage & schedule,verified previous prescription medications, verified doses & any changes, verified new medications, reviewed OTC medications, reviewed OTC health products-vitamins supplements etc Complications: none Dietary changes: none   Health status changes: none   Lifestyle changes: none   Recent/future hospitalizations: none   Recent/future procedures: none   Recent/future dental: none Patient Assessment Part 2:  Have you MISSED ANY DOSES or CHANGED TABLETS?  No missed Warfarin doses or changed tablets.  Have you had any BRUISING or BLEEDING ( nose or gum bleeds,blood in urine or stool)?  No reported bruising or bleeding in nose, gums, urine, stool.  Have you STARTED or STOPPED any MEDICATIONS, including OTC meds,herbals or supplements?  No other medications or herbal supplements were started or stopped.  Have you CHANGED your DIET, especially green vegetables,or ALCOHOL intake?  No changes in diet or alcohol intake.  Have you had any ILLNESSES or HOSPITALIZATIONS?  No reported illnesses or hospitalizations  Have you had any signs of CLOTTING?(chest discomfort,dizziness,shortness of breath,arms tingling,slurred speech,swelling or redness in leg)    No chest discomfort, dizziness, shortness of breath, tingling in arm, slurred speech, swelling, or redness in leg.     Treatment  Target INR: 2.0-3.0 INR: 3.1  Date: 07/20/2010 Regimen In:  32.5mg /week INR reflects regimen in: 3.1  New  Tablet strength: : 5mg  Regimen Out:     Sunday: 1 Tablet     Monday: 1 Tablet     Tuesday:  1 Tablet     Wednesday: 1/2 Tablet     Thursday: 1 Tablet      Friday: 1 Tablet     Saturday: 1 Tablet Total Weekly: 32.5mg /week mg  Next INR Due: 08/18/2010 Adjusted by: [name]. [name] PharmD CACP   Return to anticoagulation clinic:  08/18/2010 Time of next visit: 0915   Comments: Patient states she has an appointment with Dr. Coralee Pesa on 11-Oct-11. Will see her then (would normally have seen her one day earlier, 10-Oct-11)  Allergies: 1)  ! Sulfa 2)  Pcn

## 2010-12-08 NOTE — Assessment & Plan Note (Signed)
Summary: COU/CH  Anticoagulant Therapy Managed by: [name]. Janie Morning  PharmD CACP PCP: Zoila Shutter MD [facility] AttendingCoralee Pesa MD, Levada Schilling Indication 1: Deep vein thrombus Indication 2: Aftercare long term use Anticoagulants V58.61, V58.83 Start date: 03/25/2006 Duration: Indefinite  Patient Assessment Reviewed by: [name] PharmD  Mar 16, 2010 Medication review: verified warfarin dosage & schedule,verified previous prescription medications, verified doses & any changes, verified new medications, reviewed OTC medications, reviewed OTC health products-vitamins supplements etc Complications: none Dietary changes: none   Health status changes: none   Lifestyle changes: none   Recent/future hospitalizations: none   Recent/future procedures: none   Recent/future dental: none Patient Assessment Part 2:  Have you MISSED ANY DOSES or CHANGED TABLETS?  No missed Warfarin doses or changed tablets.  Have you had any BRUISING or BLEEDING ( nose or gum bleeds,blood in urine or stool)?  No reported bruising or bleeding in nose, gums, urine, stool.  Have you STARTED or STOPPED any MEDICATIONS, including OTC meds,herbals or supplements?  No other medications or herbal supplements were started or stopped.  Have you CHANGED your DIET, especially green vegetables,or ALCOHOL intake?  No changes in diet or alcohol intake.  Have you had any ILLNESSES or HOSPITALIZATIONS?  No reported illnesses or hospitalizations  Have you had any signs of CLOTTING?(chest discomfort,dizziness,shortness of breath,arms tingling,slurred speech,swelling or redness in leg)    No chest discomfort, dizziness, shortness of breath, tingling in arm, slurred speech, swelling, or redness in leg.     Treatment  Target INR: 2.0-3.0 INR: 2.5  Date: 03/16/2010 Regimen In:  30mg /wk INR reflects regimen in: 2.5  New  Tablet strength: : 5mg  Regimen Out:     Sunday: 1 Tablet     Monday: 1 Tablet     Tuesday: 1  Tablet     Wednesday: 1 Tablet     Thursday: 1/2 Tablet      Friday: 1 Tablet     Saturday: 1/2 Tablet Total Weekly: 30mg /wk mg  Next INR Due: 04/13/2010 Adjusted by: [name]. [name] PharmD CACP   Return to anticoagulation clinic:  04/13/2010 Time of next visit: 0900    Allergies: 1)  ! Sulfa 2)  Pcn

## 2010-12-08 NOTE — Progress Notes (Signed)
Summary: appt at [facility]  ---- Converted from flag ---- ---- 09/28/2010 2:06 PM, Omar Person wrote: 09-28-10 @ 2:50 with Dr. Jearld Fenton with [facility] Omar Person  September 28, 2010 9:39 AM info faxed to [facility] Omar Person  September 28, 2010 2:05 PM  ---- 09/28/2010 9:21 AM, Katina Dung, RN, BSN wrote: The following orders have been entered for this patient and placed on Admin Hold:  Type:     Referral       Code:   Misc. Ref Description:   Misc. Referral Order Date:   09/28/2010   Authorized By:   Marca Ancona, MD Order #:   [id number] Clinical Notes:   Type of Referral: Shortness of breath Reason: deviated septum ------------------------------

## 2010-12-08 NOTE — Miscellaneous (Signed)
Summary: Regimen changed  Clinical Lists Changes  Medications: Removed medication of WARFARIN SODIUM 5 MG TABS (WARFARIN SODIUM) Use as directed by Dr. Alexandria Lodge, [facility]

## 2010-12-08 NOTE — Assessment & Plan Note (Signed)
Summary: COU/DF  Anticoagulant Therapy Managed by: [name]. Janie Morning  PharmD CACP OPC Attending: Margarito Liner MD Indication 1: Deep vein thrombus Indication 2: Aftercare long term use Anticoagulants V58.61, V58.83 Start date: 03/25/2006 Duration: Indefinite  Patient Assessment Reviewed by: [name] PharmD  February 12, 2008 Medication review: verified warfarin dosage & schedule,verified previous prescription medications, verified doses & any changes, verified new medications, reviewed OTC medications, reviewed OTC health products-vitamins supplements etc Complications: none, thromboembolic Comments: Patient has an area of erythema/ecchymosis over her left medial heel area--posterior to her left medial malleolus. She describes it as an Administrator, sports which she denies trauma. States the area does not drain any purlulent material. She is scheduled to see Dr. Okey Dupre this afternoon. Dietary changes: none   Health status changes: none   Lifestyle changes: none   Recent/future hospitalizations: none   Recent/future procedures: none   Recent/future dental: none Patient Assessment Part 2:  Have you MISSED ANY DOSES or CHANGED TABLETS?  No missed Warfarin doses or changed tablets.  Have you had any BRUISING or BLEEDING ( nose or gum bleeds,blood in urine or stool)?  No reported bruising or bleeding in nose, gums, urine, stool.  Have you STARTED or STOPPED any MEDICATIONS, including OTC meds,herbals or supplements?  No other medications or herbal supplements were started or stopped.  Have you CHANGED your DIET, especially green vegetables,or ALCOHOL intake?  No changes in diet or alcohol intake.  Have you had any ILLNESSES or HOSPITALIZATIONS?  No reported illnesses or hospitalizations  Have you had any signs of CLOTTING?(chest discomfort,dizziness,shortness of breath,arms tingling,slurred speech,swelling or redness in leg)    Has an ulcerative area on her left heel (medial aspect) for  which she will see Dr. Okey Dupre this afternoon.    Treatment  Target INR: 2.0-3.0 INR: 2.5  Date: 02/12/2008 Regimen In:  35.0mg /week INR reflects regimen in: 2.5  New  Tablet strength: : 5mg  Regimen Out:     Sunday: 1 Tablet     Monday: 1 Tablet     Tuesday: 1 Tablet     Wednesday: 1 Tablet     Thursday: 1 Tablet      Friday: 1 Tablet     Saturday: 1 Tablet Total Weekly: 35.0mg /week mg  Next INR Due: 03/18/2008 Adjusted by: [name]. [name] PharmD CACP   Return to anticoagulation clinic:  03/18/2008 Time of next visit: 0900

## 2010-12-08 NOTE — Assessment & Plan Note (Signed)
Summary: RA/ROUTINE CK/VS   Vital Signs:  Patient Profile:   75 Years Old Female Height:     65 inches (165.10 cm) Weight:      214.1 pounds (97.32 kg) BMI:     35.76 Temp:     97.8 degrees F (36.56 degrees C) oral Pulse rate:   85 / minute BP sitting:   158 / 82  (right arm)  Pt. in pain?   no  Vitals Entered By: Filomena Jungling (Mar 19, 2008 3:05 PM)              Is Patient Diabetic? No Nutritional Status BMI of > 30 = obese  Have you ever been in a relationship where you felt threatened, hurt or afraid?No   Does patient need assistance? Functional Status Self care Ambulation Normal     Chief Complaint:  CHECK-UP.  History of Present Illness: This is my 1st office visit with Judith [name] who has been newly assigned to me. She is a 75 yo woman with multiple medical problems.  1. DVT/PE with Factor 5 Leiden, on Coumadin, followed by Dr Alexandria Lodge. 2. Osteoporosis - On bisphosphanate, calcium and vit D 3. Cervical Cancer, h/o, followed by Dr Lelon Perla 4. Stress incontinence - Improved with Detrol LA 5. Ulcer, L medial malleolus - Pt has been seen twice in [facility] and treated both times with abx (doxy and keflex). Ulcer persists. She self-referred herself toDr Sondra Come at [facility]. she says he has done dopplers on the leg and he is going to do other "tests" and decide on "treatment". 6. Health Maintenance - Gets Paps by Dr Elana Alm. Gets yearly  mammograms at [facility] and has had one within past year. Also gets her DXA scans at [facility]. She had a colonoscopy done by  Dr Loreta Ave 2001. 7. Hyperlipidemia - Unclear if she has a positive family history - thinks her father died of an MI. Both  mom and sisters lived into their 89's without heart problems.    Current Allergies: ! SULFA PCN  Past Medical History:    Reviewed history from 02/28/2008 and no changes required:        1.Bilateral distal vein thrombosis, chronic on the left, acute on chronic            on the right.  2.  Pulmonary embolus.        3.  History of cervical cancer in 1983, recurrence January 2006, status post            radiation therapy, partial hysterectomy and colposcopy in 1983, and            upper vaginectomy in January 2006.        4.  Osteoporosis.        5.  Chronic sinusitis.        6.  Stress incontinence.        7.  Hernia repair.        8.  Nasal septum repair.        9.  Hand and shoulder repair.        10. Status post colonoscopy in 2001 that was normal.        11. Hypertension while in the hospital.    Risk Factors: Tobacco use:  never Alcohol use:  no Exercise:  no Seatbelt use:  100 %  Colonoscopy History:     Date of Last Colonoscopy:  10/11/2000    Results:  Results: Hemorrhoids.  Review of Systems  General      Denies chills and fever.  Eyes      Denies vision loss-both eyes.  CV      Complains of palpitations.      Denies chest pain or discomfort and difficulty breathing at night.      occasional skipped beats, lasts seconds  Resp      Denies cough, shortness of breath, and sputum productive.  GI      Denies abdominal pain, change in bowel habits, constipation, and dark tarry stools.  GU      Denies dysuria.  Judith      Denies joint pain.  Derm      Complains of poor wound healing.      see hpi  Neuro      Denies numbness and weakness.  Endo      Complains of weight change.      wt gain   Physical Exam  General:     alert and overweight-appearing.   Lungs:     normal respiratory effort and normal breath sounds.   Heart:     normal rate and regular rhythm.   Abdomen:     soft, non-tender, and normal bowel sounds.   Pulses:     Bilateral pedal pulses intact Extremities:     no edema Neurologic:     sensation intact to pinprick.   Skin:     Healing ulcer on L. medial malleolus with eschar, no drainage, surrounding erythema without tenderness; feet warm, good pulses     Impression & Recommendations:  Problem  # 1:  ULCER, LEG (ICD-707.10) It does not appear that this has changed much since her last visits by the dexcription in the physical exams. No signs of infection. I am not sure why she has this ulcer. I will get records from Dr Sondra Come to see what has actually been done. Had plain film done and it was negative for evidence of bone involvement.  Problem # 2:  HYPERLIPIDEMIA (ICD-272.4) FLP done 4/09: LDL 126/ HDL 58/ TG 195. I discussed with patient. Only cardiac risk factor is obesity. Will not start tx at this time.  Problem # 3:  HYPERTENSION (ICD-401.9) BP 158/82. On no meds. In reviewing chart, she has had SBP's 140 - 150 and DBP  ~80. Will address at next visit if BP remains elevated.  Problem # 4:  OSTEOPOROSIS (ICD-733.00) Will get copy of DXA. Continue current tx. Her updated medication list for this problem includes:    Actonel 35 Mg Tabs (Risedronate sodium) .Marland Kitchen... Take 1 tablet by mouth once a day    Caltrate 600+d Tabs (Calcium carbonate-vitamin d tabs) .Marland Kitchen... Take 1 tablet by mouth two times a day   Problem # 5:  STRESS INCONTINENCE (ICD-788.39) Improved with Detrol LA. Continue.  Problem # 6:  CERVICAL CANCER, HX OF (ICD-V10.41) Followed by Dr Lelon Perla.  Problem # 7:  Preventive Health Care (ICD-V70.0) As outlined in HPI. Will order Zoster vax for her. Discussed repeat colonoscopy with her but she is not interested. Agreed to hemoccult cards.  Complete Medication List: 1)  Actonel 35 Mg Tabs (Risedronate sodium) .... Take 1 tablet by mouth once a day 2)  Detrol La 4 Mg Cp24 (Tolterodine tartrate) .... Take 1 tablet by mouth once a day 3)  Fexofenadine Hcl 180 Mg Tabs (Fexofenadine hcl) .... Take 1 tablet by mouth once a day 4)  Caltrate 600+d Tabs (Calcium carbonate-vitamin d tabs) .... Take 1  tablet by mouth two times a day 5)  Centrum Tabs (Multiple vitamins-minerals) .... Take 1 tablet by mouth once a day 6)  Vitamin C Tabs (Ascorbic acid tabs) .... Take 1 tablet by mouth  once a day 7)  Famotidine 10 Mg Tabs (Famotidine) .... Take 1 tablet by mouth once a day 8)  Warfarin Sodium Tabs (Warfarin sodium tabs) .... Tablet strength: 5mg  take as directed 9)  Joint Support Formula Caps (Glucos-chondroit-collag-hyal) .... 2 caps two times a day 10)  Aleve 220 Mg Tabs (Naproxen sodium) .Marland Kitchen.. 1-2 tabs as needed 11)  Sudafed Pe Maximum Strength 10 Mg Tabs (Phenylephrine hcl) .... 2 tabs qid as needed for sinus symptoms. 12)  Flonase 50 Mcg/act Susp (Fluticasone propionate) .... 2 sprays per nostril once daily 13)  Medium Compression Hose Misc (elastic Bandages & Supports)  .... Apply daily in am as directed.   Patient Instructions: 1)  Please schedule a follow-up appointment in 3 months or sooner if needed. 2)  We will call you when the shingles vaccine is here. 3)  Remember to send your stool cards back in. 4)  Call if the ulcer on your leg worsens. Continue the same care you have been (leg elevation when possible, support hose when on your feet). 5)  Try to walk some every day.   ]  Colonoscopy  Procedure date:  10/11/2000  Findings:      Results: Hemorrhoids.       Procedures Next Due Date:    Colonoscopy: 10/2005   Appended Document: Orders Update    Clinical Lists Changes  Orders: Added new Test order of T-Hemoccult Card-Multiple (take home) (16109) - Signed Observations: Added new observation of HEMOCCU 3 DT: 03/22/2008 (03/25/2008 10:00) Added new observation of HEMOCCU 2 DT: 03/21/2008 (03/25/2008 10:00) Added new observation of HEMOCCU 1 DT: 03/20/2008 (03/25/2008 10:00) Added new observation of HEMOCCULT 3: negative (03/25/2008 10:00) Added new observation of HEMOCCULT 2: negative (03/25/2008 10:00) Added new observation of HEMOCCULT 1: negative (03/25/2008 10:00) Added new observation of HEMOCCULT: negative x 3 (03/21/2008 17:55)       Laboratory Results  Date/Time Received: Mar 25, 2008 10:45 AM  Date/Time Reported: Oren Beckmann   Mar 25, 2008 10:45 AM   Stool - Occult Blood Hemmoccult #1: negative Date: 03/20/2008 Hemoccult #2: negative Date: 03/21/2008 Hemoccult #3: negative Date: 03/22/2008  Kit Test Internal QC: Positive   (Normal Range: Negative)    Preventive Care Screening  Hemoccult:    Date:  03/21/2008    Results:  negative x 3   Appended Document: RA/ROUTINE CK/VS   Impression & Recommendations:  Problem # 1:  OSTEOPOROSIS (ICD-733.00) Received previous DXA reports. Spine improved from -3.1 (2001) to -2.1 (2006) after starting  tx. Need to repeat to monitor for continued response to tx. Her updated medication list for this problem includes:    Actonel 35 Mg Tabs (Risedronate sodium) .Marland Kitchen... Take 1 tablet by mouth once a day    Caltrate 600+d Tabs (Calcium carbonate-vitamin d tabs) .Marland Kitchen... Take 1 tablet by mouth two times a day  Orders: Dexa scan (Dexa scan)   Complete Medication List: 1)  Actonel 35 Mg Tabs (Risedronate sodium) .... Take 1 tablet by mouth once a day 2)  Detrol La 4 Mg Cp24 (Tolterodine tartrate) .... Take 1 tablet by mouth once a day 3)  Fexofenadine Hcl 180 Mg Tabs (Fexofenadine hcl) .... Take 1 tablet by mouth once a day 4)  Caltrate 600+d Tabs (Calcium carbonate-vitamin d tabs) .Marland KitchenMarland KitchenMarland Kitchen  Take 1 tablet by mouth two times a day 5)  Centrum Tabs (Multiple vitamins-minerals) .... Take 1 tablet by mouth once a day 6)  Vitamin C Tabs (Ascorbic acid tabs) .... Take 1 tablet by mouth once a day 7)  Famotidine 10 Mg Tabs (Famotidine) .... Take 1 tablet by mouth once a day 8)  Warfarin Sodium Tabs (Warfarin sodium tabs) .... Tablet strength: 5mg  take as directed 9)  Joint Support Formula Caps (Glucos-chondroit-collag-hyal) .... 2 caps two times a day 10)  Aleve 220 Mg Tabs (Naproxen sodium) .Marland Kitchen.. 1-2 tabs as needed 11)  Sudafed Pe Maximum Strength 10 Mg Tabs (Phenylephrine hcl) .... 2 tabs qid as needed for sinus symptoms. 12)  Flonase 50 Mcg/act Susp (Fluticasone propionate) .... 2  sprays per nostril once daily 13)  Medium Compression Hose Misc (elastic Bandages & Supports)  .... Apply daily in am as directed.

## 2010-12-08 NOTE — Assessment & Plan Note (Signed)
Summary: 261/ds  Anticoagulant Therapy Managed by: [name]. [name]  PharmD CACP [facility] Attending: Margarito Liner MD Indication 1: Deep vein thrombus Indication 2: Aftercare long term use Anticoagulants V58.61, V58.83 Start date: 03/25/2006 Duration: Indefinite  Patient Assessment Reviewed by: [name] PharmD  December 02, 2008 Medication review: verified warfarin dosage & schedule,verified previous prescription medications, verified doses & any changes, verified new medications, reviewed OTC medications, reviewed OTC health products-vitamins supplements etc Medications after this update: CALTRATE 600+D  TABS (CALCIUM CARBONATE-VITAMIN D TABS) Take 1 tablet by mouth two times a day CENTRUM  TABS (MULTIPLE VITAMINS-MINERALS) Take 1 tablet by mouth once a day VITAMIN C  TABS (ASCORBIC ACID TABS) Take 1 tablet by mouth once a day FAMOTIDINE 10 MG TABS (FAMOTIDINE) Take 1 tablet by mouth once a day JOINT SUPPORT FORMULA   CAPS (GLUCOS-CHONDROIT-COLLAG-HYAL) 2 caps two times a day SUDAFED PE MAXIMUM STRENGTH 10 MG  TABS (PHENYLEPHRINE HCL) 2 tabs qid as needed for sinus symptoms. * MEDIUM COMPRESSION HOSE   MISC (ELASTIC BANDAGES & SUPPORTS) Apply daily in am as directed. WARFARIN SODIUM 5 MG TABS (WARFARIN SODIUM) Tablet Strength: 5mg  Take as directed DITROPAN XL 5 MG XR24H-TAB (OXYBUTYNIN CHLORIDE) Take 1 tablet by mouth two times a day FOSAMAX 70 MG TABS (ALENDRONATE SODIUM) Take 1 tablet by mouth once a week D 1000 PLUS  TABS (FA-CYANOCOBALAMIN-B6-D-CA) Take 1 tablet by mouth once a day HYDROCHLOROTHIAZIDE 25 MG TABS (HYDROCHLOROTHIAZIDE) Take 1 tablet by mouth once a day PRAVASTATIN SODIUM 20 MG TABS (PRAVASTATIN SODIUM) Take 1 tablet by mouth once a day Complications: none Dietary changes: none   Health status changes: none   Lifestyle changes: none   Recent/future hospitalizations: none   Recent/future procedures: none   Recent/future dental: none Comments: Has been started upon HCTZ  and pravastatin. She is switching her pharmacy to [facility]  Patient Assessment Part 2:  Have you MISSED ANY DOSES or CHANGED TABLETS?  No missed Warfarin doses or changed tablets.  Have you had any BRUISING or BLEEDING ( nose or gum bleeds,blood in urine or stool)?  No reported bruising or bleeding in nose, gums, urine, stool.  Have you STARTED or STOPPED any MEDICATIONS, including OTC meds,herbals or supplements?  YES. Has been commenced upon HCTZ and Pravastatin.  Have you CHANGED your DIET, especially green vegetables,or ALCOHOL intake?  No changes in diet or alcohol intake.  Have you had any ILLNESSES or HOSPITALIZATIONS?  No reported illnesses or hospitalizations  Have you had any signs of CLOTTING?(chest discomfort,dizziness,shortness of breath,arms tingling,slurred speech,swelling or redness in leg)    No chest discomfort, dizziness, shortness of breath, tingling in arm, slurred speech, swelling, or redness in leg.     Treatment  Target INR: 2.0-3.0 INR: 1.8  Date: 12/02/2008 Regimen In:  32.5mg /week INR reflects regimen in: 1.8  New  Tablet strength: : 5mg  Regimen Out:     Sunday: 1 Tablet     Monday: 1 Tablet     Tuesday: 1 Tablet     Wednesday: 1 Tablet     Thursday: 1 Tablet      Friday: 1 Tablet     Saturday: 1 Tablet Total Weekly: 35.0mg /week mg  Next INR Due: 12/30/2008 Adjusted by: [name]. [name] PharmD CACP   Return to anticoagulation clinic:  12/30/2008 Time of next visit: 0900     Prescriptions: WARFARIN SODIUM 5 MG TABS (WARFARIN SODIUM) Tablet Strength: 5mg  Take as directed  #31 x 2   Entered  by:   [name] PharmD   Authorized by:   Margarito Liner MD   Signed by:   [name] PharmD on 12/02/2008   Method used:   Electronically to        [facility]       [address]       [place], [place]  [address]       Ph: [phone]       Fax: [phone]   RxID:    [id number]

## 2010-12-08 NOTE — Assessment & Plan Note (Signed)
Summary: PER CHECK OUT/SF   Primary Provider:  Zoila Shutter MD  CC:  check up.  History of Present Illness: 75 yo with history of PE/DVT in 2007, hyperlipidemia, and HTN presented initially for evaluation of exertional dyspnea.  She has had exertional dyspnea 10-15 years.  She is short of breath after walking about 50 feet. She notes this when she walks out in her yards.  She does ok in the house.  She is short of breath with a flight of steps.  No orthopnea/PND.  She gets occasional right-sided sharp chest pain.  This is not exertional.  She never smoked.  She had a DVT and PE in 2007 after several long car trips.  She has been on coumadin since that time.  She has had a cleft palate repair as well as nasal septum surgery.  She uses daily Sudafed because of this.  BP is still high today and has been high at home.   We did a Lexiscan myoview that showed no evidence for ischemia.  Echo showed LV hypertrophy but normal LV systolic function.    Labs (10/11): HDL 63, LDL 111  Current Medications (verified): 1)  Caltrate 600+d  Tabs (Calcium Carbonate-Vitamin D Tabs) .... Take 1 Tablet By Mouth Two Times A Day 2)  Centrum  Tabs (Multiple Vitamins-Minerals) .... Take 1 Tablet By Mouth Once A Day 3)  Vitamin C  Tabs (Ascorbic Acid Tabs) .... Take 1 Tablet By Mouth Once A Day 4)  Famotidine 10 Mg Tabs (Famotidine) .... As Needed 5)  Joint Support Formula   Caps (Glucos-Chondroit-Collag-Hyal) .... 2 Caps Two Times A Day 6)  Sudafed Pe Maximum Strength 10 Mg  Tabs (Phenylephrine Hcl) .... 2 Tabs Qid As Needed For Sinus Symptoms. 7)  Medium Compression Hose   Misc (Elastic Bandages & Supports) .... Apply Daily in Am As Directed. 8)  Warfarin Sodium 5 Mg Tabs (Warfarin Sodium) .... Tablet Strength: 5mg  Take As Directed 9)  Fosamax 70 Mg Tabs (Alendronate Sodium) .... Take 1 Tablet By Mouth Once A Week 10)  D 1000 Plus  Tabs (Fa-Cyanocobalamin-B6-D-Ca) .... Take 1 Tablet By Mouth Once A Day 11)   Hydrochlorothiazide 25 Mg Tabs (Hydrochlorothiazide) .... Take 1 Tablet By Mouth Once A Day 12)  Pravastatin Sodium 20 Mg Tabs (Pravastatin Sodium) .... Take 1 Tablet By Mouth Once A Day 13)  Clobetasol Propionate 0.05 % Crea (Clobetasol Propionate) .... Apply To Affected Area Two Times A Day As Needed 14)  Amlodipine Besylate 10 Mg Tabs (Amlodipine Besylate) .... One A Day 15)  Vesicare 10 Mg Tabs (Solifenacin Succinate) .... One A Day 16)  Metamucil 30.9 % Powd (Psyllium) .Marland Kitchen.. 1 Teaspoon in 8 Oz Water A Day 17)  Aleve 220 Mg Tabs (Naproxen Sodium) .... As Directed 18)  Lisinopril 10 Mg Tabs (Lisinopril) .... One Daily 19)  Dulcolax Stool Softener .Marland Kitchen.. 1 Tab Two Times A Day As Needed  Allergies: 1)  ! Sulfa 2)  Pcn  Past History:  Past Medical History: 1. PE and DVT in 2007 after a long car trip.  She has Factor V Leiden.  She is on long-term coumadin.  2. History of cervical cancer in 1983, recurrence January 2006, status post radiation therapy, partial hysterectomy and colposcopy in 1983, and upper vaginectomy in January 2006. 3. .Osteoporosis. 4.  Chronic sinusitis. 5.  Stress incontinence. 6.  Hernia repair. 7.  Nasal septum repair. 8.  Hand and shoulder repair. 9. Status post colonoscopy in 2001 that  was normal. 10. HTN 11. Cleft palate s/p repair 12. Exertional dyspnea: Lexiscan myoview (11/11) EF 75%, no evidence of ischemia or infarction.  Echo (11/11) with mild LV hypertrophy, EF 55-65%, mild FBSH.  13. Obesity  Family History: Reviewed history from 09/07/2010 and no changes required. Maternal aunt with breast cancer. No known coagulopathic disease in family members. Sister with atrial fibrillation No premature CAD  Social History: Reviewed history from 09/07/2010 and no changes required. 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   Vital Signs:  Patient profile:   75 year old female Height:      65 inches Weight:      211 pounds BMI:     35.24 Pulse rate:   77 / minute Resp:     16 per minute BP sitting:   144 / 71  (left arm)  Vitals Entered By: Kem Parkinson (September 28, 2010 8:53 AM)  Physical Exam  General:  Well developed, well nourished, in no acute distress.  Obese.  Nose:  Septum appears deviated Neck:  Neck supple, no JVD. No masses, thyromegaly or abnormal cervical nodes. Lungs:  Clear bilaterally to auscultation and percussion. Heart:  Non-displaced PMI, chest non-tender; regular rate and rhythm, S1, S2 without murmurs, rubs or gallops. Carotid upstroke normal, no bruit. Pedals normal pulses. Trace ankle edema.  Abdomen:  Bowel sounds positive; abdomen soft and non-tender without masses, organomegaly, or hernias noted. No hepatosplenomegaly. Extremities:  No clubbing or cyanosis. Neurologic:  Alert and oriented x 3. Psych:  Normal affect.   Impression & Recommendations:  Problem # 1:  DYSPNEA ON EXERTION (ICD-786.09) Patient has a long history of exertional dyspnea.  Given risk factors, I did an echo and myoview, which do not explain her symptoms.  I suspect that the shortness of breath, given its long-term presence, may be related to upper airways obstruction/deviated septum.  She takes Sudafed every day to clear congestion.  She has had surgery on her septum in the past but has had no recent ENT evaluation.  Given her symptoms, I will refer her to ENT for aid with management.  I also want her to stop Sudafed given difficult-to-control HTN and will put her on Atrovent nasal spray to see if this helps.    Problem # 2:  PULMONARY EMBOLISM, HX OF (ICD-V12.51) Patient is on coumadin.    Problem # 3:  HYPERTENSION (ICD-401.9) BP is still running high.  Will get BMET today since we recently started lisinopril and will increase lisinopril to 20 mg daily.  BMET/BP check in 2 wks.  Also needs to stop Sudafed.   Other Orders: Misc. Referral (Misc. Ref) TLB-BMP (Basic Metabolic Panel-BMET) (80048-METABOL)  Patient Instructions: 1)  Your physician has recommended you make the following change in your medication:  2)  Increase Lisinopril to 20mg  daily--you can take two 10mg  tablets daily at the same time. 3)  Use Atrovent  Nasal Spray. 4)  Stop sudafed. 5)  Your physician recommends that you have lab today.--BMP 401.1 6)  Return for lab in 2 weeks---BMP  401.1 7)  Take and record your blood pressure---I will call you in  2 weeks to get the readings. Luana Shu  8)  Your physician recommends that you schedule a follow-up appointment as needed with Dr Shirlee Latch.  9)  You have been referred to [facility] and [phone] Prescriptions: ATROVENT 0.03 % SOLN (IPRATROPIUM BROMIDE) two sprays each nostril 2-3 times daily  #1 x 0   Entered by:   Katina Dung, RN, BSN   Authorized by:   Marca Ancona, MD   Signed by:   Katina Dung, RN, BSN on 09/28/2010   Method used:   Electronically to        [id number] (retail)       [address]       [place], [place]  [address]       Ph: [phone]       Fax: [phone]   RxID:   [id number] LISINOPRIL 20 MG TABS (LISINOPRIL) one daily  #30 x 6   Entered by:   Katina Dung, RN, BSN   Authorized by:   Marca Ancona, MD   Signed by:   Katina Dung, RN, BSN on 09/28/2010   Method used:   Electronically to        [id number] ([facility]ail[facility]       [address]       [place], [place]  [address]       Ph: [phone]       Fax: [phone]   RxID:   [id number]

## 2010-12-08 NOTE — Assessment & Plan Note (Signed)
Summary: Coumadin Clinic  Anticoagulant Therapy Managed by: [name]. Janie Morning  PharmD CACP PCP: Zoila Shutter MD [facility] Attending: Darl Pikes, Beth Indication 1: Deep vein thrombus Indication 2: Aftercare long term use Anticoagulants V58.61, V58.83 Start date: 03/25/2006 Duration: Indefinite  Patient Assessment Reviewed by: [name] PharmD  December 15, 2009 Medication review: verified warfarin dosage & schedule,verified previous prescription medications, verified doses & any changes, verified new medications, reviewed OTC medications, reviewed OTC health products-vitamins supplements etc Complications: none Dietary changes: none   Health status changes: none   Lifestyle changes: none   Recent/future hospitalizations: none   Recent/future procedures: none   Recent/future dental: none Patient Assessment Part 2:  Have you MISSED ANY DOSES or CHANGED TABLETS?  No missed Warfarin doses or changed tablets.  Have you had any BRUISING or BLEEDING ( nose or gum bleeds,blood in urine or stool)?  No reported bruising or bleeding in nose, gums, urine, stool.  Have you STARTED or STOPPED any MEDICATIONS, including OTC meds,herbals or supplements?  Has been drinking approximately 16 ounces of Green Tea per day she states. We had a long discussion regarding the impact that Green Tea has upon her INR responsiveness. She is going to cut back to zero consumption. As such, will increase by only 2.5mg /wk from 35mg /wk to 37.5mg /wk--recognizing that she will be drinking LESS/NO Green Tea.  Have you CHANGED your DIET, especially green vegetables,or ALCOHOL intake?  No changes in diet or alcohol intake.  Have you had any ILLNESSES or HOSPITALIZATIONS?  No reported illnesses or hospitalizations  Have you had any signs of CLOTTING?(chest discomfort,dizziness,shortness of breath,arms tingling,slurred speech,swelling or redness in leg)    No chest discomfort, dizziness, shortness of breath, tingling  in arm, slurred speech, swelling, or redness in leg.     Treatment  Target INR: 2.0-3.0 INR: 1.7  Date: 12/15/2009 Regimen In:  35.0mg /week INR reflects regimen in: 1.7  New  Tablet strength: : 5mg  Regimen Out:     Sunday: 1 Tablet     Monday: 1 Tablet     Tuesday: 1 Tablet     Wednesday: 1 & 1/2 Tablet     Thursday: 1 Tablet      Friday: 1 Tablet     Saturday: 1 Tablet Total Weekly: 37.5mg /week mg  Next INR Due: 01/12/2010 Adjusted by: [name]. [name] PharmD CACP   Return to anticoagulation clinic:  01/12/2010 Time of next visit: 0930    Allergies: 1)  ! Sulfa 2)  Pcn  Appended Document: Coumadin Clinic    Anticoagulant Therapy Managed by: [name]. Janie Morning  PharmD CACP PCP: Zoila Shutter MD [facility] Attending: Darl Pikes, Beth Indication 1: Deep vein thrombus Indication 2: Aftercare long term use Anticoagulants V58.61, V58.83 Start date: 03/25/2006 Duration: Indefinite  Patient Assessment Reviewed by: [name] PharmD  December 15, 2009 Medication review: verified warfarin dosage & schedule,verified previous prescription medications, verified doses & any changes, verified new medications, reviewed OTC medications, reviewed OTC health products-vitamins supplements etc Complications: none Dietary changes: none   Health status changes: none   Lifestyle changes: none   Recent/future hospitalizations: none   Recent/future procedures: none   Recent/future dental: none Patient Assessment Part 2:  Have you MISSED ANY DOSES or CHANGED TABLETS?  No missed Warfarin doses or changed tablets.  Have you had any BRUISING or BLEEDING ( nose or gum bleeds,blood in urine or stool)?  No reported bruising or bleeding in nose, gums, urine,  stool.  Have you STARTED or STOPPED any MEDICATIONS, including OTC meds,herbals or supplements?  No other medications or herbal supplements were started or stopped.  Have you CHANGED your DIET, especially green vegetables,or  ALCOHOL intake?  No changes in diet or alcohol intake.  Have you had any ILLNESSES or HOSPITALIZATIONS?  No reported illnesses or hospitalizations  Have you had any signs of CLOTTING?(chest discomfort,dizziness,shortness of breath,arms tingling,slurred speech,swelling or redness in leg)    No chest discomfort, dizziness, shortness of breath, tingling in arm, slurred speech, swelling, or redness in leg.     Treatment  Target INR: 2.0-3.0 INR: 1.7  Date: 12/15/2009 Regimen In:  37.5mg /week INR reflects regimen in: 1.7  New  Tablet strength: : 5mg  Regimen Out:     Sunday: 1 Tablet     Monday: 1 Tablet     Tuesday: 1 Tablet     Wednesday: 1 & 1/2 Tablet     Thursday: 1 Tablet      Friday: 1 Tablet     Saturday: 1 Tablet Total Weekly: 37.5mg /week mg  Next INR Due: 01/12/2010 Adjusted by: [name]. [name] PharmD CACP   Return to anticoagulation clinic:  01/12/2010 Time of next visit: 0930    Allergies: 1)  ! Sulfa 2)  Pcn

## 2010-12-08 NOTE — Progress Notes (Signed)
Summary: Nuclear Pre-Procedure  Phone Note Outgoing Call   Call placed by: Milana Na, EMT-P,  September 16, 2010 1:02 PM Summary of Call: Reviewed information on [employer] Information Sheet (see scanned document for further details).  Spoke with patient.     Nuclear Med Background Indications for Stress Test: Evaluation for Ischemia    History Comments: DVT/PE   Symptoms: Chest Pain, DOE, Fatigue, SOB    Nuclear Pre-Procedure Cardiac Risk Factors: Hypertension, Lipids Height (in): 65  Nuclear Med Study Referring MD:  D.McLean

## 2010-12-08 NOTE — Assessment & Plan Note (Signed)
Summary: [name].  Anticoagulant Therapy Managed by: [name]. Judith [name]  PharmD CACP [facility] Attending: Clydie Braun MD Indication 1: Deep vein thrombus Indication 2: Aftercare long term use Anticoagulants V58.61, V58.83 Start date: 03/25/2006 Duration: Indefinite  Patient Assessment Reviewed by: [name] PharmD  September 09, 2008 Medication review: verified warfarin dosage & schedule,verified previous prescription medications, verified doses & any changes, verified new medications, reviewed OTC medications, reviewed OTC health products-vitamins supplements etc Complications: none Dietary changes: none   Health status changes: none   Lifestyle changes: none   Recent/future hospitalizations: none   Recent/future procedures: none   Recent/future dental: none Patient Assessment Part 2:  Have you MISSED ANY DOSES or CHANGED TABLETS?  No missed Warfarin doses or changed tablets.  Have you had any BRUISING or BLEEDING ( nose or gum bleeds,blood in urine or stool)?  No reported bruising or bleeding in nose, gums, urine, stool.  Have you STARTED or STOPPED any MEDICATIONS, including OTC meds,herbals or supplements?  No other medications or herbal supplements were started or stopped.  Have you CHANGED your DIET, especially green vegetables,or ALCOHOL intake?  No changes in diet or alcohol intake.  Have you had any ILLNESSES or HOSPITALIZATIONS?  No reported illnesses or hospitalizations  Have you had any signs of CLOTTING?(chest discomfort,dizziness,shortness of breath,arms tingling,slurred speech,swelling or redness in leg)    No chest discomfort, dizziness, shortness of breath, tingling in arm, slurred speech, swelling, or redness in leg.     Treatment  Target INR: 2.0-3.0 INR: 2.0  Date: 09/09/2008 Regimen In:  27.5mg /week INR reflects regimen in: 2.0  New  Tablet strength: : 5mg  Regimen Out:     Sunday: 1 Tablet     Monday: 1 Tablet     Tuesday: 1 Tablet  Wednesday: 1/2 Tablet     Thursday: 1 Tablet      Friday: 1 Tablet     Saturday: 1 Tablet Total Weekly: 32.5mg/week mg  Next INR Due: 10/07/2008 Adjusted by: James B. Groce III PharmD CACP   Return to anticoagulation clinic:  10/07/2008 Time of next visit: 0915     Prescriptions: WARFARIN SODIUM 5 MG TABS (WARFARIN SODIUM) Tablet Strength: 5mg Take as directed  #31 x 2   Entered by:   Jay Groce PharmD   Authorized by:   David Fitzgerald MD   Signed by:   Jay Groce PharmD on 09/09/2008   Method used:   Electronically to        [facility]  [address] [id number] ([facility]ail)       [address]       [place], [place]  [address]       Ph: [phone] or [phone]       Fax: [phone]   RxID:   [id number]

## 2010-12-08 NOTE — Assessment & Plan Note (Addendum)
Summary: CHECKUP/SB.   Vital Signs:  Patient profile:   75 year old female Height:      65 inches (165.10 cm) Weight:      209.9 pounds (95.41 kg) BMI:     35.06 Temp:     97.1 degrees F (36.17 degrees C) oral Pulse rate:   74 / minute BP sitting:   126 / 64  (right arm) Cuff size:   large  Vitals Entered By: Cynda Familia Duncan Dull) (August 18, 2010 8:29 AM)  Primary Care Rilyn Scroggs:  Zoila Shutter MD   History of Present Illness: 75 year old who comes in for follow up of several issues. She is anxious about her health so this is always a long visit.  Amlodipine 10mg  a day was begun last time as her BP was not well controlled. Today it is much better.   She was having dyspnea on exertion so I sent her out and asked her to get some exercise. She has been unable to do this for various reasons. Her EKG was normal the last time she was in. However she is still having DOE and I have discussed with her that she does have multiple risk factors for CAD.  Last time she was in she had a lipid panel drawn but she was not fasting so the TG was not accurate. As well I drew an LDL, but today will recheck the lipid panel as she is fasting.  She is still constipated and I have talked to her again today about adding mirilax and possibly stopping metamucil after that.  She does c/o some dizziness, especially after standing up quickly. As well she occassionally feels a sensation of spinning whe laying down and turning. This goes away when she is still. The vertigo is yet another symptom that occurred after changing her incontinence meds.  She says that Dr. Alexandria Lodge has requested that we draw a PT/INR today.  Current Medications (verified): 1)  Caltrate 600+d  Tabs (Calcium Carbonate-Vitamin D Tabs) .... Take 1 Tablet By Mouth Two Times A Day 2)  Centrum  Tabs (Multiple Vitamins-Minerals) .... Take 1 Tablet By Mouth Once A Day 3)  Vitamin C  Tabs (Ascorbic Acid Tabs) .... Take 1 Tablet By Mouth Once  A Day 4)  Famotidine 10 Mg Tabs (Famotidine) .... Take 1 Tablet By Mouth Once A Day 5)  Joint Support Formula   Caps (Glucos-Chondroit-Collag-Hyal) .... 2 Caps Two Times A Day 6)  Sudafed Pe Maximum Strength 10 Mg  Tabs (Phenylephrine Hcl) .... 2 Tabs Qid As Needed For Sinus Symptoms. 7)  Medium Compression Hose   Misc (Elastic Bandages & Supports) .... Apply Daily in Am As Directed. 8)  Warfarin Sodium 5 Mg Tabs (Warfarin Sodium) .... Tablet Strength: 5mg  Take As Directed 9)  Fosamax 70 Mg Tabs (Alendronate Sodium) .... Take 1 Tablet By Mouth Once A Week 10)  D 1000 Plus  Tabs (Fa-Cyanocobalamin-B6-D-Ca) .... Take 1 Tablet By Mouth Once A Day 11)  Hydrochlorothiazide 25 Mg Tabs (Hydrochlorothiazide) .... Take 1 Tablet By Mouth Once A Day 12)  Pravastatin Sodium 20 Mg Tabs (Pravastatin Sodium) .... Take 1 Tablet By Mouth Once A Day 13)  Clobetasol Propionate 0.05 % Crea (Clobetasol Propionate) .... Apply To Affected Area Two Times A Day 14)  Amlodipine Besylate 10 Mg Tabs (Amlodipine Besylate) .... One A Day 15)  Vesicare 10 Mg Tabs (Solifenacin Succinate) .... One A Day 16)  Metamucil 30.9 % Powd (Psyllium) .Marland Kitchen.. 1 Teaspoon in 8  Oz Water A Day  Allergies: 1)  ! Sulfa 2)  Pcn  Past History:  Past Medical History: Reviewed history from 02/28/2008 and no changes required.  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  2.  Pulmonary embolus.  3.  History of cervical cancer in 1983, recurrence January 2006, status post      radiation therapy, partial hysterectomy and colposcopy in 1983, and      upper vaginectomy in January 2006.  4.  Osteoporosis.  5.  Chronic sinusitis.  6.  Stress incontinence.  7.  Hernia repair.  8.  Nasal septum repair.  9.  Hand and shoulder repair.  10. Status post colonoscopy in 2001 that was normal.  11. Hypertension while in the hospital.  Past Surgical History: Reviewed history from 02/12/2008 and no  changes required. Cleft lip repair  [facility], [place], [place] at 6 wks of age) -  1942, @ age 77, repeat Tubal ligation-1976 Adenocarcinoma of Endocervix  -  Radium implants= Dec 1983, Dr. Derrell Lolling and Dr. Addison Bailey) -  Radiation Tx x 15 at [facility] 1984 -  Removal of Cervix and repair of vagina- 2006 Arthroscopy of Left knee for torn cartilage- 1986 Hammertoe repair (3 toes each foot)- 1990-91  Family History: Reviewed history from 02/28/2008 and no changes required. Maternal aunt with breast cancer. No known coagulopathic disease in family members.  Social History: Reviewed history from 07/02/2008 and no changes required. Patient is married (2nd marriage) 2 sons from 1st marriage, current husband has 2 sons from previous marriage Pt's son was schizophrenic, committed suicide 55 Other son lives outside the state, doing well Pt's husband smokes Pt & husband both retired from [employer] - she was occupational Charity fundraiser  Review of Systems General:  Denies fatigue and weakness. CV:  Complains of chest pain or discomfort and shortness of breath with exertion; she complains of rare right sided dull chest pain, not associated with exertion. Resp:  Complains of shortness of breath; denies cough. GI:  Complains of constipation; denies nausea and vomiting. Psych:  Complains of anxiety; surrounding her health.  Physical Exam  General:  alert and well-developed.  reparied cleft lip Eyes:  vision grossly intact.   Neck:  supple and no masses.   Lungs:  normal respiratory effort and normal breath sounds.   Heart:  normal rate, regular rhythm, and no murmur.   Abdomen:  soft, non-tender, and normal bowel sounds.   Extremities:  no edema   Impression & Recommendations:  Problem # 1:  HYPERTENSION (ICD-401.9) This is now well controlled with the addition of amlodipine. Her updated medication list for this problem includes:    Hydrochlorothiazide 25 Mg Tabs  (Hydrochlorothiazide) .Marland Kitchen... Take 1 tablet by mouth once a day    Amlodipine Besylate 10 Mg Tabs (Amlodipine besylate) ..... One a day  Problem # 2:  DYSPNEA ON EXERTION (ICD-786.09) i have discussed this at length with Mrs. Lingo. I think it is more likely not to be cardiac in origin, but as she is unable to exercise to see if this improves, I believe that she needs referred to cardiology for probable non-stress stress test. Her updated medication list for this problem includes:    Hydrochlorothiazide 25 Mg Tabs (Hydrochlorothiazide) .Marland Kitchen... Take 1 tablet by mouth once a day  Orders: Cardiology Referral (Cardiology)  Problem # 3:  HYPERLIPIDEMIA (ICD-272.4) Fasting lipid panel checked today. Liver enzymes were  normal when checked last visit, on statin. Her updated medication list for this problem includes:    Pravastatin Sodium 20 Mg Tabs (Pravastatin sodium) .Marland Kitchen... Take 1 tablet by mouth once a day  Orders: T-Lipid Profile ([id number])  Problem # 4:  DIZZINESS (ICD-780.4) i think this is secondary to BP meds and have asked her to sit or stand up a bit more slowly.  Problem # 5:  CONSTIPATION (ICD-564.00) Again as discussed in HPI, I believe that mirilax would be much more effective for her. Her updated medication list for this problem includes:    Metamucil 30.9 % Powd (Psyllium) .Marland Kitchen... 1 teaspoon in 8 oz water a day  Problem # 6:  FACTOR V DEFICIENCY (ICD-286.3) PT/INR drawn today. Will forward result to Dr. Alexandria Lodge. Orders: T-Protime (in-house) [id number])  Problem # 7:  STRESS INCONTINENCE (ICD-788.39) Patient still would rather be on her former medication for this as it was well tolerated and did not cause constipation, vertigo, fatique. She plans to speak t her gyn that she has known for many years in 1/12. She will let me know if I can help her with this.  Complete Medication List: 1)  Caltrate 600+d Tabs (Calcium carbonate-vitamin d tabs) .... Take 1 tablet by mouth two times a  day 2)  Centrum Tabs (Multiple vitamins-minerals) .... Take 1 tablet by mouth once a day 3)  Vitamin C Tabs (Ascorbic acid tabs) .... Take 1 tablet by mouth once a day 4)  Famotidine 10 Mg Tabs (Famotidine) .... Take 1 tablet by mouth once a day 5)  Joint Support Formula Caps (Glucos-chondroit-collag-hyal) .... 2 caps two times a day 6)  Sudafed Pe Maximum Strength 10 Mg Tabs (Phenylephrine hcl) .... 2 tabs qid as needed for sinus symptoms. 7)  Medium Compression Hose Misc (elastic Bandages & Supports)  .... Apply daily in am as directed. 8)  Warfarin Sodium 5 Mg Tabs (Warfarin sodium) .... Tablet strength: 5mg  take as directed 9)  Fosamax 70 Mg Tabs (Alendronate sodium) .... Take 1 tablet by mouth once a week 10)  D 1000 Plus Tabs (Fa-cyanocobalamin-b6-d-ca) .... Take 1 tablet by mouth once a day 11)  Hydrochlorothiazide 25 Mg Tabs (Hydrochlorothiazide) .... Take 1 tablet by mouth once a day 12)  Pravastatin Sodium 20 Mg Tabs (Pravastatin sodium) .... Take 1 tablet by mouth once a day 13)  Clobetasol Propionate 0.05 % Crea (Clobetasol propionate) .... Apply to affected area two times a day 14)  Amlodipine Besylate 10 Mg Tabs (Amlodipine besylate) .... One a day 15)  Vesicare 10 Mg Tabs (Solifenacin succinate) .... One a day 16)  Metamucil 30.9 % Powd (Psyllium) .Marland Kitchen.. 1 teaspoon in 8 oz water a day  Patient Instructions: 1)  Please schedule a follow-up appointment in 3 months. 2)  We are checking your lipids today and will let you know when they return. 3)  We are checking your PT/INR at Dr. Saralyn Pilar request and will send it to him when it returns. 4)  I think the mirilax will help with the constipation. 5)  Your blood pressure is great!  Laboratory Results   Blood Tests   Date/Time Received: August 18, 2010 9:58 AM  Date/Time Reported: Burke Keels  August 18, 2010 9:58 AM    INR: 2.9   (Normal Range: 0.88-1.12   Therap INR: 2.0-3.5)     Process Orders Check Orders  Results:     [facility]: Check successful Order queued for requisitioning for [facility]: August 18, 2010  10:01 AM  Tests Sent for requisitioning (August 18, 2010 1:13 PM):     08/18/2010: [facility] -- T-Lipid Profile [phone] (signed)    Appended Document: CHECKUP/SB. I have reviewed her INR results and will call her to give new regimen (decrease to 30mg /wk from 32.5mg /wk). Take 1/2 x 5mg  (2.5mg ) on Wed/Fri of each week; 1x5mg  all other days; RTC on Monday 31-Oct-11 at 1000h.

## 2010-12-08 NOTE — Consult Note (Signed)
Summary: [facility]   [facility]   Imported By: Florinda Marker 02/11/2009 15:09:05  _____________________________________________________________________  External Attachment:    Type:   Image     Comment:   External Document

## 2010-12-08 NOTE — Consult Note (Signed)
Summary: [facility] Dr. Felix Pacini [facility] Dr. Elana Alm   Imported By: Florinda Marker 03/27/2008 10:27:42  _____________________________________________________________________  External Attachment:    Type:   Image     Comment:   External Document

## 2010-12-08 NOTE — Assessment & Plan Note (Signed)
Summary: COU/VS  Anticoagulant Therapy Managed by: [name]. [name]  PharmD CACP [facility] Attending: Eliseo Gum MD Indication 1: Deep vein thrombus Indication 2: Aftercare long term use Anticoagulants V58.61, V58.83 Start date: 03/25/2006 Duration: Indefinite  Patient Assessment Reviewed by: [name] PharmD  July 17, 2007 Medication review: verified warfarin dosage & schedule,verified previous prescription medications, verified doses & any changes, verified new medications, reviewed OTC medications, reviewed OTC health products-vitamins supplements etc Complications: none Dietary changes: none   Health status changes: none   Lifestyle changes: none   Recent/future hospitalizations: none   Recent/future procedures: none   Recent/future dental: none Patient Assessment Part 2:  Have you MISSED ANY DOSES or CHANGED TABLETS?  No missed Warfarin doses or changed tablets.  Have you had any BRUISING or BLEEDING ( nose or gum bleeds,blood in urine or stool)?  No reported bruising or bleeding in nose, gums, urine, stool.  Have you STARTED or STOPPED any MEDICATIONS, including OTC meds,herbals or supplements?  No other medications or herbal supplements were started or stopped.  Have you CHANGED your DIET, especially green vegetables,or ALCOHOL intake?  No changes in diet or alcohol intake.  Have you had any ILLNESSES or HOSPITALIZATIONS?  No reported illnesses or hospitalizations  Have you had any signs of CLOTTING?(chest discomfort,dizziness,shortness of breath,arms tingling,slurred speech,swelling or redness in leg)    No chest discomfort, dizziness, shortness of breath, tingling in arm, slurred speech, swelling, or redness in leg.     Treatment  Target INR: 2.0-3.0 INR: 2.0  Date: 07/17/2007 Regimen In:  30.0mg /week INR reflects regimen in: 2.0  New  Tablet strength: : 5mg  Regimen Out:     Sunday: 1 Tablet     Monday: 1 Tablet     Tuesday: 1 Tablet     Wednesday:  1 Tablet     Thursday: 1 Tablet      Friday: 1 Tablet     Saturday: 1 Tablet Total Weekly: 35.0mg /week mg  Next INR Due: 08/21/2007 Adjusted by: [name]. [name] PharmD CACP   Return to anticoagulation clinic:  08/21/2007 Time of next visit: 0945

## 2010-12-08 NOTE — Assessment & Plan Note (Signed)
Summary: COU/SB.  Anticoagulant Therapy Managed by: [name]. Judith [name]  PharmD CACP PCP: Zoila Shutter MD [facility] AttendingRogelia Boga MD, Lanora Manis Indication 1: Deep vein thrombus Indication 2: Aftercare long term use Anticoagulants V58.61, V58.83 Start date: 03/25/2006 Duration: Indefinite  Patient Assessment Reviewed by: [name] PharmD  September 07, 2010 Medication review: verified warfarin dosage & schedule,verified previous prescription medications, verified doses & any changes, verified new medications, reviewed OTC medications, reviewed OTC health products-vitamins supplements etc Complications: none Dietary changes: none   Health status changes: none   Lifestyle changes: none   Recent/future hospitalizations: none   Recent/future procedures: none   Recent/future dental: none Patient Assessment Part 2:  Have you MISSED ANY DOSES or CHANGED TABLETS?  No missed Warfarin doses or changed tablets.  Have you had any BRUISING or BLEEDING ( nose or gum bleeds,blood in urine or stool)?  No reported bruising or bleeding in nose, gums, urine, stool.  Have you STARTED or STOPPED any MEDICATIONS, including OTC meds,herbals or supplements?  No other medications or herbal supplements were started or stopped.  Have you CHANGED your DIET, especially green vegetables,or ALCOHOL intake?  No changes in diet or alcohol intake.  Have you had any ILLNESSES or HOSPITALIZATIONS?  No reported illnesses or hospitalizations  Have you had any signs of CLOTTING?(chest discomfort,dizziness,shortness of breath,arms tingling,slurred speech,swelling or redness in leg)    No chest discomfort, dizziness, shortness of breath, tingling in arm, slurred speech, swelling, or redness in leg.     Treatment  Target INR: 2.0-3.0 INR: 2.0  Date: 09/07/2010 Regimen In:  32.5mg /week INR reflects regimen in: 2.0  New  Tablet strength: : 5mg  Regimen Out:     Sunday: 1 Tablet     Monday: 1 Tablet  Tuesday: 1 Tablet     Wednesday: 1 Tablet     Thursday: 1 Tablet      Friday: 1 Tablet     Saturday: 1 Tablet Total Weekly: 35.0mg /week mg  Next INR Due: 10/12/2010 Adjusted by: [name]. [name] PharmD CACP   Return to anticoagulation clinic:  10/12/2010 Time of next visit: 1000    Allergies: 1)  ! Sulfa 2)  Pcn

## 2010-12-08 NOTE — Letter (Signed)
Summary: *Referral Letter [facility])  [facility]  [address]   [place], [place] [address]   Phone: [phone]  Fax: [phone]      01/09/2009   RE: Stepahnie Campo (DOB: 10/19/1936) [address] [place], [place]  [address]   To Whom It May Concern:  Judith [name] is a 75 yo woman who I see on a regular basis in my clinic. She has multiple medical problems and is currently being treated for a leg ulcer. She has been asked to serve jury duty on 02/11/2009 and I am requesting that she be excused. I think it will put stress on her and negatively effect her health if she has to serve. Thank you for considering her dismissal.  Please contact me if you have any further questions or need additional information.  Sincerely,   Ned Grace MD

## 2010-12-08 NOTE — Assessment & Plan Note (Signed)
Summary: [facility] Clinic  Anticoagulant Therapy Managed by: [name]. [name]  PharmD CACP Indication 1: Deep vein thrombus Indication 2: Aftercare long term use Anticoagulants V58.61 Start date: 03/25/2006 Duration: Indefinite  Patient Assessment Reviewed by: [name] PharmD  February 13, 2007 Medication review: verified warfarin dosage & schedule,verified previous prescription medications, verified doses & any changes, verified new medications, reviewed OTC medications, reviewed OTC health products-vitamins supplements etc Complications: none Dietary changes: none   Health status changes: none   Lifestyle changes: none   Recent/future hospitalizations: none   Recent/future procedures: none   Recent/future dental: none         Have you MISSED ANY DOSES or CHANGED TABLETS?  No missed Warfarin doses or changed tablets.  Have you had any BRUISING or BLEEDING ( nose or gum bleeds,blood in urine or stool)?   No reported bruising or bleeding in nose, gums, urine, stool.   Have you STARTED or STOPPED any MEDICATIONS, including OTC meds,herbals or supplements?  No other medications or herbal supplements were started or stopped.   Have you CHANGED your DIET, especially green vegetables,or ALCOHOL intake?   No changes in diet or alcohol intake.  Have you had any ILLNESSES or HOSPITALIZATIONS?    No reported illnesses or hospitalizations   Have you had any signs of CLOTTING?(chest discomfort,dizziness,shortness of breath,arms tingling,slurred speech,swelling or redness in leg)    No chest discomfort, dizziness, shortness of breath, tingling in arm, slurred speech, swelling, or redness in leg.   Target INR: 2.0-3.0 INR: 3.3  Date: 02/13/2007  INR reflects current regimen: 3.3   New  Tablet strength: : 5mg  Regimen Out:     Sunday: 1 Tablet     Monday: 1 & 1/2 Tablet     Tuesday: 1 Tablet     Wednesday: 1 Tablet     Thursday: 1 & 1/2 Tablet      Friday: 1 Tablet     Saturday: 1  Tablet Total Weekly: 40.0mg /week mg  Next INR Due: 03/27/2007 Adjusted by: [name]. [name] PharmD CACP   Return to anticoagulation clinic:  03/27/2007

## 2010-12-08 NOTE — Assessment & Plan Note (Signed)
Summary: COU/APPT 9:30AM/VS  Anticoagulant Therapy Managed by: [name]. Janie Morning  PharmD CACP PCP: Zoila Shutter MD [facility] AttendingRogelia Boga MD, Lanora Manis Indication 1: Deep vein thrombus Indication 2: Aftercare long term use Anticoagulants V58.61, V58.83 Start date: 03/25/2006 Duration: Indefinite  Patient Assessment Reviewed by: [name] PharmD  January 26, 2010 Medication review: verified warfarin dosage & schedule,verified previous prescription medications, verified doses & any changes, verified new medications, reviewed OTC medications, reviewed OTC health products-vitamins supplements etc Complications: none Dietary changes: none   Health status changes: none   Lifestyle changes: none   Recent/future hospitalizations: none   Recent/future procedures: none   Recent/future dental: none Patient Assessment Part 2:  Have you MISSED ANY DOSES or CHANGED TABLETS?  No missed Warfarin doses or changed tablets.  Have you had any BRUISING or BLEEDING ( nose or gum bleeds,blood in urine or stool)?  No reported bruising or bleeding in nose, gums, urine, stool.  Have you STARTED or STOPPED any MEDICATIONS, including OTC meds,herbals or supplements?  No other medications or herbal supplements were started or stopped.  Have you CHANGED your DIET, especially green vegetables,or ALCOHOL intake?  No changes in diet or alcohol intake.  Have you had any ILLNESSES or HOSPITALIZATIONS?  No reported illnesses or hospitalizations  Have you had any signs of CLOTTING?(chest discomfort,dizziness,shortness of breath,arms tingling,slurred speech,swelling or redness in leg)    No chest discomfort, dizziness, shortness of breath, tingling in arm, slurred speech, swelling, or redness in leg.     Treatment  Target INR: 2.0-3.0 INR: 4.2  Date: 01/26/2010 Regimen In:  32.5mg /week INR reflects regimen in: 4.2  New  Tablet strength: : 5mg  Regimen Out:     Sunday: 1 Tablet     Monday: 1  Tablet     Tuesday: 1/2 Tablet     Wednesday: 1 Tablet     Thursday: 1/2 Tablet      Friday: 1 Tablet     Saturday: 1/2 Tablet Total Weekly: 27.5mg /week mg  Next INR Due: 02/16/2010 Adjusted by: [name]. [name] PharmD CACP   Return to anticoagulation clinic:  02/16/2010 Time of next visit: 0915  Hold:  1 Days    Comments: Will OMIT/HOLD x 1 day, decrease by approximately 20% and RTC in 3 weeks Monday 11-Apr-11 at 0915h.  Allergies: 1)  ! Sulfa 2)  Pcn

## 2010-12-08 NOTE — Letter (Signed)
Summary: [facility]  [facility]   Imported By: Marylou Mccoy 10/16/2010 18:18:35  _____________________________________________________________________  External Attachment:    Type:   Image     Comment:   External Document

## 2010-12-08 NOTE — Assessment & Plan Note (Signed)
Summary: np6/ sob on exertion . pt has medicare/ gd   Primary Provider:  Zoila Shutter MD   History of Present Illness: 75 yo with history of PE/DVT in 2007, hyperlipidemia, and HTN presents for evaluation of exertional dyspnea.  She has had exertional dyspnea 10-15 years.  She is short of breath after walking about 50 feet. She notes this when she walks out in her yards.  She does ok in the house.  She is short of breath with a flight of steps.  No orthopnea/PND.  She gets occasional right-sided sharp chest pain.  This is not exertional.  She never smoked.  She had a DVT and PE in 2007 after several long car trips.  She has been on coumadin since that time.  She has had a cleft palate repair as well as nasal septum surgery. There may be a component of upper airways blockage.  She uses daily Sudafed because of this.  BP is 158/80 today and runs systolic in the 140s at home.    ECG: NSR, normal  Labs (10/11): HDL 63, LDL 111  Current Medications (verified): 1)  Caltrate 600+d  Tabs (Calcium Carbonate-Vitamin D Tabs) .... Take 1 Tablet By Mouth Two Times A Day 2)  Centrum  Tabs (Multiple Vitamins-Minerals) .... Take 1 Tablet By Mouth Once A Day 3)  Vitamin C  Tabs (Ascorbic Acid Tabs) .... Take 1 Tablet By Mouth Once A Day 4)  Famotidine 10 Mg Tabs (Famotidine) .... Take 1 Tablet By Mouth Once A Day 5)  Joint Support Formula   Caps (Glucos-Chondroit-Collag-Hyal) .... 2 Caps Two Times A Day 6)  Sudafed Pe Maximum Strength 10 Mg  Tabs (Phenylephrine Hcl) .... 2 Tabs Qid As Needed For Sinus Symptoms. 7)  Medium Compression Hose   Misc (Elastic Bandages & Supports) .... Apply Daily in Am As Directed. 8)  Warfarin Sodium 5 Mg Tabs (Warfarin Sodium) .... Tablet Strength: 5mg  Take As Directed 9)  Fosamax 70 Mg Tabs (Alendronate Sodium) .... Take 1 Tablet By Mouth Once A Week 10)  D 1000 Plus  Tabs (Fa-Cyanocobalamin-B6-D-Ca) .... Take 1 Tablet By Mouth Once A Day 11)  Hydrochlorothiazide 25 Mg Tabs  (Hydrochlorothiazide) .... Take 1 Tablet By Mouth Once A Day 12)  Pravastatin Sodium 20 Mg Tabs (Pravastatin Sodium) .... Take 1 Tablet By Mouth Once A Day 13)  Clobetasol Propionate 0.05 % Crea (Clobetasol Propionate) .... Apply To Affected Area Two Times A Day 14)  Amlodipine Besylate 10 Mg Tabs (Amlodipine Besylate) .... One A Day 15)  Vesicare 10 Mg Tabs (Solifenacin Succinate) .... One A Day 16)  Metamucil 30.9 % Powd (Psyllium) .Marland Kitchen.. 1 Teaspoon in 8 Oz Water A Day 17)  Aleve 220 Mg Tabs (Naproxen Sodium) .... As Directed  Allergies (verified): 1)  ! Sulfa 2)  Pcn  Past History:  Past Medical History: 1. PE and DVT in 2007 after a long car trip.  She has Factor V Leiden.  She is on long-term coumadin.  2. History of cervical cancer in 1983, recurrence January 2006, status post radiation therapy, partial hysterectomy and colposcopy in 1983, and upper vaginectomy in January 2006. 3. .Osteoporosis. 4.  Chronic sinusitis. 5.  Stress incontinence. 6.  Hernia repair. 7.  Nasal septum repair. 8.  Hand and shoulder repair. 9. Status post colonoscopy in 2001 that was normal. 10. HTN 11. Cleft palate s/p repair 12. Exertional dyspnea: Echo (5/07) with EF 60%, mild diastolic dysfunction, RV upper normal in size.  13.  Obesity  Family History: Maternal aunt with breast cancer. No known coagulopathic disease in family members. 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   Vital Signs:  Patient profile:   75 year old female Height:      65 inches Weight:      211 pounds BMI:      35.24 Pulse rate:   81 / minute Resp:     18 per minute BP sitting:   158 / 80  (left arm)  Vitals Entered By: Marrion Coy, CNA (September 07, 2010 11:23 AM)  Physical Exam  General:  Well developed, well nourished, in no acute distress.  Obese.  Head:  normocephalic and atraumatic Nose:  Septum appears deviated Mouth:  s/p cleft palate repair Neck:  Neck supple, no JVD. No masses, thyromegaly or abnormal cervical nodes. Lungs:  Clear bilaterally to auscultation and percussion. Heart:  Non-displaced PMI, chest non-tender; regular rate and rhythm, S1, S2 without murmurs, rubs or gallops. Carotid upstroke normal, no bruit.Pedals normal pulses. Trace ankle edema.  Abdomen:  Bowel sounds positive; abdomen soft and non-tender without masses, organomegaly, or hernias noted. No hepatosplenomegaly. Msk:  Back normal, normal gait. Muscle strength and tone normal. Extremities:  No clubbing or cyanosis. Neurologic:  Alert and oriented x 3. Skin:  Intact without lesions or rashes. Psych:  anxious.     Impression & Recommendations:  Problem # 1:  DYSPNEA ON EXERTION (ICD-786.09) Patient has a long history of exertional dyspnea.  She has coronary risk factors including HTN and hyperlipidemia.  She has never had an ischemic workup.  I will plan on doing an ETT-myoview.  Will also get an echo to assess RV function, pulmonary pressure, and diastolic function.  It may be that her chronic dyspnea is due to a degree of upper airways obstruction from her history of deviated septum.  She has been on Sudafed long-term for this (which will make BP control more difficult).   Problem # 2:  HYPERTENSION (ICD-401.9) BP is running high.  She does not want to try a stronger diuretic (exchanging HCTZ for chlorthalidone) given some incontinence issues.  I will have her start lisinopril 10 mg daily with BMET and BP check in 2 wks.   Other Orders: Nuclear Stress Test (Nuc Stress Test) Echocardiogram  (Echo)  Patient Instructions: 1)  Your physician has recommended you make the following change in your medication:  2)  Start Lisinopril 10mg  daily. 3)  Your physician has requested that you have an echocardiogram.  Echocardiography is a painless test that uses sound waves to create images of your heart. It provides your doctor with information about the size and shape of your heart and how well your heart's chambers and valves are working.  This procedure takes approximately one hour. There are no restrictions for this procedure. 4)  Your physician recommends that you schedule a follow-up appointment in: 2 weeks after you have had the testing done. 5)  Lab in 2 weeks---BMP 786.09 401.9--you can get this at the time of the appointment with Dr Shirlee Latch. 6)  Your physician  has requested that you have an exercise stress myoview.  For further information please visit [email or web address]  Please follow instruction sheet, as given. Prescriptions: LISINOPRIL 10 MG TABS (LISINOPRIL) one daily  #30 x 6   Entered by:   Katina Dung, RN, BSN   Authorized by:   Marca Ancona, MD   Signed by:   Katina Dung, RN, BSN on 09/07/2010   Method used:   Electronically to        [facility]  [address] [address] [id number] ([facility]ail)       [address]       [place], [place]  [address]       Ph: [phone]       Fax: [phone]   RxID:   [id number]

## 2010-12-08 NOTE — Progress Notes (Signed)
Summary: jury duty/ hla  Phone Note Call from Patient   Summary of Call: pt would like to have a letter excusing her from jury duty. jury duty will be 4/6 Initial call taken by: Marin Roberts RN,  January 06, 2009 2:44 PM  Follow-up for Phone Call        [facility] and [facility] her [facility]. Follow-up by: Ned Grace MD,  January 09, 2009 12:12 PM

## 2010-12-08 NOTE — Assessment & Plan Note (Signed)
Summary: COU/CH  Anticoagulant Therapy Managed by: [name]. Janie Morning  PharmD CACP Referring MD: Marca Ancona, MD PCP: Zoila Shutter MD [facility] Attending: [name], Levada Schilling Indication 1: Deep vein thrombus Indication 2: Aftercare long term use Anticoagulants V58.61, V58.83 Start date: 03/25/2006 Duration: Indefinite  Patient Assessment Reviewed by: [name] PharmD  October 13, 2010 Medication review: verified warfarin dosage & schedule,verified previous prescription medications, verified doses & any changes, verified new medications, reviewed OTC medications, reviewed OTC health products-vitamins supplements etc Complications: none Dietary changes: none   Health status changes: none   Lifestyle changes: none   Recent/future hospitalizations: none   Recent/future procedures: none   Recent/future dental: none Patient Assessment Part 2:  Have you MISSED ANY DOSES or CHANGED TABLETS?  No missed Warfarin doses or changed tablets.  Have you had any BRUISING or BLEEDING ( nose or gum bleeds,blood in urine or stool)?  No reported bruising or bleeding in nose, gums, urine, stool.  Have you STARTED or STOPPED any MEDICATIONS, including OTC meds,herbals or supplements?  No other medications or herbal supplements were started or stopped.  Have you CHANGED your DIET, especially green vegetables,or ALCOHOL intake?  No changes in diet or alcohol intake.  Have you had any ILLNESSES or HOSPITALIZATIONS?  No reported illnesses or hospitalizations  Have you had any signs of CLOTTING?(chest discomfort,dizziness,shortness of breath,arms tingling,slurred speech,swelling or redness in leg)    No chest discomfort, dizziness, shortness of breath, tingling in arm, slurred speech, swelling, or redness in leg.     Treatment  Target INR: 2.0-3.0 INR: 2.5  Date: 10/13/2010 Regimen In:  35.0mg /week INR reflects regimen in: 2.5  New  Tablet strength: : 5mg  Regimen Out:     Sunday: 1  Tablet     Monday: 1 Tablet     Tuesday: 1 Tablet     Wednesday: 1 Tablet     Thursday: 1 Tablet      Friday: 1 Tablet     Saturday: 1 Tablet Total Weekly: 35.0mg /week mg  Next INR Due: 11/09/2010 Adjusted by: [name]. [name] PharmD CACP   Return to anticoagulation clinic:  11/09/2010 Time of next visit: 0930    Allergies: 1)  ! Sulfa 2)  Pcn

## 2010-12-08 NOTE — Assessment & Plan Note (Signed)
Summary: COU/VS  Anticoagulant Therapy Managed by: [name]. Judith [name]  PharmD CACP [facility] Attending: Ned Grace MD Indication 1: Deep vein thrombus Indication 2: Aftercare long term use Anticoagulants V58.61, V58.83 Start date: 03/25/2006 Duration: Indefinite  Patient Assessment Reviewed by: [name] PharmD  July 01, 2008 Medication review: verified warfarin dosage & schedule,verified previous prescription medications, verified doses & any changes, verified new medications, reviewed OTC medications, reviewed OTC health products-vitamins supplements etc Complications: none Dietary changes: none   Health status changes: none   Lifestyle changes: none   Recent/future hospitalizations: none   Recent/future procedures: none   Recent/future dental: none Patient Assessment Part 2:  Have you MISSED ANY DOSES or CHANGED TABLETS?  No missed Warfarin doses or changed tablets.  Have you had any BRUISING or BLEEDING ( nose or gum bleeds,blood in urine or stool)?  No reported bruising or bleeding in nose, gums, urine, stool.  Have you STARTED or STOPPED any MEDICATIONS, including OTC meds,herbals or supplements?  No other medications or herbal supplements were started or stopped.  Have you CHANGED your DIET, especially green vegetables,or ALCOHOL intake?  No changes in diet or alcohol intake.  Have you had any ILLNESSES or HOSPITALIZATIONS?  No reported illnesses or hospitalizations  Have you had any signs of CLOTTING?(chest discomfort,dizziness,shortness of breath,arms tingling,slurred speech,swelling or redness in leg)    No chest discomfort, dizziness, shortness of breath, tingling in arm, slurred speech, swelling, or redness in leg.     Treatment  Target INR: 2.0-3.0 INR: 2.1  Date: 07/01/2008 Regimen In:  30.0mg /week INR reflects regimen in: 2.1  New  Tablet strength: : 5mg  Regimen Out:     Sunday: 1 Tablet     Monday: 1/2 Tablet     Tuesday: 1 Tablet     Wednesday: 1  Tablet     Thursday: 1/2 Tablet      Friday: 1 Tablet     Saturday: 1 Tablet Total Weekly: 30.0mg /week mg  Next INR Due: 07/29/2008 Adjusted by: [name]. [name] PharmD CACP   Return to anticoagulation clinic:  07/29/2008 Time of next visit: 0945

## 2010-12-08 NOTE — Assessment & Plan Note (Signed)
Summary: EST-ROUTINE CHECKUP/CH   Vital Signs:  Patient profile:   75 year old female Height:      65 inches (165.10 cm) Weight:      212.2 pounds (96.45 kg) BMI:     35.44 Temp:     97.8 degrees F (36.56 degrees C) Pulse rate:   75 / minute BP sitting:   156 / 76  (right arm)  Vitals Entered By: Filomena Jungling NT II (June 19, 2009 11:04 AM) Is Patient Diabetic? No Pain Assessment Patient in pain? no      Nutritional Status BMI of > 30 = obese  Have you ever been in a relationship where you felt threatened, hurt or afraid?No   Does patient need assistance? Functional Status Self care Ambulation Normal   History of Present Illness: Judith [name] is a 75 yo woman who is in today for follow up of her multiple medical problems.  1. HTN  - Just started  Norvasc. Taking as directed. Also taking HCTZ.  2. PE/B.DVT's with Factor V Leiden - On Coumadin, followed by  Dr Alexandria Lodge.  3. BPV - Much improved.  4. Stasis dermatitis - Much better. Wears compression stockings intermittently.  5. Incontinence - Seems to be worsening. Takes Ditropan.  6. Fatigue - Feels slightly more fatigued than ususal. "no energy" 7. Hyperlipidemia - Taking statin without side effects.        Preventive Screening-Counseling & Management  Alcohol-Tobacco     Smoking Status: never  Caffeine-Diet-Exercise     Does Patient Exercise: no  Current Medications (verified): 1)  Caltrate 600+d  Tabs (Calcium Carbonate-Vitamin D Tabs) .... Take 1 Tablet By Mouth Two Times A Day 2)  Centrum  Tabs (Multiple Vitamins-Minerals) .... Take 1 Tablet By Mouth Once A Day 3)  Vitamin C  Tabs (Ascorbic Acid Tabs) .... Take 1 Tablet By Mouth Once A Day 4)  Famotidine 10 Mg Tabs (Famotidine) .... Take 1 Tablet By Mouth Once A Day 5)  Joint Support Formula   Caps (Glucos-Chondroit-Collag-Hyal) .... 2 Caps Two Times A Day 6)  Sudafed Pe Maximum Strength 10 Mg  Tabs (Phenylephrine Hcl) .... 2 Tabs Qid As Needed For Sinus  Symptoms. 7)  Medium Compression Hose   Misc (Elastic Bandages & Supports) .... Apply Daily in Am As Directed. 8)  Warfarin Sodium 5 Mg Tabs (Warfarin Sodium) .... Tablet Strength: 5mg  Take As Directed 9)  Ditropan Xl 5 Mg Xr24h-Tab (Oxybutynin Chloride) .... Take 1 Tablet By Mouth Two Times A Day 10)  Fosamax 70 Mg Tabs (Alendronate Sodium) .... Take 1 Tablet By Mouth Once A Week 11)  D 1000 Plus  Tabs (Fa-Cyanocobalamin-B6-D-Ca) .... Take 1 Tablet By Mouth Once A Day 12)  Hydrochlorothiazide 25 Mg Tabs (Hydrochlorothiazide) .... Take 1 Tablet By Mouth Once A Day 13)  Pravastatin Sodium 20 Mg Tabs (Pravastatin Sodium) .... Take 1 Tablet By Mouth Once A Day 14)  Clobetasol Propionate 0.05 % Crea (Clobetasol Propionate) .... Apply To Affected Area Two Times A Day 15)  Amlodipine Besylate 5 Mg Tabs (Amlodipine Besylate) .... Take 1 Tablet By Mouth Once A Day  Allergies (verified): 1)  ! Sulfa 2)  Pcn  Review of Systems General:  Denies chills, fever, and weight loss. CV:  Denies chest pain or discomfort and palpitations. Resp:  Denies cough, shortness of breath, and sputum productive. GI:  Denies abdominal pain and change in bowel habits. GU:  Complains of incontinence; denies dysuria. Judith:  Denies joint pain. Derm:  Complains of rash; LE, improving. Neuro:  Denies memory loss, numbness, and weakness. Psych:  Denies anxiety and depression.  Physical Exam  General:  alert and overweight-appearing.   Lungs:  normal respiratory effort and normal breath sounds.   Heart:  normal rate and regular rhythm.   Abdomen:  soft, non-tender, and normal bowel sounds.   Neurologic:  alert & oriented X3, strength normal in all extremities, and gait normal.   Skin:  Slight discoloration over distal LE's but no lesions, much improved from previous exam   Impression & Recommendations:  Problem # 1:  HYPERTENSION (ICD-401.9) BP much better with addition of Norvasc but not at goal. Will increase  Norvasc to 5mg  daily. Labs today to include K and creatinine.  Her updated medication list for this problem includes:    Hydrochlorothiazide 25 Mg Tabs (Hydrochlorothiazide) .Marland Kitchen... Take 1 tablet by mouth once a day    Amlodipine Besylate 5 Mg Tabs (Amlodipine besylate) .Marland Kitchen... Take 1 tablet by mouth once a day  BP today: 156/76 Prior BP: 167/95 (02/25/2009)  Labs Reviewed: K+: 4.7 (02/25/2009) Creat: : 0.88 (02/25/2009)   Chol: 205 (12/24/2008)   HDL: 70 (12/24/2008)   LDL: 98 (12/24/2008)   TG: 183 (12/24/2008)  Problem # 2:  HYPERLIPIDEMIA (ICD-272.4) LDL 98 on current dose of pravastatin. Continue.  Her updated medication list for this problem includes:    Pravastatin Sodium 20 Mg Tabs (Pravastatin sodium) .Marland Kitchen... Take 1 tablet by mouth once a day  Labs Reviewed: SGOT: 18 (12/24/2008)   SGPT: 13 (12/24/2008)   HDL:70 (12/24/2008), 58 (03/04/2008)  LDL:98 (12/24/2008), 126 (16/08/9603)  Chol:205 (12/24/2008), 223 (03/04/2008)  Trig:183 (12/24/2008), 195 (03/04/2008)  Problem # 3:  FATIGUE (ICD-780.79) Check TSH,  B-12. Hgb 13.4 on 1/10. Orders: T-TSH [phone]) T-Vitamin B12 ([id number])  Problem # 4:  STASIS DERMATITIS, CHRONIC (ICD-459.81) Much improved.  Problem # 5:  BENIGN POSITIONAL VERTIGO (ICD-386.11) Resolved.   Problem # 6:  STRESS INCONTINENCE (ICD-788.39) U/A today negative. Suggested urology referral but pt refused.  Orders: T-Urinalysis ([id number])  Problem # 7:  OSTEOPOROSIS (ICD-733.00) On tx. Last DXA 6/09.  Her updated medication list for this problem includes:    Fosamax 70 Mg Tabs (Alendronate sodium) .Marland Kitchen... Take 1 tablet by mouth once a week  Problem # 8:  PULMONARY EMBOLISM, HX OF (ICD-V12.51) Has Factor V Leiden so life long therapy. Folllowed in Coumadin clinic.  Her updated medication list for this problem includes:    Warfarin Sodium 5 Mg Tabs (Warfarin sodium) .Marland Kitchen... Tablet strength: 5mg  take as directed  Problem # 9:  CERVICAL CANCER, HX OF  (ICD-V10.41) s/p sgy & XRT. Followed by Dr Lelon Perla.   Problem # 10:  Preventive Health Care (ICD-V70.0) Paps Dr Lelon Perla, last OV 2/10 Mammo neg 10/09 Colonoscopy Dr [address], stool cards neg 5/09 DXA 6/09 Zoster vax 8/09 Tetanus 4/09  Complete Medication List: 1)  Caltrate 600+d Tabs (Calcium carbonate-vitamin d tabs) .... Take 1 tablet by mouth two times a day 2)  Centrum Tabs (Multiple vitamins-minerals) .... Take 1 tablet by mouth once a day 3)  Vitamin C Tabs (Ascorbic acid tabs) .... Take 1 tablet by mouth once a day 4)  Famotidine 10 Mg Tabs (Famotidine) .... Take 1 tablet by mouth once a day 5)  Joint Support Formula Caps (Glucos-chondroit-collag-hyal) .... 2 caps two times a day 6)  Sudafed Pe Maximum Strength 10 Mg Tabs (Phenylephrine hcl) .... 2 tabs qid as needed for sinus symptoms. 7)  Medium Compression Hose Misc (  elastic Bandages & Supports)  .... Apply daily in am as directed. 8)  Warfarin Sodium 5 Mg Tabs (Warfarin sodium) .... Tablet strength: 5mg  take as directed 9)  Ditropan Xl 5 Mg Xr24h-tab (Oxybutynin chloride) .... Take 1 tablet by mouth two times a day 10)  Fosamax 70 Mg Tabs (Alendronate sodium) .... Take 1 tablet by mouth once a week 11)  D 1000 Plus Tabs (Fa-cyanocobalamin-b6-d-ca) .... Take 1 tablet by mouth once a day 12)  Hydrochlorothiazide 25 Mg Tabs (Hydrochlorothiazide) .... Take 1 tablet by mouth once a day 13)  Pravastatin Sodium 20 Mg Tabs (Pravastatin sodium) .... Take 1 tablet by mouth once a day 14)  Clobetasol Propionate 0.05 % Crea (Clobetasol propionate) .... Apply to affected area two times a day 15)  Amlodipine Besylate 5 Mg Tabs (Amlodipine besylate) .... Take 1 tablet by mouth once a day  Other Orders: T-Basic Metabolic Panel [phone])  Patient Instructions: 1)  Please schedule a follow-up appointment in 3 months. 2)  We will call you with the results of your labs. 3)  I have increased your dose of Amlodipine to 5mg  a day. You have a  new prescription for this at your pharmacy. You can take 2 of the old pills until they run out.  4)  Send back your stool cards as directed.  Prescriptions: AMLODIPINE BESYLATE 5 MG TABS (AMLODIPINE BESYLATE) Take 1 tablet by mouth once a day  #30 x 5   Entered and Authorized by:   Ned Grace MD   Signed by:   Ned Grace MD on 06/19/2009   Method used:   Electronically to        [facility]  [address] [phone]* (retail)       [address]       [place], [place]  [address]       Ph: [phone]       Fax: [phone]   RxID:   [id number]   Appended Document: Orders Update    Clinical Lists Changes  Orders: Added new Test order of T-Hemoccult Card-Multiple (take home) ([id number]) - Signed Observations: Added new observation of LAB RESULTS: Protocol states specimens should be collected on 3 different days.  Patient collected specimen 2 and 3 from same day Oren Beckmann  June 24, 2009 3:21 PM  (06/24/2009 14:32) Added new observation of HEMOCCU 3 DT: 06/22/2009 (06/24/2009 14:32) Added new observation of HEMOCCU 2 DT: 06/22/2009 (06/24/2009 14:32) Added new observation of HEMOCCU 1 DT: 06/21/2009 (06/24/2009 14:32) Added new observation of HEMOCCULT 3: negative (06/24/2009 14:32) Added new observation of HEMOCCULT 2: negative (06/24/2009 14:32) Added new observation of HEMOCCULT 1: negative (06/24/2009 14:32)      Laboratory Results  Date/Time Received: June 24, 2009 3:20 PM  Date/Time Reported: Oren Beckmann  June 24, 2009 3:20 PM   Stool - Occult Blood Hemmoccult #1: negative Date: 06/21/2009 Hemoccult #2: negative Date: 06/22/2009 Hemoccult #3: negative Date: 06/22/2009 Comments: Protocol states specimens should be collected on 3 different days.  Patient collected specimen 2 and 3 from same day Oren Beckmann  June 24, 2009 3:21 PM   Kit Test Internal QC: Positive   (Normal Range: Negative)

## 2010-12-08 NOTE — Assessment & Plan Note (Signed)
Summary: COU/VS  Anticoagulant Therapy Managed by: [name]. Janie Morning  PharmD CACP OPC Attending: Clydie Braun MD Indication 1: Deep vein thrombus Indication 2: Aftercare long term use Anticoagulants V58.61, V58.83 Start date: 03/25/2006 Duration: Indefinite  Patient Assessment Reviewed by: [name] PharmD  May 27, 2008 Medication review: verified warfarin dosage & schedule,verified previous prescription medications, verified doses & any changes, verified new medications, reviewed OTC medications, reviewed OTC health products-vitamins supplements etc Complications: none Dietary changes: none   Health status changes: none   Lifestyle changes: none   Recent/future hospitalizations: none   Recent/future procedures: none   Recent/future dental: none Patient Assessment Part 2:  Have you MISSED ANY DOSES or CHANGED TABLETS?  No missed Warfarin doses or changed tablets.  Have you had any BRUISING or BLEEDING ( nose or gum bleeds,blood in urine or stool)?  No reported bruising or bleeding in nose, gums, urine, stool.  Have you STARTED or STOPPED any MEDICATIONS, including OTC meds,herbals or supplements?  No other medications or herbal supplements were started or stopped.  Have you CHANGED your DIET, especially green vegetables,or ALCOHOL intake?  No changes in diet or alcohol intake.  Have you had any ILLNESSES or HOSPITALIZATIONS?  No reported illnesses or hospitalizations  Have you had any signs of CLOTTING?(chest discomfort,dizziness,shortness of breath,arms tingling,slurred speech,swelling or redness in leg)    No chest discomfort, dizziness, shortness of breath, tingling in arm, slurred speech, swelling, or redness in leg.     Treatment  Target INR: 2.0-3.0 INR: 3.0  Date: 05/27/2008 Regimen In:  32.5mg /week INR reflects regimen in: 3.0  New  Tablet strength: : 5mg  Regimen Out:     Sunday: 1 Tablet     Monday: 0.5 Tablet     Tuesday: 1 Tablet     Wednesday:  1 Tablet     Thursday: 0.5 Tablet      Friday: 1 Tablet     Saturday: 1 Tablet Total Weekly: 30.0mg /week mg  Next INR Due: 06/24/2008 Adjusted by: [name]. [name] PharmD CACP   Return to anticoagulation clinic:  06/24/2008 Time of next visit: 0900

## 2010-12-08 NOTE — Progress Notes (Signed)
Summary: med refill/gp  Phone Note Refill Request Message from:  Fax from Pharmacy on November 18, 2009 10:31 AM  Refills Requested: Medication #1:  PRAVASTATIN SODIUM 20 MG TABS Take 1 tablet by mouth once a day   Last Refilled: 10/15/2009  Method Requested: Electronic Initial call taken by: Chinita Pester RN,  November 18, 2009 10:31 AM    Prescriptions: PRAVASTATIN SODIUM 20 MG TABS (PRAVASTATIN SODIUM) Take 1 tablet by mouth once a day  #30 Tablet x 11   Entered and Authorized by:   Zoila Shutter MD   Signed by:   Zoila Shutter MD on 11/18/2009   Method used:   [facility]        [facility]  [address] [phone][facility] (retail)       [address]       [place], [place]  [address]       Ph: [phone]       Fax: [phone]   RxID:   [id number]

## 2010-12-08 NOTE — Assessment & Plan Note (Signed)
Summary: COU/VS  Anticoagulant Therapy Managed by: [name]. Janie Morning  PharmD CACP OPC Attending: Clydie Braun MD Indication 1: Deep vein thrombus Indication 2: Aftercare long term use Anticoagulants V58.61, V58.83 Start date: 03/25/2006 Duration: Indefinite  Patient Assessment Reviewed by: [name] PharmD  August 21, 2007 Medication review: verified warfarin dosage & schedule,verified previous prescription medications, verified doses & any changes, verified new medications, reviewed OTC medications, reviewed OTC health products-vitamins supplements etc Complications: none Dietary changes: none   Health status changes: none   Lifestyle changes: none   Recent/future hospitalizations: none   Recent/future procedures: none   Recent/future dental: none Patient Assessment Part 2:  Have you MISSED ANY DOSES or CHANGED TABLETS?  No missed Warfarin doses or changed tablets.  Have you had any BRUISING or BLEEDING ( nose or gum bleeds,blood in urine or stool)?  No reported bruising or bleeding in nose, gums, urine, stool.  Have you STARTED or STOPPED any MEDICATIONS, including OTC meds,herbals or supplements?  No other medications or herbal supplements were started or stopped.  Have you CHANGED your DIET, especially green vegetables,or ALCOHOL intake?  No changes in diet or alcohol intake.  Have you had any ILLNESSES or HOSPITALIZATIONS?  No reported illnesses or hospitalizations  Have you had any signs of CLOTTING?(chest discomfort,dizziness,shortness of breath,arms tingling,slurred speech,swelling or redness in leg)    No chest discomfort, dizziness, shortness of breath, tingling in arm, slurred speech, swelling, or redness in leg.     Treatment  Target INR: 2.0-3.0 INR: 2.6  Date: 08/21/2007 Regimen In:  35.0mg /week INR reflects regimen in: 2.6  New  Tablet strength: : 5mg  Regimen Out:     Sunday: 1 Tablet     Monday: 1 Tablet     Tuesday: 1 Tablet     Wednesday:  1 Tablet     Thursday: 1 Tablet      Friday: 1 Tablet     Saturday: 1 Tablet Total Weekly: 35.0mg /week mg  Next INR Due: 09/18/2007 Adjusted by: [name]. [name] PharmD CACP   Return to anticoagulation clinic:  09/18/2007 Time of next visit: 0930

## 2010-12-08 NOTE — Assessment & Plan Note (Signed)
Summary: RECK BP/LABS/EST/VS   Vital Signs:  Patient Profile:   75 Years Old Female Height:     65 inches (165.10 cm) Weight:      206.9 pounds (94.05 kg) BMI:     34.55 Temp:     97.9 degrees F (36.61 degrees C) oral Pulse rate:   76 / minute BP sitting:   140 / 80  (right arm)  Pt. in pain?   no  Vitals Entered By: Filomena Jungling NT II (December 24, 2008 8:50 AM)              Is Patient Diabetic? No Nutritional Status BMI of 25 - 29 = overweight  Have you ever been in a relationship where you felt threatened, hurt or afraid?No   Does patient need assistance? Functional Status Self care Ambulation Normal     Chief Complaint:  FOLLOW-UP VISIT.  History of Present Illness: Judith [name] is a 75 yo woman who is in today for follow up of her HTN and hyperlipidemia. 1. HTN - Started on HCTZ at last visit. Started taking it on 1/13. Taking it daily. 2. Hyperlipidemia - Started on Pravachol at last visit. Taking it daily with no side effects.  3. Stasis dermatitis of L. medial malleolus - Increase in erythema around the area with scaling and crusting. No actual ulcer.    Prior Medications Reviewed Using: List Brought by Patient  Prior Medication List:  CALTRATE 600+D  TABS (CALCIUM CARBONATE-VITAMIN D TABS) Take 1 tablet by mouth two times a day CENTRUM  TABS (MULTIPLE VITAMINS-MINERALS) Take 1 tablet by mouth once a day VITAMIN C  TABS (ASCORBIC ACID TABS) Take 1 tablet by mouth once a day FAMOTIDINE 10 MG TABS (FAMOTIDINE) Take 1 tablet by mouth once a day JOINT SUPPORT FORMULA   CAPS (GLUCOS-CHONDROIT-COLLAG-HYAL) 2 caps two times a day SUDAFED PE MAXIMUM STRENGTH 10 MG  TABS (PHENYLEPHRINE HCL) 2 tabs qid as needed for sinus symptoms. * MEDIUM COMPRESSION HOSE   MISC (ELASTIC BANDAGES & SUPPORTS) Apply daily in am as directed. WARFARIN SODIUM 5 MG TABS (WARFARIN SODIUM) Tablet Strength: 5mg  Take as directed DITROPAN XL 5 MG XR24H-TAB (OXYBUTYNIN CHLORIDE) Take 1 tablet by  mouth two times a day FOSAMAX 70 MG TABS (ALENDRONATE SODIUM) Take 1 tablet by mouth once a week D 1000 PLUS  TABS (FA-CYANOCOBALAMIN-B6-D-CA) Take 1 tablet by mouth once a day HYDROCHLOROTHIAZIDE 25 MG TABS (HYDROCHLOROTHIAZIDE) Take 1 tablet by mouth once a day PRAVASTATIN SODIUM 20 MG TABS (PRAVASTATIN SODIUM) Take 1 tablet by mouth once a day   Current Allergies: ! SULFA PCN    Risk Factors: Tobacco use:  never Alcohol use:  no Exercise:  no Seatbelt use:  100 %  Colonoscopy History:    Date of Last Colonoscopy:  10/11/2000  Mammogram History:    Date of Last Mammogram:  08/15/2008   Review of Systems  General      Denies chills and fever.  CV      Denies leg cramps with exertion, palpitations, swelling of feet, and swelling of hands.  GI      Denies abdominal pain, change in bowel habits, loss of appetite, and nausea.  GU      Denies urinary frequency.  Judith      Denies muscle aches, cramps, and muscle weakness.  Derm      Complains of poor wound healing and rash.   Physical Exam  General:     alert and overweight-appearing.   Msk:  no joint tenderness, no joint swelling, no joint warmth, no redness over joints, and no joint deformities.   Pulses:     Bilateral pedal pulses.  Extremities:     trace left pedal edema and trace right pedal edema.   Skin:     Area over L. medial malleolus with with erythema,  scaling and crusted erosions;  ~ 9 cm in greatest dimension (7 cm at last visit); no ulceration or drainage    Impression & Recommendations:  Problem # 1:  HYPERTENSION (ICD-401.9) Improvement after starting HCTZ. May need second agent. Continue HCTZ alone for now. Check BMET today.  Her updated medication list for this problem includes:    Hydrochlorothiazide 25 Mg Tabs (Hydrochlorothiazide) .Marland Kitchen... Take 1 tablet by mouth once a day  BP today: 140/80 Prior BP: 160/80 (11/19/2008)  Labs Reviewed: Creat: 0.75 (12/02/2008) Chol: 223  (03/04/2008)   HDL: 58 (03/04/2008)   LDL: 126 (03/04/2008)   TG: 195 (03/04/2008)   Problem # 2:  HYPERLIPIDEMIA (ICD-272.4) No difficulty tolerating the Pravastatin. Check lipid panel and liver enzymes today.  Her updated medication list for this problem includes:    Pravastatin Sodium 20 Mg Tabs (Pravastatin sodium) .Marland Kitchen... Take 1 tablet by mouth once a day  Labs Reviewed: Chol: 223 (03/04/2008)   HDL: 58 (03/04/2008)   LDL: 126 (03/04/2008)   TG: 195 (03/04/2008) SGOT: 21 (03/04/2008)   SGPT: 16 (03/04/2008)   Problem # 3:  STASIS DERMATITIS, CHRONIC (ICD-459.81) Erythematous area increased in size since last visit. No longer putting alcohol on area and using dimethacone cream as directed. Pt has already seen vascular surgeon and had ultrasound therapy without success. Will make dermatology referral.   Problem # 4:  HYPERKALEMIA (ICD-276.7) K noted to be 5.6 on last BMET with decrease to 5.3 on recheck. No obvious reason for hyperkalemia. Check today.   Complete Medication List: 1)  Caltrate 600+d Tabs (Calcium carbonate-vitamin d tabs) .... Take 1 tablet by mouth two times a day 2)  Centrum Tabs (Multiple vitamins-minerals) .... Take 1 tablet by mouth once a day 3)  Vitamin C Tabs (Ascorbic acid tabs) .... Take 1 tablet by mouth once a day 4)  Famotidine 10 Mg Tabs (Famotidine) .... Take 1 tablet by mouth once a day 5)  Joint Support Formula Caps (Glucos-chondroit-collag-hyal) .... 2 caps two times a day 6)  Sudafed Pe Maximum Strength 10 Mg Tabs (Phenylephrine hcl) .... 2 tabs qid as needed for sinus symptoms. 7)  Medium Compression Hose Misc (elastic Bandages & Supports)  .... Apply daily in am as directed. 8)  Warfarin Sodium 5 Mg Tabs (Warfarin sodium) .... Tablet strength: 5mg  take as directed 9)  Ditropan Xl 5 Mg Xr24h-tab (Oxybutynin chloride) .... Take 1 tablet by mouth two times a day 10)  Fosamax 70 Mg Tabs (Alendronate sodium) .... Take 1 tablet by mouth once a week 11)  D  1000 Plus Tabs (Fa-cyanocobalamin-b6-d-ca) .... Take 1 tablet by mouth once a day 12)  Hydrochlorothiazide 25 Mg Tabs (Hydrochlorothiazide) .... Take 1 tablet by mouth once a day 13)  Pravastatin Sodium 20 Mg Tabs (Pravastatin sodium) .... Take 1 tablet by mouth once a day  Other Orders: T-Comprehensive Metabolic Panel ([id number]) T-Lipid Profile ([id number]) Dermatology Referral (Derma)   Patient Instructions: 1)  Please schedule a follow-up appointment in 3 months. 2)  We will call you with the results of you lab tests. 3)  We will call you with an appointment with a dermatologist.  4)  Keep taking all of the medicines as you were.

## 2010-12-08 NOTE — Miscellaneous (Signed)
Summary: Coumadin Lab Visit  Clinical Lists Changes  Orders: Added new Test order of T-Comprehensive Metabolic Panel [phone]) - Signed Added new Test order of T-Lipid Profile [phone]) - Signed Added new Test order of T-CBC w/Diff [phone]) - Signed Added new Test order of T-TSH [phone]) - Signed Added new Test order of T-Factor V Mutation, Leiden [phone]) - Signed

## 2010-12-08 NOTE — Assessment & Plan Note (Signed)
Summary: COU/APPT 9:30AM/VS  Anticoagulant Therapy Managed by: [name]. Janie Morning  PharmD CACP PCP: Zoila Shutter MD [facility] AttendingSampson Goon MD, Onalee Hua Indication 1: Deep vein thrombus Indication 2: Aftercare long term use Anticoagulants V58.61, V58.83 Start date: 03/25/2006 Duration: Indefinite  Patient Assessment Reviewed by: [name] PharmD  January 12, 2010 Medication review: verified warfarin dosage & schedule,verified previous prescription medications, verified doses & any changes, verified new medications, reviewed OTC medications, reviewed OTC health products-vitamins supplements etc Complications: none Dietary changes: none   Health status changes: none   Lifestyle changes: none   Recent/future hospitalizations: none   Recent/future procedures: none   Recent/future dental: none Patient Assessment Part 2:  Have you MISSED ANY DOSES or CHANGED TABLETS?  No missed Warfarin doses or changed tablets.  Have you had any BRUISING or BLEEDING ( nose or gum bleeds,blood in urine or stool)?  No reported bruising or bleeding in nose, gums, urine, stool.  Have you STARTED or STOPPED any MEDICATIONS, including OTC meds,herbals or supplements?  YES. She has STOPPED drinking "green-tea"--a known source of vitamin K1 which in its absence--we are seeing the effect of a LESSENED intake of vitamin K1 accounting for today's hypoprothrombinemic response I suspect.  Have you CHANGED your DIET, especially green vegetables,or ALCOHOL intake?  No changes in diet or alcohol intake.  Have you had any ILLNESSES or HOSPITALIZATIONS?  No reported illnesses or hospitalizations  Have you had any signs of CLOTTING?(chest discomfort,dizziness,shortness of breath,arms tingling,slurred speech,swelling or redness in leg)    No chest discomfort, dizziness, shortness of breath, tingling in arm, slurred speech, swelling, or redness in leg.     Treatment  Target INR: 2.0-3.0 INR: 4.4  Date:  01/12/2010 Regimen In:  37.5mg /week INR reflects regimen in: 4.4  New  Tablet strength: : 5mg  Regimen Out:     Sunday: 1 Tablet     Monday: 1 Tablet     Tuesday: 1 Tablet     Wednesday: 1/2 Tablet     Thursday: 1 Tablet      Friday: 1 Tablet     Saturday: 1 Tablet Total Weekly: 32.5mg /week mg  Next INR Due: 01/26/2010 Adjusted by: [name]. [name] PharmD CACP   Return to anticoagulation clinic:  01/26/2010 Time of next visit: 0930  Hold:  1 Days     Allergies: 1)  ! Sulfa 2)  Pcn

## 2010-12-08 NOTE — Assessment & Plan Note (Signed)
Summary: COU/VS  Anticoagulant Therapy Managed by: [name]. Janie Morning  PharmD CACP [facility] Attending: Ned Grace MD Indication 1: Deep vein thrombus Indication 2: Aftercare long term use Anticoagulants V58.61, V58.83 Start date: 03/25/2006 Duration: Indefinite  Patient Assessment Reviewed by: [name] PharmD  April 21, 2009 Medication review: verified warfarin dosage & schedule,verified previous prescription medications, verified doses & any changes, verified new medications, reviewed OTC medications, reviewed OTC health products-vitamins supplements etc Complications: none Dietary changes: none   Health status changes: none   Lifestyle changes: none   Recent/future hospitalizations: none   Recent/future procedures: none   Recent/future dental: none Patient Assessment Part 2:  Have you MISSED ANY DOSES or CHANGED TABLETS?  No missed Warfarin doses or changed tablets.  Have you had any BRUISING or BLEEDING ( nose or gum bleeds,blood in urine or stool)?  No reported bruising or bleeding in nose, gums, urine, stool.  Have you STARTED or STOPPED any MEDICATIONS, including OTC meds,herbals or supplements?  No other medications or herbal supplements were started or stopped.  Have you CHANGED your DIET, especially green vegetables,or ALCOHOL intake?  No changes in diet or alcohol intake.  Have you had any ILLNESSES or HOSPITALIZATIONS?  No reported illnesses or hospitalizations  Have you had any signs of CLOTTING?(chest discomfort,dizziness,shortness of breath,arms tingling,slurred speech,swelling or redness in leg)    No chest discomfort, dizziness, shortness of breath, tingling in arm, slurred speech, swelling, or redness in leg.     Treatment  Target INR: 2.0-3.0 INR: 3.2  Date: 04/21/2009 Regimen In:  30.0mg /week INR reflects regimen in: 3.2  New  Tablet strength: : 5mg  Regimen Out:     Sunday: 0 Tablet     Monday: 1 Tablet     Tuesday: 1 Tablet     Wednesday: 1/2  Tablet     Thursday: 1 Tablet      Friday: 1 Tablet     Saturday: 1 Tablet Total Weekly: 27.5mg /week mg  Next INR Due: 05/19/2009 Adjusted by: [name]. [name] PharmD CACP   Return to anticoagulation clinic:  05/19/2009 Time of next visit: 1045    Allergies: 1)  ! Sulfa 2)  Pcn

## 2010-12-08 NOTE — Assessment & Plan Note (Signed)
Summary: EST-YEARLY CHECK UP   Vital Signs:  Patient Profile:   75 Years Old Female Height:     65 inches Weight:      186.9 pounds BMI:     31.21 Temp:     95.5 degrees F oral Pulse rate:   85 / minute BP sitting:   146 / 85  (right arm)  Pt. in pain?   no  Vitals Entered By: Henderson Cloud (January 16, 2007 8:57 AM)              Is Patient Diabetic? No Nutritional Status overweight  Have you ever been in a relationship where you felt threatened, hurt or afraid?No   Does patient need assistance? Functional Status Self care Ambulation Normal   Chief Complaint:  DVT check-up.  History of Present Illness: This is a 75 year old woman with a past medical history of Bilateral DVT's (acute and chronic) with pulmonary embolus on chronic coumadin therapy for life, who also has a history of cervical cancer followed by Dr Lelon Perla, in remission, and factor V Leiden heterozygous mutation.  I have last seen her 6 months ago when we had checked a lipid panel which was abnormal, but she decided not to pick up the prescription and continue to try to lose weight which she has been successful at (15 pounds weight loss in 6 months).    She has no complaints.   On review of systems, she says that the swelling on her legs is actually much better than it has been in a long time.  She is also still complaining of chronic sinusitis symptoms.  Prior Medications (reviewed today): WARFARIN SODIUM 5 MG TABS (WARFARIN SODIUM) Use as directed by Dr. Alexandria Lodge, [facility] ACTONEL 35 MG TABS (RISEDRONATE SODIUM) Take 1 tablet by mouth once a day DETROL LA 4 MG CP24 (TOLTERODINE TARTRATE) Take 1 tablet by mouth once a day FEXOFENADINE HCL 180 MG TABS (FEXOFENADINE HCL) Take 1 tablet by mouth once a day CALTRATE 600+D  TABS (CALCIUM CARBONATE-VITAMIN D TABS) Take 1 tablet by mouth two times a day CENTRUM  TABS (MULTIPLE VITAMINS-MINERALS) Take 1 tablet by mouth once a day VITAMIN C  TABS (ASCORBIC ACID  TABS) Take 1 tablet by mouth once a day FAMOTIDINE 10 MG TABS (FAMOTIDINE) Take 1 tablet by mouth once a day Current Allergies (reviewed today): ! SULFA PCN    Risk Factors:  Tobacco use:  never    Physical Exam  General:     alert, well-developed, and well-nourished.   Head:     normocephalic and atraumatic.   Eyes:     vision grossly intact, pupils equal, pupils round, and pupils reactive to light.   Nose:     no external erythema and no nasal discharge.  Scar from cleft lip reparation. Mouth:     good dentition.   Lungs:     normal respiratory effort and normal breath sounds.   Heart:     normal rate, regular rhythm, no murmur, no gallop, and no rub.   Abdomen:     soft, non-tender, and normal bowel sounds.   Extremities:     trace left pedal edema and trace right pedal edema.      Impression & Recommendations:  Problem # 1:  PULMONARY EMBOLISM, HX OF (ICD-V12.51) Continue meds as is.  To see Dr Alexandria Lodge this morning. Her updated medication list for this problem includes:    Warfarin Sodium 5 Mg Tabs (Warfarin sodium) ..... Use  as directed by dr. Alexandria Lodge, coumadin clinic   Problem # 2:  HYPERLIPIDEMIA (ICD-272.4) She has never picked up the pravastatin ordered 6 months ago, and she would rather hold off on that for now as she has been losing weight.  Will recheck a fasting lipid panel in 6 months.  Problem # 3:  HYPERTENSION (ICD-401.9) Wants to hold off on meds for now to continue with her weight loss. BP today: 146/85   Problem # 4:  SINUSITIS, CHRONIC (ICD-473.9) Given the chronicity off this and the fact that she had a previous surgery for her cleft lip and nasal septum, I offered to give her an ENT referral, but she declined for now and will reconsider later.  Problem # 5:  OSTEOPOROSIS (ICD-733.00) Continue as is  Her updated medication list for this problem includes:    Actonel 35 Mg Tabs (Risedronate sodium) .Marland Kitchen... Take 1 tablet by mouth once a day     Caltrate 600+d Tabs (Calcium carbonate-vitamin d tabs) .Marland Kitchen... Take 1 tablet by mouth two times a day   Problem # 6:  CERVICAL CANCER, HX OF (ICD-V10.41) Continue follow-up with Dr Lelon Perla.  Up to date with her PAP smeasr   Medications Added to Medication List This Visit: 1)  Famotidine 10 Mg Tabs (Famotidine) .... Take 1 tablet by mouth once a day   Patient Instructions: 1)  Please schedule a follow-up appointment in 6 months.  We will check your cholesterol again at that time. 2)  Bad fats : cholesterol, trans fat, polyunsaturated fats 3)  Good fats : Omega-3 4)  Not so bad fats : Monounsaturated fats 5)  Keep up the good work with your weight loss

## 2010-12-08 NOTE — Assessment & Plan Note (Signed)
Summary: Cardiology Nuclear Testing  Nuclear Med Background Indications for Stress Test: Evaluation for Ischemia    History Comments: No documented CAD  Symptoms: DOE, Fatigue  Symptoms Comments: Patient denies any chest pain.   Nuclear Pre-Procedure Cardiac Risk Factors: Family History - CAD, Hypertension, Lipids, Obesity Caffeine/Decaff Intake: None NPO After: 8:00 PM Lungs: Clear.  O2 Sat 99% on RA. IV 0.9% NS with Angio Cath: 22g     IV Site: R Hand IV Started by: Bonnita Levan, RN Chest Size (in) 40     Cup Size DD     Height (in): 65 Weight (lb): 208 BMI: 34.74  Nuclear Med Study 1 or 2 day study:  1 day     Stress Test Type:  Eugenie Birks Reading MD:  Olga Millers, MD     Referring MD:  Marca Ancona, MD Resting Radionuclide:  Technetium 1m Tetrofosmin     Resting Radionuclide Dose:  11 mCi  Stress Radionuclide:  Technetium 51m Tetrofosmin     Stress Radionuclide Dose:  32.7 mCi   Stress Protocol   Lexiscan: 0.4 mg   Stress Test Technologist:  Rea College, CMA-N     Nuclear Technologist:  Domenic Polite, CNMT  Rest Procedure  Myocardial perfusion imaging was performed at rest 45 minutes following the intravenous administration of Technetium 47m Tetrofosmin.  Stress Procedure  The patient received IV Lexiscan 0.4 mg over 15-seconds.  Technetium 37m Tetrofosmin injected at 30-seconds.  There were no significant changes with infusion.  Quantitative spect images were obtained after a 45 minute delay.  QPS Raw Data Images:  Acquisition technically good; normal left ventricular size. Stress Images:  Normal homogeneous uptake in all areas of the myocardium. Rest Images:  Normal homogeneous uptake in all areas of the myocardium. Subtraction (SDS):  No evidence of ischemia. Transient Ischemic Dilatation:  1.08  (Normal <1.22)  Lung/Heart Ratio:  0.32  (Normal <0.45)  Quantitative Gated Spect Images QGS EDV:  60 ml QGS ESV:  15 ml QGS EF:  75 % QGS cine images:   Normal wall motion.   Overall Impression  Exercise Capacity: Lexiscan with no exercise. BP Response: Normal blood pressure response. Clinical Symptoms: No chest pain ECG Impression: No significant ST segment change suggestive of ischemia. Overall Impression: Normal lexiscan nuclear study with no ischemia or infarction.  Appended Document: Cardiology Nuclear Testing normal study  Appended Document: Cardiology Nuclear Testing [facility]  Appended Document: Cardiology Nuclear Testing Pt aware of results by phone.

## 2010-12-10 NOTE — Assessment & Plan Note (Signed)
Summary: 930AM COU APPT/CH  Anticoagulant Therapy Managed by: Judith [name]. Judith [name]  PharmD CACP Referring MD: Marca Ancona, MD PCP: Zoila Shutter MD [facility] Attending: Reche Dixon MD, Onalee Hua Indication 1: Deep vein thrombus Indication 2: Aftercare long term use Anticoagulants V58.61, V58.83 Start date: 03/25/2006 Duration: Indefinite  Patient Assessment Reviewed by: Judith [name] PharmD  Judith [name]  2, 2012 Medication review: verified warfarin dosage & schedule,verified previous prescription medications, verified doses & any changes, verified new medications, reviewed OTC medications, reviewed OTC health products-vitamins supplements etc Complications: none Dietary changes: none   Health status changes: none   Lifestyle changes: none   Recent/future hospitalizations: none   Recent/future procedures: none   Recent/future dental: none Patient Assessment Part 2:  Have you MISSED ANY DOSES or CHANGED TABLETS?  No missed Warfarin doses or changed tablets.  Have you had any BRUISING or BLEEDING ( nose or gum bleeds,blood in urine or stool)?  No reported bruising or bleeding in nose, gums, urine, stool.  Have you STARTED or STOPPED any MEDICATIONS, including OTC meds,herbals or supplements?  No other medications or herbal supplements were started or stopped.  Have you CHANGED your DIET, especially green vegetables,or ALCOHOL intake?  No changes in diet or alcohol intake.  Have you had any ILLNESSES or HOSPITALIZATIONS?  No reported illnesses or hospitalizations  Have you had any signs of CLOTTING?(chest discomfort,dizziness,shortness of breath,arms tingling,slurred speech,swelling or redness in leg)    No chest discomfort, dizziness, shortness of breath, tingling in arm, slurred speech, swelling, or redness in leg.     Treatment  Target INR: 2.0-3.0 INR: 3.3  Date: 11/09/2010 Regimen In:  35.0mg /week INR reflects regimen in: 3.3  New  Tablet strength: : 5mg  Regimen Out:     Sunday: 1  Tablet     Monday: 1 Tablet     Tuesday: 1 Tablet     Wednesday: 1/2 Tablet     Thursday: 1 Tablet      Friday: 1 Tablet     Saturday: 1 Tablet Total Weekly: 32.5mg /week mg  Next INR Due: 12/07/2010 Adjusted by: Judith [name]. Judith [name] PharmD CACP   Return to anticoagulation clinic:  12/07/2010 Time of next visit: 0915    Allergies: 1)  ! Sulfa 2)  Pcn

## 2010-12-10 NOTE — Assessment & Plan Note (Signed)
Summary: consult for doe   Visit Type:  Initial Consult Copy to:  Suzanna Obey MD Primary Provider/Referring Provider:  Zoila Shutter MD  CC:  Pulmonary Consult. pt c/o sob w/ activity. .  History of Present Illness: The pt is a 74y/o female who I have been asked to see for dyspnea.  She tells me that she has had doe for more than 74yrs, and she thinks that it has been at a stable baseline for approx. 2 years.  She describes doe at less than one block at a moderate pace on flat ground, and will get winded bringing groceries in from the car.  She also will get sob making a bed or vacuuming one room of her home.  She has minimal dry cough, and primarily occurs with deep breathing.  She has no h/o asthma, and has never had pfts.  Her last cxr was a year ago?  She has some LE edema, but it has not been overly significant.  She has a h/o PE/DVT and has Factor V Leiden mutation, but has been on chronic coumadin for years.  She has had a recent echo with no pulmonary htn.  She has also had a nuclear cardiac study that was unrevealing.  She tells me that her weight is stable from 2 years ago, and that she has not had a recent TSH or CBC.    Current Medications (verified): 1)  Caltrate 600+d  Tabs (Calcium Carbonate-Vitamin D Tabs) .... Take 1 Tablet By Mouth Two Times A Day 2)  Centrum  Tabs (Multiple Vitamins-Minerals) .... Take 1 Tablet By Mouth Once A Day 3)  Vitamin C  Tabs (Ascorbic Acid Tabs) .... Take 1 Tablet By Mouth Once A Day 4)  Famotidine 10 Mg Tabs (Famotidine) .... As Needed 5)  Joint Support Formula   Caps (Glucos-Chondroit-Collag-Hyal) .... 2 Caps Two Times A Day 6)  Medium Compression Hose   Misc (Elastic Bandages & Supports) .... Apply Daily in Am As Directed. 7)  Warfarin Sodium 5 Mg Tabs (Warfarin Sodium) .... Tablet Strength: 5mg  Take As Directed 8)  Fosamax 70 Mg Tabs (Alendronate Sodium) .... Take 1 Tablet By Mouth Once A Week 9)  D 1000 Plus  Tabs (Fa-Cyanocobalamin-B6-D-Ca)  .... Take 1 Tablet By Mouth Once A Day 10)  Pravastatin Sodium 20 Mg Tabs (Pravastatin Sodium) .... Take 1 Tablet By Mouth Once A Day 11)  Clobetasol Propionate 0.05 % Crea (Clobetasol Propionate) .... Apply To Affected Area Two Times A Day As Needed 12)  Amlodipine Besylate 2.5 Mg Tabs (Amlodipine Besylate) .... Once Daily 13)  Vesicare 10 Mg Tabs (Solifenacin Succinate) .... One A Day 14)  Metamucil 30.9 % Powd (Psyllium) .Marland Kitchen.. 1 Teaspoon in 8 Oz Water A Day 15)  Aleve 220 Mg Tabs (Naproxen Sodium) .... As Directed 16)  Lisinopril 20 Mg Tabs (Lisinopril) .... One-Half  Daily 17)  Dulcolax Stool Softener .Marland Kitchen.. 1 Tab Two Times A Day As Needed 18)  Atrovent 0.03 % Soln (Ipratropium Bromide) .... Two Sprays Each Nostril 2-3 Times Daily 19)  Hydrochlorothiazide 25 Mg Tabs (Hydrochlorothiazide) .... Once Daily  Allergies (verified): 1)  ! Sulfa 2)  Pcn  Past History:  Past Medical History: Reviewed history from 09/28/2010 and no changes required. 1. PE and DVT in 2007 after a long car trip.  She has Factor V Leiden.  She is on long-term coumadin.  2. History of cervical cancer in 1983, recurrence January 2006, status post radiation therapy, partial hysterectomy and colposcopy  in 1983, and upper vaginectomy in January 2006. 3. .Osteoporosis. 4.  Chronic sinusitis. 5.  Stress incontinence. 6.  Hernia repair. 7.  Nasal septum repair. 8.  Hand and shoulder repair. 9. Status post colonoscopy in 2001 that was normal. 10. HTN 11. Cleft palate s/p repair 12. Exertional dyspnea: Lexiscan myoview (11/11) EF 75%, no evidence of ischemia or infarction.  Echo (11/11) with mild LV hypertrophy, EF 55-65%, mild FBSH.  13. Obesity  Past Surgical History: Cleft lip repair  [facility], [place], [place] at 6 wks of age) -  1942, @ age 46, repeat Tubal ligation-1976 Adenocarcinoma of Endocervix  -  Radium implants= Dec 1983, Dr. Derrell Lolling and Dr. Addison Bailey) -  Radiation Tx x 15 at [facility]  1984 -  Removal of Cervix and repair of vagina- 2006 Arthroscopy of Left knee for torn cartilage- 1986 Hammertoe repair (3 toes each foot)- 1990-91 nasal septum repaired  Family History: Reviewed history from 09/07/2010 and no changes required. Maternal aunt with breast cancer. No known coagulopathic disease in family members. Sister with atrial fibrillation No premature CAD  Social History: Reviewed history from 09/07/2010 and no changes required. 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  The patient denies shortness of breath at rest, coughing up blood, chest pain, irregular heartbeats, acid heartburn, indigestion, loss of appetite, weight change, abdominal pain, difficulty swallowing, sore throat, tooth/dental problems, headaches, sneezing, itching, ear ache, anxiety, depression, rash, change in color of mucus, and fever.    Vital Signs:  Patient profile:   75 year old female Height:      65 inches Weight:      210.13 pounds O2 Sat:      100 % on Room air Temp:     97.6 degrees F oral Pulse rate:   71 / minute BP sitting:   142 / 74  (left arm) Cuff size:   large  Vitals Entered By: Carver Fila (October 16, 2010 2:46 PM)  O2 Flow:  Room air CC: Pulmonary Consult. pt c/o sob w/ activity.  Comments meds and allergies updated Phone number updated Carver Fila  October 16, 2010 2:47 PM    Physical Exam  General:  ow female in nad Eyes:  PERRLA and EOMI.   Nose:  deviated septum to  left with narrowing no discharge or purulence Mouth:  clear, no exudates or other lesions. Neck:  no jvd, tmg, LN Lungs:  totally clear to auscultation no wheezing or rhonchi Heart:  rrr, no mrg Abdomen:  soft and nontender, bs+ Extremities:  mild pedal edema, compression hose in place no cyanosis.  Pulses intact distally  Neurologic:  alert and oriented, moves all 4.   Impression & Recommendations:  Problem # 1:  DYSPNEA ON EXERTION (ICD-786.09) the pt is describing longstanding doe, and does not feel it has worsened over the past 2 years.  She has had a recent cardiac w/u that was unremarkable, and no evidence for pulmonary htn on echo.  She had a normal TSH a  year ago, but has not had a cbc in 2 yrs.  She has not had a recent cxr, and will check one today.  At this point, will schedule for full pfts to evaluate for a possible pulmonary etiology to her sob.  I suspect this will be unrevealing, and therefore her sob is more than likely related to her weight and conditioning.  Will see her back at that time.  Medications Added to Medication List This Visit: 1)  Amlodipine Besylate 2.5 Mg Tabs (Amlodipine besylate) .... Once daily 2)  Hydrochlorothiazide 25 Mg Tabs (Hydrochlorothiazide) .... Once daily  Other Orders: T-2 View CXR ([id number]) Consultation Level IV (16109) Pulmonary Referral (Pulmonary)  Patient Instructions: 1)  will check cxr today, and let you know the results. 2)  will schedule for breathing studies, and see you back the same day to discuss results.

## 2010-12-10 NOTE — Progress Notes (Signed)
Summary: Patient's at Home Vitals   Patient's at Home Vitals   Imported By: Roderic Ovens 10/29/2010 11:38:47  _____________________________________________________________________  External Attachment:    Type:   Image     Comment:   External Document

## 2010-12-10 NOTE — Assessment & Plan Note (Signed)
Summary: EST-3 MONTH CHECKUP/CH   Vital Signs:  Patient profile:   75 year old female Height:      65 inches (165.10 cm) Weight:      215.9 pounds (98.14 kg) BMI:     36.06 Temp:     97.0 degrees F (36.11 degrees C) oral Pulse rate:   79 / minute BP sitting:   116 / 62  (left arm) Cuff size:   large  Vitals Entered By: Cynda Familia Duncan Dull) (November 24, 2010 1:26 PM) CC: recheck Is Patient Diabetic? No Pain Assessment Patient in pain? no      Nutritional Status BMI of > 30 = obese  Have you ever been in a relationship where you felt threatened, hurt or afraid?No   Does patient need assistance? Functional Status Self care Ambulation Normal   Primary Care Provider:  Zoila Shutter MD  CC:  recheck.  History of Present Illness: 75 year old who comes in for follow up of:  1. Shortness of breath. Since I last saw Mrs. Kingsberry she has had a full cardiac and pulmonary evaluation which were all negative. The etiology of her SOB is thought to be weight related and decondtioning. The testing included negative Lexiscan ETT, Echo (EF nl, +LVH), -CXR, normal PFT's. She and I did discuss today the need for her to start exercising.  2. HTN: This is well controlled on current regimen.  3. Hyperlipidemia:  She is on pravastatin with good lipid panel.  4. Hx of PE/DVT in 2007, on chronic anti-coagulation.  Preventive Screening-Counseling & Management  Alcohol-Tobacco     Alcohol drinks/day: once or twice a year     Smoking Status: never  Current Medications (verified): 1)  Caltrate 600+d  Tabs (Calcium Carbonate-Vitamin D Tabs) .... Take 1 Tablet By Mouth Two Times A Day 2)  Centrum  Tabs (Multiple Vitamins-Minerals) .... Take 1 Tablet By Mouth Once A Day 3)  Vitamin C  Tabs (Ascorbic Acid Tabs) .... Take 1 Tablet By Mouth Once A Day 4)  Famotidine 10 Mg Tabs (Famotidine) .... As Needed 5)  Joint Support Formula   Caps (Glucos-Chondroit-Collag-Hyal) .... 2 Caps Two  Times A Day 6)  Medium Compression Hose   Misc (Elastic Bandages & Supports) .... Apply Daily in Am As Directed. 7)  Warfarin Sodium 5 Mg Tabs (Warfarin Sodium) .... Tablet Strength: 5mg  Take As Directed 8)  Fosamax 70 Mg Tabs (Alendronate Sodium) .... Take 1 Tablet By Mouth Once A Week 9)  D 1000 Plus  Tabs (Fa-Cyanocobalamin-B6-D-Ca) .... Take 1 Tablet By Mouth Once A Day 10)  Pravastatin Sodium 20 Mg Tabs (Pravastatin Sodium) .... Take 1 Tablet By Mouth Once A Day 11)  Clobetasol Propionate 0.05 % Crea (Clobetasol Propionate) .... Apply To Affected Area Two Times A Day As Needed 12)  Amlodipine Besylate 2.5 Mg Tabs (Amlodipine Besylate) .... Once Daily 13)  Vesicare 10 Mg Tabs (Solifenacin Succinate) .... One A Day 14)  Metamucil 30.9 % Powd (Psyllium) .Marland Kitchen.. 1 Teaspoon in 8 Oz Water A Day 15)  Aleve 220 Mg Tabs (Naproxen Sodium) .... As Directed 16)  Lisinopril 20 Mg Tabs (Lisinopril) .... One Daily 17)  Dulcolax Stool Softener .Marland Kitchen.. 1 Tab Two Times A Day As Needed 18)  Atrovent 0.03 % Soln (Ipratropium Bromide) .... Two Sprays Each Nostril 2-3 Times Daily 19)  Hydrochlorothiazide 25 Mg Tabs (Hydrochlorothiazide) .... Once Daily  Allergies: 1)  ! Sulfa 2)  Pcn  Past History:  Past Medical  History: Reviewed history from 09/28/2010 and no changes required. 1. PE and DVT in 2007 after a long car trip.  She has Factor V Leiden.  She is on long-term coumadin.  2. History of cervical cancer in 1983, recurrence January 2006, status post radiation therapy, partial hysterectomy and colposcopy in 1983, and upper vaginectomy in January 2006. 3. .Osteoporosis. 4.  Chronic sinusitis. 5.  Stress incontinence. 6.  Hernia repair. 7.  Nasal septum repair. 8.  Hand and shoulder repair. 9. Status post colonoscopy in 2001 that was normal. 10. HTN 11. Cleft palate s/p repair 12. Exertional dyspnea: Lexiscan myoview (11/11) EF 75%, no evidence of ischemia or infarction.  Echo (11/11) with mild LV  hypertrophy, EF 55-65%, mild FBSH.  13. Obesity  Past Surgical History: Reviewed history from 10/16/2010 and no changes required. Cleft lip repair  [facility], [place], [place] at 6 wks of age) -  1942, @ age 60, repeat Tubal ligation-1976 Adenocarcinoma of Endocervix  -  Radium implants= Dec 1983, Dr. Derrell Lolling and Dr. Addison Bailey) -  Radiation Tx x 15 at [facility] 1984 -  Removal of Cervix and repair of vagina- 2006 Arthroscopy of Left knee for torn cartilage- 1986 Hammertoe repair (3 toes each foot)- 1990-91 nasal septum repaired  Family History: Reviewed history from 09/07/2010 and no changes required. Maternal aunt with breast cancer. No known coagulopathic disease in family members. Sister with atrial fibrillation No premature CAD  Social History: Reviewed history from 09/07/2010 and no changes required. 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  in NAD Head:  normocephalic and atraumatic.   Eyes:  vision grossly intact.   Mouth:  good dentition.   Neck:  supple, full ROM, and no masses.   Lungs:  normal respiratory effort and normal breath sounds.   Heart:  normal rate, regular rhythm, and no murmur.   Abdomen:  soft, non-tender, and normal bowel sounds.   Extremities:  1+ left pedal edema and 1+ right pedal edema.     Impression & Recommendations:  Problem # 1:  SHORTNESS OF BREATH (ICD-786.05) After negative cardiac and pulmonary evaluation, etiology is assumed to be weight and deconditioning. We did discuss again beginning exercise.  Problem # 2:  HYPERTENSION (ICD-401.9) This is well controlled on current regimen. Her updated medication list for this  problem includes:    Amlodipine Besylate 2.5 Mg Tabs (Amlodipine besylate) ..... Once daily    Lisinopril 20 Mg Tabs (Lisinopril) ..... One daily    Hydrochlorothiazide 25 Mg Tabs (Hydrochlorothiazide) ..... Once daily  Problem # 3:  HYPERLIPIDEMIA (ICD-272.4) Well controlled on pravastatin, with normal LFT's. Her updated medication list for this problem includes:    Pravastatin Sodium 20 Mg Tabs (Pravastatin sodium) .Marland Kitchen... Take 1 tablet by mouth once a day  Problem # 4:  CONSTIPATION (ICD-564.00) Controlled on current regimen. Her updated medication list for this problem includes:    Metamucil 30.9 % Powd (Psyllium) .Marland Kitchen... 1 teaspoon in 8 oz water a day  Problem # 5:  OSTEOPOROSIS (ICD-733.00) Bone density stable, will continue fosamax. Her updated medication list for this problem includes:    Fosamax 70 Mg Tabs (Alendronate sodium) .Marland Kitchen... Take 1 tablet by  mouth once a week  Complete Medication List: 1)  Caltrate 600+d Tabs (Calcium carbonate-vitamin d tabs) .... Take 1 tablet by mouth two times a day 2)  Centrum Tabs (Multiple vitamins-minerals) .... Take 1 tablet by mouth once a day 3)  Vitamin C Tabs (Ascorbic acid tabs) .... Take 1 tablet by mouth once a day 4)  Famotidine 10 Mg Tabs (Famotidine) .... As needed 5)  Joint Support Formula Caps (Glucos-chondroit-collag-hyal) .... 2 caps two times a day 6)  Medium Compression Hose Misc (elastic Bandages & Supports)  .... Apply daily in am as directed. 7)  Warfarin Sodium 5 Mg Tabs (Warfarin sodium) .... Tablet strength: 5mg  take as directed 8)  Fosamax 70 Mg Tabs (Alendronate sodium) .... Take 1 tablet by mouth once a week 9)  D 1000 Plus Tabs (Fa-cyanocobalamin-b6-d-ca) .... Take 1 tablet by mouth once a day 10)  Pravastatin Sodium 20 Mg Tabs (Pravastatin sodium) .... Take 1 tablet by mouth once a day 11)  Clobetasol Propionate 0.05 % Crea (Clobetasol propionate) .... Apply to affected area two times a day as needed 12)  Amlodipine  Besylate 2.5 Mg Tabs (Amlodipine besylate) .... Once daily 13)  Vesicare 10 Mg Tabs (Solifenacin succinate) .... One a day 14)  Metamucil 30.9 % Powd (Psyllium) .Marland Kitchen.. 1 teaspoon in 8 oz water a day 15)  Aleve 220 Mg Tabs (Naproxen sodium) .... As directed 16)  Lisinopril 20 Mg Tabs (Lisinopril) .... One daily 17)  Dulcolax Stool Softener  .Marland Kitchen.. 1 tab two times a day as needed 18)  Atrovent 0.03 % Soln (Ipratropium bromide) .... Two sprays each nostril 2-3 times daily 19)  Hydrochlorothiazide 25 Mg Tabs (Hydrochlorothiazide) .... Once daily  Patient Instructions: 1)  Please schedule a follow-up appointment in 3 months. 2)  Please come fasting to that visit-nothing to eat after midnight. You may drink water or black coffee or tea before you come in. 3)  I am so glad all of the tests for your heart and lungs are normal. 4)  Good luck with exercising.   Orders Added: 1)  Est. Patient Level IV [37628]     Prevention & Chronic Care Immunizations   Influenza vaccine: Fluvax MCR  (07/07/2010)    Tetanus booster: Not documented    Pneumococcal vaccine: Not documented    H. zoster vaccine: 07/02/2008: Zostavax  Colorectal Screening   Hemoccult: negative x 3  (03/21/2008)   Hemoccult due: 03/21/2009    Colonoscopy: Results: Hemorrhoids.       (10/11/2000)   Colonoscopy due: 10/2005  Other Screening   Pap smear: Not documented    Mammogram: No specific mammographic evidence of malignancy.  Assessment: BIRADS 1. Location: [facility].    (08/15/2008)   Mammogram action/deferral: Screening mammogram in 1 year.     (08/15/2008)   Mammogram due: 08/2009    DXA bone density scan:  Lumbar Spine:  T Score-2.5 to -1.0 Spine (T Score -2.1).   Hip Total: T Score > -1.0 Hip (T Score -0.4).  Location:  [facility].     (05/01/2008)   DXA bone density action/deferral: Ordered  (07/07/2010)   Smoking status: never  (11/24/2010)  Lipids   Total  Cholesterol: 203  (08/18/2010)   Lipid panel action/deferral: LDL Direct Ordered   LDL: 315  (08/18/2010)   LDL Direct: 133  (07/07/2010)   HDL: 63  (08/18/2010)   Triglycerides: 146  (08/18/2010)    SGOT (AST): 22  (07/07/2010)   SGPT (  ALT): 15  (07/07/2010)   Alkaline phosphatase: 83  (07/07/2010)   Total bilirubin: 0.3  (07/07/2010)    Lipid flowsheet reviewed?: Yes   Progress toward LDL goal: Improved  Hypertension   Last Blood Pressure: 116 / 62  (11/24/2010)   Serum creatinine: 0.8  (11/06/2010)   Serum potassium 4.7  (11/06/2010)    Hypertension flowsheet reviewed?: Yes   Progress toward BP goal: At goal  Self-Management Support :    Hypertension self-management support: Education handout  (09/30/2009)    Lipid self-management support: Not documented

## 2010-12-10 NOTE — Letter (Signed)
Summary: Self-Recorded Vitals  Self-Recorded Vitals   Imported By: Marylou Mccoy 11/12/2010 10:23:38  _____________________________________________________________________  External Attachment:    Type:   Image     Comment:   External Document

## 2010-12-10 NOTE — Miscellaneous (Signed)
Summary: Orders Update pft charges  Clinical Lists Changes  Orders: Added new Service order of Carbon Monoxide diffusing w/capacity (94720) - Signed Added new Service order of Lung Volumes (94240) - Signed Added new Service order of Spirometry (Pre & Post) (94060) - Signed 

## 2010-12-10 NOTE — Progress Notes (Signed)
  Walk in Patient Form Recieved " Pt left BP readings" sent to Message Nurse [facility]  October 22, 2010 8:54 AM

## 2010-12-10 NOTE — Progress Notes (Signed)
Summary: cxr results per append  Phone Note Call from Patient Call back at Home Phone [phone]   Caller: Patient Call For: Darriona Dehaas Summary of Call: Returning Mindy's call. Initial call taken by: Darletta Moll,  October 19, 2010 9:41 AM  Follow-up for Phone Call        pt advised that CXR was clear per append.Carron Curie CMA  October 19, 2010 11:48 AM

## 2010-12-10 NOTE — Assessment & Plan Note (Signed)
Summary: rov for review of pfts   Visit Type:  Follow-up Copy to:  Suzanna Obey MD/ [place] Primary Provider/Referring Provider:  Zoila Shutter MD  CC:  pt here to discuss pft results. Pt states since inhaling the albuterol inhlaer from the pft yesterday and she developed a dry cough and has some sinus congestion.  History of Present Illness: the pt comes in today for f/u of her pfts, ordered as part of a w/u for doe.  She was found to have no obstructive disease, no restriction, and her DLCO was mildly decreased but corrected to normal with alveolar volume adjustment.  I have reviewed the study with her in detail  Current Medications (verified): 1)  Caltrate 600+d  Tabs (Calcium Carbonate-Vitamin D Tabs) .... Take 1 Tablet By Mouth Two Times A Day 2)  Centrum  Tabs (Multiple Vitamins-Minerals) .... Take 1 Tablet By Mouth Once A Day 3)  Vitamin C  Tabs (Ascorbic Acid Tabs) .... Take 1 Tablet By Mouth Once A Day 4)  Famotidine 10 Mg Tabs (Famotidine) .... As Needed 5)  Joint Support Formula   Caps (Glucos-Chondroit-Collag-Hyal) .... 2 Caps Two Times A Day 6)  Medium Compression Hose   Misc (Elastic Bandages & Supports) .... Apply Daily in Am As Directed. 7)  Warfarin Sodium 5 Mg Tabs (Warfarin Sodium) .... Tablet Strength: 5mg  Take As Directed 8)  Fosamax 70 Mg Tabs (Alendronate Sodium) .... Take 1 Tablet By Mouth Once A Week 9)  D 1000 Plus  Tabs (Fa-Cyanocobalamin-B6-D-Ca) .... Take 1 Tablet By Mouth Once A Day 10)  Pravastatin Sodium 20 Mg Tabs (Pravastatin Sodium) .... Take 1 Tablet By Mouth Once A Day 11)  Clobetasol Propionate 0.05 % Crea (Clobetasol Propionate) .... Apply To Affected Area Two Times A Day As Needed 12)  Amlodipine Besylate 2.5 Mg Tabs (Amlodipine Besylate) .... Once Daily 13)  Vesicare 10 Mg Tabs (Solifenacin Succinate) .... One A Day 14)  Metamucil 30.9 % Powd (Psyllium) .Marland Kitchen.. 1 Teaspoon in 8 Oz Water A Day 15)  Aleve 220 Mg Tabs (Naproxen Sodium) .... As  Directed 16)  Lisinopril 20 Mg Tabs (Lisinopril) .... One Daily 17)  Dulcolax Stool Softener .Marland Kitchen.. 1 Tab Two Times A Day As Needed 18)  Atrovent 0.03 % Soln (Ipratropium Bromide) .... Two Sprays Each Nostril 2-3 Times Daily 19)  Hydrochlorothiazide 25 Mg Tabs (Hydrochlorothiazide) .... Once Daily  Allergies (verified): 1)  ! Sulfa 2)  Pcn  Review of Systems       The patient complains of shortness of breath with activity, non-productive cough, acid heartburn, indigestion, weight change, and nasal congestion/difficulty breathing through nose.  The patient denies shortness of breath at rest, productive cough, coughing up blood, chest pain, irregular heartbeats, loss of appetite, abdominal pain, difficulty swallowing, sore throat, tooth/dental problems, headaches, sneezing, itching, ear ache, anxiety, depression, hand/feet swelling, joint stiffness or pain, rash, change in color of mucus, and fever.    Vital Signs:  Patient profile:   75 year old female Height:      65 inches Weight:      218.25 pounds O2 Sat:      100 % on Room air Temp:     98.2 degrees F oral Pulse rate:   72 / minute BP sitting:   148 / 66  (left arm) Cuff size:   large  Vitals Entered By: Carver Fila (November 18, 2010 10:52 AM)  O2 Flow:  Room air CC: pt here to discuss pft results. Pt states  since inhaling the albuterol inhlaer from the pft yesterday, she developed a dry cough and has some sinus congestion Comments meds and allergies updated Phone number updated Carver Fila  November 18, 2010 10:52 AM    Physical Exam  General:  obese female in nad Extremities:  mild edema, no cyanosis  Neurologic:  alert and oriented, moves all 4.   Impression & Recommendations:  Problem # 1:  DYSPNEA ON EXERTION (ICD-786.09)  the pt's full pfts and cxr were unrevealing, and therefore I suspect her doe is related to her weight and conditioning.  I have asked her to work on some type of conditioning program, and to work  on weight reduction.  I would be happy to see her back if needed for new issues.  Other Orders: Est. Patient Level III (16109)  Patient Instructions: 1)  work on weight loss and some type of conditioning program. 2)  will send a note to your primary md regarding my findings.

## 2010-12-10 NOTE — Letter (Signed)
Summary: [place] Walk-In Forms  [place] [facility] Forms   Imported By: Marylou Mccoy 11/16/2010 17:12:31  _____________________________________________________________________  External Attachment:    Type:   Image     Comment:   External Document

## 2010-12-10 NOTE — Progress Notes (Signed)
Summary: pt returning call  Phone Note Call from Patient Call back at Home Phone [phone]   Caller: Patient Reason for Call: Talk to Nurse Initial call taken by: Judie Grieve,  October 23, 2010 3:45 PM    New/Updated Medications: LISINOPRIL 20 MG TABS (LISINOPRIL) one daily   Current Medications (verified): 1)  Caltrate 600+d  Tabs (Calcium Carbonate-Vitamin D Tabs) .... Take 1 Tablet By Mouth Two Times A Day 2)  Centrum  Tabs (Multiple Vitamins-Minerals) .... Take 1 Tablet By Mouth Once A Day 3)  Vitamin C  Tabs (Ascorbic Acid Tabs) .... Take 1 Tablet By Mouth Once A Day 4)  Famotidine 10 Mg Tabs (Famotidine) .... As Needed 5)  Joint Support Formula   Caps (Glucos-Chondroit-Collag-Hyal) .... 2 Caps Two Times A Day 6)  Medium Compression Hose   Misc (Elastic Bandages & Supports) .... Apply Daily in Am As Directed. 7)  Warfarin Sodium 5 Mg Tabs (Warfarin Sodium) .... Tablet Strength: 5mg  Take As Directed 8)  Fosamax 70 Mg Tabs (Alendronate Sodium) .... Take 1 Tablet By Mouth Once A Week 9)  D 1000 Plus  Tabs (Fa-Cyanocobalamin-B6-D-Ca) .... Take 1 Tablet By Mouth Once A Day 10)  Pravastatin Sodium 20 Mg Tabs (Pravastatin Sodium) .... Take 1 Tablet By Mouth Once A Day 11)  Clobetasol Propionate 0.05 % Crea (Clobetasol Propionate) .... Apply To Affected Area Two Times A Day As Needed 12)  Amlodipine Besylate 2.5 Mg Tabs (Amlodipine Besylate) .... Once Daily 13)  Vesicare 10 Mg Tabs (Solifenacin Succinate) .... One A Day 14)  Metamucil 30.9 % Powd (Psyllium) .Marland Kitchen.. 1 Teaspoon in 8 Oz Water A Day 15)  Aleve 220 Mg Tabs (Naproxen Sodium) .... As Directed 16)  Lisinopril 20 Mg Tabs (Lisinopril) .... One Daily 17)  Dulcolax Stool Softener .Marland Kitchen.. 1 Tab Two Times A Day As Needed 18)  Atrovent 0.03 % Soln (Ipratropium Bromide) .... Two Sprays Each Nostril 2-3 Times Daily 19)  Hydrochlorothiazide 25 Mg Tabs (Hydrochlorothiazide) .... Once Daily  Allergies: 1)  ! Sulfa 2)   Pcn increase Lisinopril to 20mg  daily due to increase in B/P --BMP 11/06/10--pt will bring in B/P readings at time of lab 11/06/10

## 2010-12-16 NOTE — Assessment & Plan Note (Signed)
Summary: COU/CH  Anticoagulant Therapy Managed by: [name]. Janie Morning  PharmD CACP Referring MD: Marca Ancona, MD PCP: Zoila Shutter MD [facility] Attending: Reche Dixon MD, Onalee Hua Indication 1: Deep vein thrombus Indication 2: Aftercare long term use Anticoagulants V58.61, V58.83 Start date: 03/25/2006 Duration: Indefinite  Patient Assessment Reviewed by: [name] PharmD  December 07, 2010 Medication review: verified warfarin dosage & schedule,verified previous prescription medications, verified doses & any changes, verified new medications, reviewed OTC medications, reviewed OTC health products-vitamins supplements etc Complications: none Dietary changes: none   Health status changes: none   Lifestyle changes: none   Recent/future hospitalizations: none   Recent/future procedures: none   Recent/future dental: none Patient Assessment Part 2:  Have you MISSED ANY DOSES or CHANGED TABLETS?  No missed Warfarin doses or changed tablets.  Have you had any BRUISING or BLEEDING ( nose or gum bleeds,blood in urine or stool)?  No reported bruising or bleeding in nose, gums, urine, stool.  Have you STARTED or STOPPED any MEDICATIONS, including OTC meds,herbals or supplements?  No other medications or herbal supplements were started or stopped.  Have you CHANGED your DIET, especially green vegetables,or ALCOHOL intake?  No changes in diet or alcohol intake.  Have you had any ILLNESSES or HOSPITALIZATIONS?  No reported illnesses or hospitalizations  Have you had any signs of CLOTTING?(chest discomfort,dizziness,shortness of breath,arms tingling,slurred speech,swelling or redness in leg)    No chest discomfort, dizziness, shortness of breath, tingling in arm, slurred speech, swelling, or redness in leg.     Treatment  Target INR: 2.0-3.0 INR: 2.6  Date: 12/07/2010 Regimen In:  32.5mg /week INR reflects regimen in: 2.6  New  Tablet strength: : 5mg  Regimen Out:     Sunday: 1 Tablet  Monday: 1 Tablet     Tuesday: 1 Tablet     Wednesday: 1/2 Tablet     Thursday: 1 Tablet      Friday: 1 Tablet     Saturday: 1 Tablet Total Weekly: 32.5mg /week mg  Next INR Due: 01/04/2011 Adjusted by: [name]. [name] PharmD CACP   Return to anticoagulation clinic:  01/04/2011 Time of next visit: 0915    Allergies: 1)  ! Sulfa 2)  Pcn  Appended Document: COU/CH Patient requests next visit to be 12-Mar-12 citing that she is going to be OOT during the interval for which next scheduled 4 week visit would have fell. I reminded her to be hypervigilent during this time and should any signs/symptoms suggestive of hypoprothrombinemic responsiveness occur--to call me and we would see her when necessary. Otherwise, 12-Mar-12.

## 2011-01-25 ENCOUNTER — Ambulatory Visit (INDEPENDENT_AMBULATORY_CARE_PROVIDER_SITE_OTHER): Payer: Medicare Other | Admitting: Pharmacist

## 2011-01-25 DIAGNOSIS — Z86718 Personal history of other venous thrombosis and embolism: Secondary | ICD-10-CM

## 2011-01-25 DIAGNOSIS — Z7901 Long term (current) use of anticoagulants: Secondary | ICD-10-CM

## 2011-01-25 DIAGNOSIS — D682 Hereditary deficiency of other clotting factors: Secondary | ICD-10-CM

## 2011-01-25 DIAGNOSIS — I82409 Acute embolism and thrombosis of unspecified deep veins of unspecified lower extremity: Secondary | ICD-10-CM

## 2011-01-25 LAB — POCT INR: INR: 3.2

## 2011-01-25 NOTE — Patient Instructions (Signed)
Patient instructed to take medications as defined in the Anti-coagulation Track section of this encounter.  Patient instructed to take today's dose.  Patient verbalized understanding of these instructions.    

## 2011-01-25 NOTE — Progress Notes (Signed)
Anti-Coagulation Progress Note  Judith [name] is a 75 y.o. female who is currently on an anti-coagulation regimen.    RECENT RESULTS: Recent results are below, the most recent result is correlated with a dose of 32.5 mg. per week: Lab Results  Component Value Date   INR 3.2 01/25/2011   INR 2.6 12/07/2010   INR 3.3 11/09/2010    ANTI-COAG DOSE:   Latest dosing instructions   Total Sun Mon Tue Wed Thu Fri Sat   30 5 mg 2.5 mg 5 mg 5 mg 2.5 mg 5 mg 5 mg    (5 mg1) (5 mg0.5) (5 mg1) (5 mg1) (5 mg0.5) (5 mg1) (5 mg1)         ANTICOAG SUMMARY: Anticoagulation Episode Summary              Current INR goal  Next INR check 02/22/2011   INR from last check 3.2 (01/25/2011)     Weekly max dose (mg)  Target end date    Indications FACTOR V DEFICIENCY, PULMONARY EMBOLISM, HX OF, DVT, Long term current use of anticoagulant   INR check location  Preferred lab    Send INR reminders to [facility]   Comments        Provider Role Specialty Phone number   Levada Schilling [facility]  Internal Medicine [phone]        ANTICOAG TODAY: Anticoagulation Summary as of 01/25/2011              INR goal      Selected INR 3.2 (01/25/2011) Next INR check 02/22/2011   Weekly max dose (mg)  Target end date    Indications FACTOR V DEFICIENCY, PULMONARY EMBOLISM, HX OF, DVT, Long term current use of anticoagulant    Anticoagulation Episode Summary              INR check location  Preferred lab    Send INR reminders to ANTICOAG IMP   Comments        Provider Role Specialty Phone number   Levada Schilling [facility]  Internal Medicine [phone]        PATIENT INSTRUCTIONS: Patient Instructions  Patient instructed to take medications as defined in the Anti-coagulation Track section of this encounter.  Patient instructed to take today's dose.  Patient verbalized understanding of these instructions.        FOLLOW-UP Return in 4 weeks (on 02/22/2011) for Follow up INR.  Hulen Luster, III Pharm.D.,  CACP

## 2011-02-22 ENCOUNTER — Encounter: Payer: Self-pay | Admitting: Pharmacist

## 2011-02-22 ENCOUNTER — Other Ambulatory Visit (INDEPENDENT_AMBULATORY_CARE_PROVIDER_SITE_OTHER): Payer: Medicare Other | Admitting: Pharmacist

## 2011-02-22 ENCOUNTER — Ambulatory Visit (INDEPENDENT_AMBULATORY_CARE_PROVIDER_SITE_OTHER): Payer: Medicare Other | Admitting: Pharmacist

## 2011-02-22 DIAGNOSIS — Z7901 Long term (current) use of anticoagulants: Secondary | ICD-10-CM

## 2011-02-22 DIAGNOSIS — I82409 Acute embolism and thrombosis of unspecified deep veins of unspecified lower extremity: Secondary | ICD-10-CM

## 2011-02-22 DIAGNOSIS — Z86718 Personal history of other venous thrombosis and embolism: Secondary | ICD-10-CM

## 2011-02-22 DIAGNOSIS — D682 Hereditary deficiency of other clotting factors: Secondary | ICD-10-CM

## 2011-02-22 LAB — POCT INR: INR: 3

## 2011-02-22 MED ORDER — WARFARIN SODIUM 5 MG PO TABS: ORAL_TABLET | ORAL | Status: DC

## 2011-02-22 NOTE — Progress Notes (Signed)
Anti-Coagulation Progress Note  Judith [name] is a 75 y.o. female who is currently on an anti-coagulation regimen.    RECENT RESULTS: Recent results are below, the most recent result is correlated with a dose of 30 mg. per week: Lab Results  Component Value Date   INR 3.0 02/22/2011   INR 3.2 01/25/2011   INR 2.6 12/07/2010    ANTI-COAG DOSE:   Latest dosing instructions   Total Sun Mon Tue Wed Thu Fri Sat   30 5 mg 2.5 mg 5 mg 5 mg 2.5 mg 5 mg 5 mg    (5 mg1) (5 mg0.5) (5 mg1) (5 mg1) (5 mg0.5) (5 mg1) (5 mg1)         ANTICOAG SUMMARY: Anticoagulation Episode Summary              Current INR goal 2.0-3.0 Next INR check 03/22/2011   INR from last check 3.0 (02/22/2011)     Weekly max dose (mg)  Target end date Indefinite   Indications FACTOR V DEFICIENCY, PULMONARY EMBOLISM, HX OF, DVT, Long term current use of anticoagulant   INR check location [facility] Preferred lab    Send INR reminders to [facility]   Comments        Provider Role Specialty Phone number   Levada Schilling [facility]  Internal Medicine [phone]        ANTICOAG TODAY: Anticoagulation Summary as of 02/22/2011              INR goal 2.0-3.0     Selected INR 3.0 (02/22/2011) Next INR check 03/22/2011   Weekly max dose (mg)  Target end date Indefinite   Indications FACTOR V DEFICIENCY, PULMONARY EMBOLISM, HX OF, DVT, Long term current use of anticoagulant    Anticoagulation Episode Summary              INR check location [facility] Preferred lab    Send INR reminders to [facility]   Comments        Provider Role Specialty Phone number   Levada Schilling [facility]  Internal Medicine [phone]        PATIENT INSTRUCTIONS: Patient Instructions  Patient instructed to take medications as defined in the Anti-coagulation Track section of this encounter.  Patient instructed to take today's dose.  Patient verbalized understanding of these instructions.        FOLLOW-UP Return in 4 weeks (on  03/22/2011) for Follow up INR.  Hulen Luster, III Pharm.D., CACP

## 2011-02-22 NOTE — Patient Instructions (Signed)
Patient instructed to take medications as defined in the Anti-coagulation Track section of this encounter.  Patient instructed to take today's dose.  Patient verbalized understanding of these instructions.    

## 2011-03-15 ENCOUNTER — Encounter: Payer: Self-pay | Admitting: Internal Medicine

## 2011-03-15 DIAGNOSIS — D682 Hereditary deficiency of other clotting factors: Secondary | ICD-10-CM

## 2011-03-15 DIAGNOSIS — R0602 Shortness of breath: Secondary | ICD-10-CM | POA: Insufficient documentation

## 2011-03-15 DIAGNOSIS — I10 Essential (primary) hypertension: Secondary | ICD-10-CM

## 2011-03-16 ENCOUNTER — Ambulatory Visit (INDEPENDENT_AMBULATORY_CARE_PROVIDER_SITE_OTHER): Payer: Medicare Other | Admitting: Internal Medicine

## 2011-03-16 ENCOUNTER — Encounter: Payer: Self-pay | Admitting: Internal Medicine

## 2011-03-16 VITALS — BP 116/68 | HR 73 | Temp 97.1°F | Ht 64.25 in | Wt 215.9 lb

## 2011-03-16 DIAGNOSIS — D682 Hereditary deficiency of other clotting factors: Secondary | ICD-10-CM

## 2011-03-16 DIAGNOSIS — Z515 Encounter for palliative care: Secondary | ICD-10-CM | POA: Insufficient documentation

## 2011-03-16 DIAGNOSIS — Z0000 Encounter for general adult medical examination without abnormal findings: Secondary | ICD-10-CM

## 2011-03-16 DIAGNOSIS — J309 Allergic rhinitis, unspecified: Secondary | ICD-10-CM | POA: Insufficient documentation

## 2011-03-16 DIAGNOSIS — E785 Hyperlipidemia, unspecified: Secondary | ICD-10-CM

## 2011-03-16 DIAGNOSIS — I10 Essential (primary) hypertension: Secondary | ICD-10-CM

## 2011-03-16 DIAGNOSIS — Z7901 Long term (current) use of anticoagulants: Secondary | ICD-10-CM

## 2011-03-16 LAB — COMPREHENSIVE METABOLIC PANEL
ALT: 14 U/L (ref 0–35)
AST: 20 U/L (ref 0–37)
Albumin: 4.2 g/dL (ref 3.5–5.2)
Alkaline Phosphatase: 70 U/L (ref 39–117)
BUN: 27 mg/dL — ABNORMAL HIGH (ref 6–23)
CO2: 24 mEq/L (ref 19–32)
Calcium: 9.4 mg/dL (ref 8.4–10.5)
Chloride: 106 mEq/L (ref 96–112)
Creat: 0.91 mg/dL (ref 0.40–1.20)
Glucose, Bld: 80 mg/dL (ref 70–99)
Potassium: 5 mEq/L (ref 3.5–5.3)
Sodium: 141 mEq/L (ref 135–145)
Total Bilirubin: 0.3 mg/dL (ref 0.3–1.2)
Total Protein: 6.7 g/dL (ref 6.0–8.3)

## 2011-03-16 LAB — LIPID PANEL
Cholesterol: 211 mg/dL — ABNORMAL HIGH (ref 0–200)
HDL: 65 mg/dL (ref >39)
LDL Cholesterol: 121 mg/dL — ABNORMAL HIGH (ref 0–99)
Total CHOL/HDL Ratio: 3.2 Ratio
Triglycerides: 125 mg/dL (ref <150)
VLDL: 25 mg/dL (ref 0–40)

## 2011-03-16 LAB — POCT INR: INR: 3.1

## 2011-03-16 NOTE — Assessment & Plan Note (Addendum)
Will refill nonsedating antihistamine fexofenadine 180 mg once a day.

## 2011-03-16 NOTE — Patient Instructions (Signed)
I will see you back in 6 months. Keep up the good work with walking as much as you can. Enjoy retirement with your husband.

## 2011-03-16 NOTE — Assessment & Plan Note (Signed)
Excellent control continue present medications.

## 2011-03-16 NOTE — Assessment & Plan Note (Signed)
Dr. Alexandria Lodge has asked INR to be drawn today we have done. I will send him the results

## 2011-03-16 NOTE — Progress Notes (Signed)
  Subjective:    Patient ID: Judith [name], female    DOB: September 30, 1936, 75 y.o.   MRN: [id number]  HPI 75 year old patient who comes in for four-month followup. Issues also fasting and needs fasting blood work done today. She has a history of hypertension, hyperlipidemia, DVT and PE with history of factor V deficiency. Therefore she is on long-term use of Coumadin followed by Dr.Groce in the clinic.  Chest had full workup for shortness of breath with negative cardiac and pulmonary workup in 2007. Also been trying to encourage her to exercise. She has been walking some-i.e. To the mailbox, then she will get some pain in her bilateral buttox area and rest and it would go away.  Patient is getting every 6 months of Pap smears because of her cervical cancer by Dr. Loree Fee. She is up-to-date on mammograms. She did just get a letter that she needs to get a colonoscopy done we'll get that scheduled. Dr. Alexandria Lodge has told her that she needs to be off Coumadin for 3 days. I have told her to make sure that that's okay with GI.   Review of Systems  Constitutional: Negative for fatigue.  Respiratory: Negative for shortness of breath.   Cardiovascular: Negative for chest pain.  Gastrointestinal: Negative for nausea, vomiting and abdominal pain.       Objective:   Physical Exam  Constitutional: She appears well-developed and well-nourished.  HENT:  Head: Normocephalic and atraumatic.  Neck: Normal range of motion. Neck supple.  Cardiovascular: Normal rate and regular rhythm.   Pulmonary/Chest: Breath sounds normal.  Abdominal: Bowel sounds are normal.  Musculoskeletal: She exhibits no edema.  Psychiatric: She has a normal mood and affect.          Assessment & Plan:

## 2011-03-22 NOTE — Progress Notes (Signed)
Addended by: Alric Quan on: 03/22/2011 09:01 AM   Modules accepted: Orders

## 2011-03-26 ENCOUNTER — Encounter: Payer: Self-pay | Admitting: *Deleted

## 2011-03-26 NOTE — Consult Note (Signed)
NAMECACI, ORREN NO.:  [id number]   MEDICAL RECORD NO.:  [id number]          PATIENT TYPE:  OUT   LOCATION:  GYN                          FACILITY:  [facility]   PHYSICIAN:  John T. Kyla Balzarine, M.D.    DATE OF BIRTH:  05-16-1936   DATE OF CONSULTATION:  DATE OF DISCHARGE:                                   CONSULTATION   CHIEF COMPLAINT:  This 75 year old para 2 0-0-1 woman is seen at the request  of Dr. Elana Alm for upper vaginal/cervical dysplasia.   HISTORY OF PRESENT ILLNESS:  Patient relates that she was treated for stage  IB adenocarcinoma of the cervix in 1983 with two cesium  insertions followed  by pelvic external beam radiotherapy.  She has been followed without  evidence of an invasive recurrence.  She has had frequent abnormal Pap  smears with biopsies apparently showing radiation change only.  Her most  recent Pap smear was significant for high-grade SIL.  She underwent  colposcopy on September 21, 2004.  Unfortunately, the colposcopy note is not  present.  I did obtain further information from Dr. Elana Alm in verbal  communication.  The patient had white epithelium and radiation changes in  the upper vagina with no classical acetowhite epithelium.  Multiple biopsies  were obtained, and all fragments had high grade squamous intraepithelial  lesion, CIN-2.   The patient relates no problems with bleeding or postcoital bleeding.  She  does have lack of vaginal lubrication.  She has been on no hormone  replacement therapy.   PAST MEDICAL HISTORY:  1.  Osteoporosis.  2.  Chronic sinusitis.  3.  Cervical carcinoma, as above, treated with radiotherapy.   PAST SURGICAL HISTORY:  1.  Cleft lip/palate repair.  2.  Nasal septum repair.  3.  Hammertoe repair.  4.  Arthroscopy.   MEDICATIONS:  1.  Actonel.  2.  Detrol LA.   ALLERGIES:  PENICILLIN produces a rash.   PERSONAL/SOCIAL HISTORY:  Patient is married and denies tobacco, admitting  rare  ethanol.   FAMILY HISTORY:  Maternal aunt and cousin had malignancies of unknown  primary site.   REVIEW OF SYSTEMS:  GENERAL:  Denies weight loss, fevers or chills, malaise  or fatigue.  ENT:  Chronic sinusitis without recent infection.  PULMONARY:  Denies cough, hemoptysis, frequent pneumonia, but was diagnosed with  walking pneumonia in 1998.  CARDIOVASCULAR:  Denies chest pain, dyspnea on  exertion, PND, orthopnea, arrhythmia.  GI:  Chronic constipation with no  change in bowel function.  GU:  Urinary urgency with occasional  incontinence, improved on Detrol LA.  OB/GYN:  As per HPI.  MUSCULOSKELETAL:  Chronic low back pain related to osteoporosis.  HEME/LYMPH:  The patient has  a history of thrombophlebitis in the 1970s following prolonged sitting.  She was treated with either Coumadin or nonsteroidal anti-inflammatory  medications for a few weeks but was not on prolonged anticoagulation.  She  relates painful palpable cords, suggesting superficial thrombophlebitis.  NEUROLOGIC:  Negative.   PHYSICAL EXAMINATION:  VITAL SIGNS:  Weight 207 pounds.  Blood pressure  160/90, pulse  78, respirations 18, temperature afebrile.  GENERAL:  The patient is alert and oriented in no acute distress.  HEENT:  Benign with clear oropharynx.  NECK:  Supple without goiter or lymphadenopathy.  LUNGS:  Lung fields are clear to auscultation and percussion.  HEART:  Heart sounds are regular without gallop, murmur, or JVD.  LYMPH:  Negative for significant adenopathy.  There is no back or CVA  tenderness.  ABDOMEN:  Soft and benign without ascites, mass, or organomegaly.  EXTREMITIES:  Full strength and range of motion without edema.  SKIN:  No suspicious lesions.  PELVIC:  External genitalia and BUS are normal to inspection and palpation.  The vagina has no gross lesions other than radiation change with  telangiectasia coning with loss of vaginal fornices in the upper vagina.  The cervix is flush but  is visualized.  Bimanual and rectovaginal  examinations reveal no evidence of uterine enlargement, adnexal mass,  cervical expansion, or parametrial nodularity.  After informed consent  obtained, I performed a colposcopy of the upper vagina, confirming slight  acetowhite chain to the upper quarter of the vagina without classical dense  acetowhite epithelium.  There are multiple telangiectasia compatible with  radiation change and scattered punctation.  Healing biopsy sites are  present, but no atypical vascularity suggesting malignancy.  Colposcopy is  inadequate, as I am unable to see transformation zone.   ASSESSMENT:  At least moderate dysplasia involving the upper vagina and  cervix with inadequate colposcopy.  History of cervical carcinoma treated  with radiotherapy.   RECOMMENDATIONS:  I discussed the patient by telephone with Dr. Elana Alm.  I  recommended an examination under anesthesia with multiple biopsies and cone  biopsy of the cervix is physically possible, to plan treatment.  Likely, she  will require a total abdominal hysterectomy and upper colpectomy.  I  explained the rationale for this  procedure to the patient, including risks and benefits.  We will pretreat  her with Premarin vaginal cream BIW for one month in order to facilitate the  examination and biopsies.  Surgery is tentatively scheduled to be performed  by Dr. Stanford Breed on November 24, 2004.     John   JTS/MEDQ  D:  10/13/2004  T:  10/13/2004  Job:  [id number]   cc:   Telford Nab, R.N.  [address]  [place], [place] [address]   S. Kyra Manges, M.D.  [phone] [address]  [place]  [place] [address]  Fax: [phone]

## 2011-03-26 NOTE — Procedures (Signed)
[facility]  Patient:    Judith [name], Judith [name]                       MRN: [id number] Proc. Date: 10/11/00 Adm. Date:  [id number] Attending:  Charna Elizabeth CC:         Geraldo Pitter, M.D.   Procedure Report  DATE OF BIRTH:  1936-08-12.  REFERRING PHYSICIAN:  Geraldo Pitter, M.D.  PROCEDURE PERFORMED:  Colonoscopy.  ENDOSCOPIST:  Anselmo Rod, M.D.  INSTRUMENT USED:  Olympus video colonoscope.  INDICATIONS FOR PROCEDURE:  Blood in stool in a 75 year old white female with a personal history of cervical cancer diagnosed in 11.  Rule out colonic polyps, masses, hemorrhoids, etc.  PREPROCEDURE PREPARATION:  Informed consent was procured from the patient. The patient was fasted for eight hours prior to the procedure and prepped with a bottle of magnesium citrate and a gallon of NuLytely the night prior to the procedure.  PREPROCEDURE PHYSICAL:  The patient had stable vital signs.  Neck supple. Chest clear to auscultation.  S1, S2 regular.  Abdomen soft with normal abdominal bowel sounds.  DESCRIPTION OF PROCEDURE:  The patient was placed in the left lateral decubitus position and sedated with 75 mg of Demerol and 9 mg of Versed intravenously.  Once the patient was adequately sedated and maintained on low-flow oxygen and continuous cardiac monitoring, the Olympus video colonoscope was advanced from the rectum to the cecum with slight difficulty secondary to some residual stool in the colon.  Multiple washings were done and the underlying mucosa was clearly visualized.  The patient had a small external hemorrhoid and small nonbleeding internal hemorrhoids seen on retroflexion.  There were some radiation changes seen in rectum but there was no evidence of active radiation proctitis.  The patient tolerated the procedure well without complication.  No masses, polyp, erosions, ulcerations were seen.  There was no evidence of  diverticulosis.  IMPRESSION: 1. Essentially healthy-appearing colon except for small internal and external    hemorrhoids. 2. Old radiation changes in rectum.  No evidence of active proctitis. 3. No masses or polyps seen.  RECOMMENDATIONS: 1. The patient has been advised to increase the fluid and fiber in her diet. 2. Outpatient follow-up is advised on a p.r.n. basis. 3. Repeat colorectal screen is recommended in the next five years or earlier    if the patient were to develop any abnormal symptoms in the interim. DD:  10/11/00 TD:  10/11/00 Job: [id number]

## 2011-03-26 NOTE — Consult Note (Signed)
NAME:  Judith [name], Judith [name] NO.:  [id number]   MEDICAL RECORD NO.:  [id number]          PATIENT TYPE:  OUT   LOCATION:  GYN                          [facility]ACILITY:  [facility]   PHYSICIAN:  De Blanch, M.D.DATE OF BIRTH:  06/23/1936   DATE OF CONSULTATION:  12/29/2004  DATE OF DISCHARGE:                                   CONSULTATION   REASON FOR CONSULTATION:  A 75 year old returns for postoperative follow-up,  having undergone an upper vaginectomy and cold knife conization on November 24, 2004. She has had an uncomplicated postoperative course. Denies any  vaginal bleeding, discharge, or any pelvic pain or pressure. She continues  to have stress incontinence which she has had prior to the surgery and prior  to her radiation therapy.   Final pathology showed a low-grade squamous intraepithelial lesion. There  was no residual high-grade dysplasia and all surgical margins were free.   PHYSICAL EXAMINATION:  ABDOMEN:  Soft, nontender.  No mass, organomegaly,  ascites, or hernias noted.  PELVIC:  EG/BUS is normal. The vagina is normal. The vaginal cuff is healing  well. Some Vicryl sutures still remain. Bimanual exam reveals minimal  postoperative induration. No masses or nodularity.   IMPRESSION:  1.  Past history of cervical cancer, clinically free of disease.  2.  Recent history of high-grade dysplasia documented on biopsy status post      cold knife conization and upper vaginectomy with mild dysplasia and      negative margins.   PLAN:  The patient is given the okay to return to full levels of activity.  She will abstain from sexual intercourse for approximately 3 more weeks  while the vagina completely heals.   We will have her return to the care of Dr. Elana Alm and would anticipate that  she should have a Pap smear in approximately 6 months.      DC/MEDQ  D:  12/29/2004  T:  12/29/2004  Job:  [id number]   cc:   S. Kyra Manges, M.D.  [phone] [address]  [place]  [place] [address]  Fax: [phone]   Telford Nab, R.N.  [address]  [place], [place] [address]

## 2011-03-26 NOTE — Discharge Summary (Signed)
NAME:  Judith [name], Judith [name] NO.:  [id number]   MEDICAL RECORD NO.:  [id number]          PATIENT TYPE:  INP   LOCATION:  4703                         FACILITY:  [facility]   PHYSICIAN:  Eliseo Gum, M.D.   DATE OF BIRTH:  1936-08-23   DATE OF ADMISSION:  03/25/2006  DATE OF DISCHARGE:  03/28/2006                                 DISCHARGE SUMMARY   DISCHARGE DIAGNOSES:  1.  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  2.  Pulmonary embolus.  3.  History of cervical cancer in 1983, recurrence January 2006, status post      radiation therapy, partial hysterectomy and colposcopy in 1983, and      upper vaginectomy in January 2006.  4.  Osteoporosis.  5.  Chronic sinusitis.  6.  Stress incontinence.  7.  __________ repair.  8.  Nasal septum repair.  9.  Hand and shoulder repair.  10. Status post colonoscopy in 2001 that was normal.  11. Hypertension while in the hospital.   DISCHARGE MEDICATIONS:  1.  Lovenox 100 mg 1 injection twice daily.  2.  Coumadin 3 mg daily until appointment with Dr. Michaell Cowing on May 24.  3.  Actonel 35 mg p.o. daily.  4.  Detrol 4 mg p.o. daily.  5.  Hexobendine 180 mg p.o. daily.  6.  Caltrate 500 plus vitamin D p.o. twice daily.  7.  Biotin 1500 mcg as prior to hospitalization.  8.  Chromium picolinate 400 mg p.o. daily as prior to hospitalization.  9.  Vitamin C and vitamin E as prior to hospitalization.   DISPOSITION:  The patient was discharged in stable state.  Her INR was  therapeutic for the first day on the day of discharge at 2.3.  She will go  home with a 3-day supply of Lovenox 100 mg daily as well as Coumadin 3 mg  p.o. daily, and she will see Dr. Michaell Cowing in the outpatient clinic on Thursday,  May 24, at 3:15 p.m. who will decide further management of her  anticoagulation. She will also follow up in the outpatient clinic with her  new primary care physician, Dr. Lorel Monaco, on June 8 at 1:30  p.m., and at that  point it would be particularly important to get records from Dr. Elana Alm as  to the workup that is currently being done for her history of cervical  malignancy as well as schedule her for a colonoscopy with Dr. Loreta Ave, as it  has been 6 years, and follow up on her blood pressure as it had been  elevated on a few occasions while in the hospital.   ADMISSION HISTORY AND PHYSICAL:  Ms. 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  The patient  stated that she noticed the swelling in her left leg started last July 2006  as well as some leg discoloration after she had an 8-hour driving trip to  [place].  She did not see a physician for this, and the symptoms were improving  with elevation of her leg.  Then approximately a month prior to admission,  she noticed swelling in her right leg as well which was made worse  immediately after walking just  a few steps and was also painful along her  right leg with some discoloration as well as pain in her left calf. The  patient admitted to some dyspnea on exertion, but this is not new.  She  denies any new onset dyspnea or chest pain. She denies hemoptysis.   PHYSICAL EXAMINATION:  Temperature 96.4, blood pressure 189/92, heart rate  96, respiratory rate 18, saturating at 97% on room air.  She was in no acute  distress.  Her pupils equal, round, and reactive to light and accommodation.  Her oropharynx was mostly clear with slight tonsil enlargement.  She had no  cervical lymphadenopathy, no thyromegaly.  Her lungs were clear to  auscultation bilaterally.  Her heart and abdominal exams were  completely  normal.  On her lower extremities, she had bilateral 2+ pitting edema, a  positive Homan's sign on the right, and she had tenderness on her left calf  as well as her right calf and right inner thigh.  Her right leg seems  slightly bigger than the left one.  Her skin was moist and warm.  NEUROLOGIC:  Grossly intact.   LABORATORY DATA:  She had a WBC of 6.7, hemoglobin 12.4, platelets 322.  Sodium 141, potassium 4, chloride 107, CO2 28, BUN 19, creatinine 0.9,  glucose 91.  Her hepatic panel was completely normal.  A lower extremity  Doppler was performed and showed acute on chronic DVT on the right leg from  the calf to the thigh as well as chronic DVT of the left leg.  An ABG was  performed and showed a pH of 7.465, pCO2 of 37, pO2 67, bicarbonate 27, and  an AA gradient was 36.2, and normal for her age is 36.   HOSPITAL COURSE:  The patient was admitted for treatment of acute on chronic  DVTs as well as evaluation of abnormal ABG.  She was started on full-dose  Lovenox at 95 mg subcutaneously twice daily and also started on Coumadin.  A  hypercoagulable panel was drawn including antiphospholipids, homocysteine,  lupus anticoagulant, cardiolipin, Factor V Leiden, antithrombin-3, and  protein C and S levels.   A CT angiogram of her chest was also performed to rule out PE and was  actually positive for a small pulmonary embolus with the configuration and  appearance of subacute or chronic PE.  Throughout the hospitalization, her  saturation stayed fine, and she was not short of breath.   Of note, other labs drawn including a fasting lipid panel which showed total  cholesterol 198, triglycerides 151, HDL 50, and LDL 118.  Her TSH was 1.467  which is normal.  Homocysteine level was slightly increased at 16.8.  A 2-D  echocardiogram was done, and the results are still pending.  The results of her hypercoagulable panel are also all pending and will be followed in the  outpatient  clinic by Dr. Lorel Monaco.   As far as malignancy workup, the patient is up to date with  mammograms, the  last  one being 6-8 months ago.  She mentioned that her last  colon cancer  screening was performed by colonoscopy by Dr. Loreta Ave in 2001 and that she is  seen regularly by her OB-GYN, Dr. Elana Alm, for past surgical cancer.  On her  followup appointment, I will contact Dr. Elana Alm to obtain records, making  sure that her followup is up to date.   The patient will be discharged to home today, May 21, with an INR first day  therapeutic of 2.3 and 3-day supply of Lovenox injections as well as  Coumadin 3 mg daily, and she will see Dr. Michaell Cowing in 3 days.      Dellia Beckwith, M.D.    ______________________________  Eliseo Gum, M.D.    VD/MEDQ  D:  03/28/2006  T:  03/28/2006  Job:  [id number]   cc:   Dr. Michaell Cowing, Outpatient Clinic   Dellia Beckwith, M.D.  Fax: [phone]   S. Kyra Manges, M.D.  Fax: [phone]

## 2011-03-26 NOTE — Consult Note (Signed)
NAME:  Judith [name], Judith [name] NO.:  [id number]   MEDICAL RECORD NO.:  [id number]          PATIENT TYPE:  OUT   LOCATION:  GYN                          FACILITY:  [facility]   PHYSICIAN:  De Blanch, M.D.DATE OF BIRTH:  1936/05/15   DATE OF CONSULTATION:  11/17/2004  DATE OF DISCHARGE:                                   CONSULTATION   A 75 year old white female returns to continue followup evaluation.  She has  had a recent history of upper vaginal dysplasia.   The patient has a past history of carcinoma of the cervix stage IIB,  adenocarcinoma treated with radiation therapy 1983.   The patient is currently using Premarin cream to mature the vaginal mucosa.   PAST MEDICAL HISTORY:  Medical illnesses:  1.  Osteoporosis.  2.  Chronic sinusitis.  3.  Cervix cancer 1983.   PAST SURGICAL HISTORY:  1.  Cleft lip repair.  2.  Nasal septum repair.  3.  Hammertoe repair.  4.  Arthroscopy.   MEDICATIONS:  1.  Actonel.  2.  Detrol LA.   DRUG ALLERGY:  PENICILLIN CAUSES A RASH.   The patient is married, she does not smoke.   FAMILY HISTORY:  Negative for gynecologic, breast or colon cancer.   REVIEW OF SYSTEMS:  Negative except as noted above.   PHYSICAL EXAMINATION:  Weight 204 pounds, blood pressure 180/100.  GENERAL:  The patient is a healthy white female in no acute distress.  HEENT:  Negative.  NECK:  Supple without thyromegaly.  There is no supraclavicular or inguinal  adenopathy.  ABDOMEN:  Soft, nontender, no masses, organomegaly, ascites or hernias are  noted.  PELVIC:  EG BUS, vagina body, urethra, are normal except for radiation  changes in the upper vagina.  No gross lesions are noted.  The vagina is  ______atrophic____ and the cervix cannot be visualized.   PROCEDURE NOTE:  Colposcopic examination is performed of the vagina.  There  seems to be an area of dense white epithelium at the very vaginal apex.  Other than that, I do not see any  discrete abnormalities.   IMPRESSION:  Dysplasia of the upper vagina/cervix status post radiation  therapy for cervical cancer.  I would recommend the patient undergo upper  colpectomy and cold knife conization for definite diagnosis and hopefully  definitive treatment.  The risks of surgery included injury to the bladder  and rectum were discussed with the patient, fistula is certainly a rare  possibility, and the consequences are discussed.  All of her questions are  answered, we will proceed with surgery as scheduled for next week.      DC/MEDQ  D:  11/17/2004  T:  11/17/2004  Job:  [id number]   cc:   S. Kyra Manges, M.D.  [phone] [address]  [place]  [place] [address]  Fax: [phone]   Telford Nab, R.N.  [address]  [place], [place] [address]

## 2011-03-26 NOTE — Op Note (Signed)
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   POSTOPERATIVE DIAGNOSIS:  Carcinoma in situ of the vagina and possibly  cervix.   PROCEDURE:  Upper vaginectomy and trachelectomy.   SURGEON:  De Blanch, M.D.   ASSISTANT:  Telford Nab, R.N.   ANESTHESIA:  General with orotracheal tube.   ESTIMATED BLOOD LOSS:  5 cc.   SURGICAL FINDINGS:  Examination under anesthesia revealed the upper vagina  was agglutinated.  The cervix could not be visualized.  After staining with  acetic acid, there was some faint white epithelium in the upper vagina but  this was not very well demarcated.  There was no nodularity.  Bleeding was  minimal.   Before closing the vaginal apex, the bladder was filled with saline and  inspected and no leak was identified.   PROCEDURE:  The patient was brought to the operating room and after  satisfactory attainment of general anesthesia was placed in the lithotomy  position in candy cane stirrups.  The perineum and vagina were prepped with  Betadine.  The bladder was emptied with the straight red rubber catheter and  the patient was draped.  The patient was examined with the above-noted  findings.  A weighted speculum was placed in the posterior vagina.  A 1%  Xylocaine with epinephrine was then injected circumferentially approximately  3 cm from the vaginal apex which I believe was outside the distal margin of  her previously documented carcinoma in situ.  Vaginal mucosa was then  incised circumferentially.  Using Metzenbaum scissors, a vaginal mucosal  edge was  lifted circumferentially.  Four 2-0 silk sutures were then placed  in each quadrant for traction.  With traction down on the vaginal mucosa, it  continued to dissect between the bladder and vagina, between the vagina and  the rectum, and laterally.  There was minimal bleeding.  Hemostasis was  achieved with cautery.  Continued to dissect the entire vagina away from the  bladder and rectum until we got to the cervix.  Continue to dissect along  this plane until approximately 2 cm of cervix had been exposed.  The cervix  was then amputated using electrocautery.  A Foley catheter was placed in the  bladder and the bladder was filled with saline.  The base of the bladder was  carefully inspected and no leaking was noted.  The bladder was then emptied.  The vaginal mucosa was then reapproximated in a horizontal line using  interrupted figure-of-eight sutures of 2-0 Vicryl.  At the end of this  procedure, hemostasis was excellent.  The  patient was awakened from anesthesia and taken to the recovery room in  satisfactory condition.  Sponge, needle, and instrument  counts were correct  x2.  The suture at 12 o'clock was tied and used to mark the 12 o'clock  position for pathologist.  The specimen was then submitted to pathology.      DC/MEDQ  D:  11/24/2004  T:  11/24/2004  Job:  [id number]   cc:   S. Kyra Manges, M.D.  [phone] [address]  [place]  [place] [address]  Fax: [phone]   Telford Nab, R.N.  [address]  [place], [place] [address]

## 2011-04-19 ENCOUNTER — Ambulatory Visit (INDEPENDENT_AMBULATORY_CARE_PROVIDER_SITE_OTHER): Payer: Medicare Other | Admitting: Pharmacist

## 2011-04-19 DIAGNOSIS — D682 Hereditary deficiency of other clotting factors: Secondary | ICD-10-CM

## 2011-04-19 DIAGNOSIS — Z86718 Personal history of other venous thrombosis and embolism: Secondary | ICD-10-CM

## 2011-04-19 DIAGNOSIS — Z7901 Long term (current) use of anticoagulants: Secondary | ICD-10-CM

## 2011-04-19 DIAGNOSIS — I82409 Acute embolism and thrombosis of unspecified deep veins of unspecified lower extremity: Secondary | ICD-10-CM

## 2011-04-19 LAB — POCT INR: INR: 2.8

## 2011-04-19 NOTE — Progress Notes (Signed)
Anti-Coagulation Progress Note  Neisha Hinger is a 75 y.o. female who is currently on an anti-coagulation regimen.    RECENT RESULTS: Recent results are below, the most recent result is correlated with a dose of 30 mg. per week: Lab Results  Component Value Date   INR 2.80 04/19/2011   INR 3.1 03/16/2011   INR 3.0 02/22/2011    ANTI-COAG DOSE:   Latest dosing instructions   Total Sun Mon Tue Wed Thu Fri Sat   30 5 mg 2.5 mg 5 mg 5 mg 2.5 mg 5 mg 5 mg    (5 mg1) (5 mg0.5) (5 mg1) (5 mg1) (5 mg0.5) (5 mg1) (5 mg1)         ANTICOAG SUMMARY: Anticoagulation Episode Summary              Current INR goal 2.0-3.0 Next INR check 05/17/2011   INR from last check 2.80 (04/19/2011)     Weekly max dose (mg)  Target end date Indefinite   Indications FACTOR V DEFICIENCY, Long term current use of anticoagulant   [facility] check [facility]    Send INR reminders to [facility]   Comments        Provider Role Specialty Phone number   Levada Schilling [facility]  Internal Medicine [phone]        ANTICOAG TODAY: Anticoagulation Summary as of 04/19/2011              INR goal 2.0-3.0     Selected INR 2.80 (04/19/2011) Next INR check 05/17/2011   Weekly max dose (mg)  Target end date Indefinite   Indications FACTOR V DEFICIENCY, Long term current use of anticoagulant    Anticoagulation Episode Summary              INR check location [facility] Preferred lab    Send INR reminders to [facility]   Comments        Provider Role Specialty Phone number   Levada Schilling [facility]  Internal Medicine [phone]        PATIENT INSTRUCTIONS: Patient Instructions  Patient instructed to take medications as defined in the Anti-coagulation Track section of this encounter.  Patient instructed to take  today's dose.  Patient verbalized understanding of these instructions.        FOLLOW-UP Return in 4 weeks (on 05/17/2011) for Follow up INR.  Hulen Luster, III Pharm.D.,  CACP

## 2011-04-19 NOTE — Patient Instructions (Signed)
Patient instructed to take medications as defined in the Anti-coagulation Track section of this encounter.  Patient instructed to take today's dose.  Patient verbalized understanding of these instructions.    

## 2011-04-21 ENCOUNTER — Encounter: Payer: Self-pay | Admitting: Internal Medicine

## 2011-05-04 ENCOUNTER — Other Ambulatory Visit: Payer: Self-pay | Admitting: Cardiology

## 2011-05-17 ENCOUNTER — Ambulatory Visit (INDEPENDENT_AMBULATORY_CARE_PROVIDER_SITE_OTHER): Payer: Medicare Other | Admitting: Pharmacist

## 2011-05-17 DIAGNOSIS — Z7901 Long term (current) use of anticoagulants: Secondary | ICD-10-CM

## 2011-05-17 DIAGNOSIS — Z86718 Personal history of other venous thrombosis and embolism: Secondary | ICD-10-CM

## 2011-05-17 DIAGNOSIS — I82409 Acute embolism and thrombosis of unspecified deep veins of unspecified lower extremity: Secondary | ICD-10-CM

## 2011-05-17 DIAGNOSIS — D682 Hereditary deficiency of other clotting factors: Secondary | ICD-10-CM

## 2011-05-17 LAB — POCT INR: INR: 3.2

## 2011-05-17 NOTE — Progress Notes (Signed)
Anti-Coagulation Progress Note  Judith [name] is a 75 y.o. female who is currently on an anti-coagulation regimen.    RECENT RESULTS: Recent results are below, the most recent result is correlated with a dose of 27.5 mg. per week: Lab Results  Component Value Date   INR 3.2 05/17/2011   INR 2.80 04/19/2011   INR 3.1 03/16/2011    ANTI-COAG DOSE:   Latest dosing instructions   Total Sun Mon Tue Wed Thu Fri Sat   25 2.5 mg 5 mg 2.5 mg 5 mg 2.5 mg 5 mg 2.5 mg    (5 mg0.5) (5 mg1) (5 mg0.5) (5 mg1) (5 mg0.5) (5 mg1) (5 mg0.5)         ANTICOAG SUMMARY: Anticoagulation Episode Summary              Current INR goal 2.0-3.0 Next INR check 06/21/2011   INR from last check 3.2! (05/17/2011)     Weekly max dose (mg)  Target end date Indefinite   Indications FACTOR V DEFICIENCY, Long term current use of anticoagulant   [facility] check location [facility]    Send INR reminders to [facility]   Comments        Provider Role Specialty Phone number   Levada Schilling [facility]  Internal Medicine [phone]        ANTICOAG TODAY: Anticoagulation Summary as of 05/17/2011              INR goal 2.0-3.0     Selected INR 3.2! (05/17/2011) Next INR check 06/21/2011   Weekly max dose (mg)  Target end date Indefinite   Indications FACTOR V DEFICIENCY, Long term current use of anticoagulant    Anticoagulation Episode Summary              INR check location [facility] Preferred [facility]    Send INR reminders to [facility]   Comments        Provider Role Specialty Phone number   Levada Schilling [facility]  Internal Medicine [phone]        PATIENT INSTRUCTIONS: Patient Instructions  Patient instructed to take medications as defined in the Anti-coagulation Track section of this encounter.  Patient instructed to take today's dose.  Patient verbalized understanding of these instructions.        FOLLOW-UP Return in 5 weeks (on 06/21/2011) for Follow up INR.  Hulen Luster,  III Pharm.D., CACP

## 2011-05-17 NOTE — Patient Instructions (Signed)
Patient instructed to take medications as defined in the Anti-coagulation Track section of this encounter.  Patient instructed to take today's dose.  Patient verbalized understanding of these instructions.    

## 2011-06-21 ENCOUNTER — Ambulatory Visit (INDEPENDENT_AMBULATORY_CARE_PROVIDER_SITE_OTHER): Payer: Medicare Other | Admitting: Pharmacist

## 2011-06-21 DIAGNOSIS — Z86718 Personal history of other venous thrombosis and embolism: Secondary | ICD-10-CM

## 2011-06-21 DIAGNOSIS — I82409 Acute embolism and thrombosis of unspecified deep veins of unspecified lower extremity: Secondary | ICD-10-CM

## 2011-06-21 DIAGNOSIS — Z7901 Long term (current) use of anticoagulants: Secondary | ICD-10-CM

## 2011-06-21 DIAGNOSIS — D682 Hereditary deficiency of other clotting factors: Secondary | ICD-10-CM

## 2011-06-21 LAB — POCT INR: INR: 2

## 2011-06-21 NOTE — Progress Notes (Signed)
Anti-Coagulation Progress Note  Judith [name] is a 75 y.o. female who is currently on an anti-coagulation regimen.    RECENT RESULTS: Recent results are below, the most recent result is correlated with a dose of 25 mg. per week: Lab Results  Component Value Date   INR 2.00 06/21/2011   INR 3.2 05/17/2011   INR 2.80 04/19/2011    ANTI-COAG DOSE:   Latest dosing instructions   Total Sun Mon Tue Wed Thu Fri Sat   27.5 2.5 mg 5 mg 5 mg 5 mg 2.5 mg 5 mg 2.5 mg    (5 mg0.5) (5 mg1) (5 mg1) (5 mg1) (5 mg0.5) (5 mg1) (5 mg0.5)         ANTICOAG SUMMARY: Anticoagulation Episode Summary              Current INR goal 2.0-3.0 Next INR check 07/19/2011   INR from last check 2.00 (06/21/2011)     Weekly max dose (mg)  Target end date Indefinite   Indications FACTOR V DEFICIENCY, Long term current use of anticoagulant   INR check location [facility] Pref[facility]    Send INR reminders to [facility] IMP   Comments        Provider Role Specialty Phone number   Levada Schilling [facility]  Internal Medicine [phone]        ANTICOAG TODAY: Anticoagulation Summary as of 06/21/2011              INR goal 2.0-3.0     Selected INR 2.00 (06/21/2011) Next INR check 07/19/2011   Weekly max dose (mg)  Target end date Indefinite   Indications FACTOR V DEFICIENCY, Long term current use of anticoagulant    Anticoagulation Episode Summary              INR check location [facility] Pref[facility]    Send INR reminders to [facility] IMP   Comments        Provider Role Specialty Phone number   Levada Schilling [facility]  Internal Medicine [phone]        PATIENT INSTRUCTIONS: Patient Instructions  Patient instructed to take medications as defined in the Anti-coagulation Track section of this encounter.  Patient instructed to take today's dose.  Patient verbalized understanding of these instructions.        FOLLOW-UP Return in 4 weeks (on 07/19/2011) for Follow up INR.  Hulen Luster,  III Pharm.D., CACP

## 2011-06-21 NOTE — Progress Notes (Signed)
Agree with the above

## 2011-06-21 NOTE — Patient Instructions (Signed)
Patient instructed to take medications as defined in the Anti-coagulation Track section of this encounter.  Patient instructed to take today's dose.  Patient verbalized understanding of these instructions.    

## 2011-07-19 ENCOUNTER — Ambulatory Visit (INDEPENDENT_AMBULATORY_CARE_PROVIDER_SITE_OTHER): Payer: Medicare Other | Admitting: Pharmacist

## 2011-07-19 ENCOUNTER — Other Ambulatory Visit: Payer: Self-pay | Admitting: Pharmacist

## 2011-07-19 DIAGNOSIS — Z7901 Long term (current) use of anticoagulants: Secondary | ICD-10-CM

## 2011-07-19 DIAGNOSIS — I82409 Acute embolism and thrombosis of unspecified deep veins of unspecified lower extremity: Secondary | ICD-10-CM

## 2011-07-19 DIAGNOSIS — Z86718 Personal history of other venous thrombosis and embolism: Secondary | ICD-10-CM

## 2011-07-19 DIAGNOSIS — D682 Hereditary deficiency of other clotting factors: Secondary | ICD-10-CM

## 2011-07-19 LAB — POCT INR: INR: 2.5

## 2011-07-19 MED ORDER — WARFARIN SODIUM 5 MG PO TABS: ORAL_TABLET | ORAL | Status: DC

## 2011-07-19 NOTE — Patient Instructions (Signed)
Patient instructed to take medications as defined in the Anti-coagulation Track section of this encounter.  Patient instructed to take today's dose.  Patient verbalized understanding of these instructions.    

## 2011-07-19 NOTE — Progress Notes (Signed)
Anti-Coagulation Progress Note  Judith [name] is a 74 y.o. female who is currently on an anti-coagulation regimen.    RECENT RESULTS: Recent results are below, the most recent result is correlated with a dose of 27.5 mg. per week: Lab Results  Component Value Date   INR 2.5 07/19/2011   INR 2.00 06/21/2011   INR 3.2 05/17/2011    ANTI-COAG DOSE:   Latest dosing instructions   Total Sun Mon Tue Wed Thu Fri Sat   27.5 2.5 mg 5 mg 5 mg 5 mg 2.5 mg 5 mg 2.5 mg    (5 mg0.5) (5 mg1) (5 mg1) (5 mg1) (5 mg0.5) (5 mg1) (5 mg0.5)         ANTICOAG SUMMARY: Anticoagulation Episode Summary              Current INR goal 2.0-3.0 Next INR check 08/16/2011   INR from last check 2.5 (07/19/2011)     Weekly max dose (mg)  Target end date Indefinite   Indications FACTOR V DEFICIENCY, Long term current use of anticoagulant   INR check location [facility]    Send INR reminders to [facility] IMP   Comments        Provider Role Specialty Phone number   Levada Schilling [facility]  Internal Medicine [phone]        ANTICOAG TODAY: Anticoagulation Summary as of 07/19/2011              INR goal 2.0-3.0     Selected INR 2.5 (07/19/2011) Next INR check 08/16/2011   Weekly max dose (mg)  Target end date Indefinite   Indications FACTOR V DEFICIENCY, Long term current use of anticoagulant    Anticoagulation Episode Summary              INR check location [facility]    Send INR reminders to [facility] IMP   Comments        Provider Role Specialty Phone number   Levada Schilling [facility]  Internal Medicine [phone]        PATIENT INSTRUCTIONS: Patient Instructions  Patient instructed to take medications as defined in the Anti-coagulation Track section of this encounter.  Patient instructed to take today's dose.  Patient verbalized understanding of these instructions.        FOLLOW-UP Return in 4 weeks (on 08/16/2011).  Hulen Luster, III Pharm.D., CACP

## 2011-07-27 ENCOUNTER — Other Ambulatory Visit: Payer: Self-pay | Admitting: *Deleted

## 2011-07-27 MED ORDER — AMLODIPINE BESYLATE 10 MG PO TABS: 10.0000 mg | ORAL_TABLET | Freq: Every day | ORAL | Status: DC

## 2011-08-16 ENCOUNTER — Ambulatory Visit (INDEPENDENT_AMBULATORY_CARE_PROVIDER_SITE_OTHER): Payer: Medicare Other | Admitting: Pharmacist

## 2011-08-16 DIAGNOSIS — Z86718 Personal history of other venous thrombosis and embolism: Secondary | ICD-10-CM

## 2011-08-16 DIAGNOSIS — I82409 Acute embolism and thrombosis of unspecified deep veins of unspecified lower extremity: Secondary | ICD-10-CM

## 2011-08-16 DIAGNOSIS — D682 Hereditary deficiency of other clotting factors: Secondary | ICD-10-CM

## 2011-08-16 DIAGNOSIS — Z7901 Long term (current) use of anticoagulants: Secondary | ICD-10-CM

## 2011-08-16 LAB — POCT INR: INR: 2.2

## 2011-08-16 NOTE — Progress Notes (Signed)
Dr. Sandy Salaam patient.  Will defer decision on duration of anticoagulation to her and Ms. Peral.

## 2011-08-16 NOTE — Progress Notes (Signed)
Anti-Coagulation Progress Note  Judith [name] is a 75 y.o. female who is currently on an anti-coagulation regimen.    RECENT RESULTS: Recent results are below, the most recent result is correlated with a dose of 27.5 mg. per week: Lab Results  Component Value Date   INR 2.2 08/16/2011   INR 2.5 07/19/2011   INR 2.00 06/21/2011    ANTI-COAG DOSE:   Latest dosing instructions   Total Sun Mon Tue Wed Thu Fri Sat   27.5 2.5 mg 5 mg 5 mg 5 mg 2.5 mg 5 mg 2.5 mg    (5 mg0.5) (5 mg1) (5 mg1) (5 mg1) (5 mg0.5) (5 mg1) (5 mg0.5)         ANTICOAG SUMMARY: Anticoagulation Episode Summary              Current INR goal 2.0-3.0 Next INR check 09/13/2011   INR from last check 2.2 (08/16/2011)     Weekly max dose (mg)  Target end date Indefinite   Indications FACTOR V DEFICIENCY, Long term current use of anticoagulant   INR check location [facility]    Send INR reminders to [facility] IMP   Comments        Provider Role Specialty Phone number   Levada Schilling [facility]  Internal Medicine [phone]        ANTICOAG TODAY: Anticoagulation Summary as of 08/16/2011              INR goal 2.0-3.0     Selected INR 2.2 (08/16/2011) Next INR check 09/13/2011   Weekly max dose (mg)  Target end date Indefinite   Indications FACTOR V DEFICIENCY, Long term current use of anticoagulant    Anticoagulation Episode Summary              INR check location [facility]    Send INR reminders to [facility]   Comments        Provider Role Specialty Phone number   Levada Schilling [facility]  Internal Medicine [phone]        PATIENT INSTRUCTIONS: Patient Instructions  Patient instructed to take medications as defined in the Anti-coagulation Track section of this encounter.  Patient instructed to take today's dose.  Patient verbalized understanding of these instructions.        FOLLOW-UP Return in 4 weeks (on 09/13/2011) for Follow up INR.  Hulen Luster,  III Pharm.D., CACP

## 2011-08-16 NOTE — Patient Instructions (Signed)
Patient instructed to take medications as defined in the Anti-coagulation Track section of this encounter.  Patient instructed to take today's dose.  Patient verbalized understanding of these instructions.    

## 2011-09-13 ENCOUNTER — Ambulatory Visit (INDEPENDENT_AMBULATORY_CARE_PROVIDER_SITE_OTHER): Payer: Medicare Other | Admitting: Pharmacist

## 2011-09-13 DIAGNOSIS — I82409 Acute embolism and thrombosis of unspecified deep veins of unspecified lower extremity: Secondary | ICD-10-CM

## 2011-09-13 DIAGNOSIS — Z7901 Long term (current) use of anticoagulants: Secondary | ICD-10-CM

## 2011-09-13 DIAGNOSIS — Z86718 Personal history of other venous thrombosis and embolism: Secondary | ICD-10-CM

## 2011-09-13 DIAGNOSIS — D682 Hereditary deficiency of other clotting factors: Secondary | ICD-10-CM

## 2011-09-13 LAB — POCT INR: INR: 2.4

## 2011-09-13 NOTE — Patient Instructions (Signed)
Anti-Coagulation Progress Note  Judith [name] is a 75 y.o. female who is currently on an anti-coagulation regimen.    RECENT RESULTS: Recent results are below, the most recent result is correlated with a dose of 27.5 mg. per week: Lab Results  Component Value Date   INR 2.40 09/13/2011   INR 2.2 08/16/2011   INR 2.5 07/19/2011    ANTI-COAG DOSE:   Latest dosing instructions   Total Sun Mon Tue Wed Thu Fri Sat   27.5 2.5 mg 5 mg 5 mg 5 mg 2.5 mg 5 mg 2.5 mg    (5 mg0.5) (5 mg1) (5 mg1) (5 mg1) (5 mg0.5) (5 mg1) (5 mg0.5)         ANTICOAG SUMMARY: @ANTICOAGSUMMARY @  ANTICOAG TODAY: @ANTICOAGTODAY @  PATIENT INSTRUCTIONS: @PATINSTR @   FOLLOW-UP No Follow-up on file.  Hulen Luster, III Pharm.D., CACP

## 2011-09-13 NOTE — Progress Notes (Signed)
Anti-Coagulation Progress Note  Judith [name] is a 75 y.o. female who is currently on an anti-coagulation regimen.    RECENT RESULTS: Recent results are below, the most recent result is correlated with a dose of 27.5 mg. per week: Lab Results  Component Value Date   INR 2.40 09/13/2011   INR 2.2 08/16/2011   INR 2.5 07/19/2011    ANTI-COAG DOSE:   Latest dosing instructions   Total Sun Mon Tue Wed Thu Fri Sat   27.5 2.5 mg 5 mg 5 mg 5 mg 2.5 mg 5 mg 2.5 mg    (5 mg0.5) (5 mg1) (5 mg1) (5 mg1) (5 mg0.5) (5 mg1) (5 mg0.5)         ANTICOAG SUMMARY: Anticoagulation Episode Summary              Current INR goal 2.0-3.0 Next INR check 09/27/2011   INR from last check 2.40 (09/13/2011)     Weekly max dose (mg)  Target end date Indefinite   Indications FACTOR V DEFICIENCY, Long term current use of anticoagulant   [facility]    Send INR reminders to [facility]   Comments Index VTE (DVT) was 2007. She was subsequently documented as having Leiden Factor V Deficiency. She has indicated a continued interest in remaining upon warfarin. Should re-visit this issue with patient annually. Currently "with the advice and consent of the patient--after review of risks vs. benefits--she elects to stay upon warfarin.       Provider Role Specialty Phone number   [facility]  Internal Medicine [phone]        ANTICOAG TODAY: Anticoagulation Summary as of 09/13/2011              INR goal 2.0-3.0     Selected INR 2.40 (09/13/2011) Next INR check 09/27/2011   Weekly max dose (mg)  Target end date Indefinite   Indications FACTOR V DEFICIENCY, Long term current use of anticoagulant    Anticoagulation Episode Summary              INR check location [facility]    Send INR reminders to [facility]   Comments Index VTE (DVT) was 2007. She was subsequently documented as having Leiden Factor V Deficiency. She has indicated a  continued interest in remaining upon warfarin. Should re-visit this issue with patient annually. Currently "with the advice and consent of the patient--after review of risks vs. benefits--she elects to stay upon warfarin.       Provider Role Specialty Phone number   [facility]  Internal Medicine [phone]        PATIENT INSTRUCTIONS: Patient Instructions   Anti-Coagulation Progress Note  Judith [name] is a 75 y.o. female who is currently on an anti-coagulation regimen.    RECENT RESULTS: Recent results are below, the most recent result is correlated with a dose of 27.5 mg. per week: Lab Results  Component Value Date   INR 2.40 09/13/2011   INR 2.2 08/16/2011   INR 2.5 07/19/2011    ANTI-COAG DOSE:   Latest dosing instructions   Total Sun Mon Tue Wed Thu Fri Sat   27.5 2.5 mg 5 mg 5 mg 5 mg 2.5 mg 5 mg 2.5 mg    (5 mg0.5) (5 mg1) (5 mg1) (5 mg1) (5 mg0.5) (5 mg1) (5 mg0.5)         ANTICOAG SUMMARY: @ANTICOAGSUMMARY@  ANTICOAG TODAY: @ANTICOAGTODAY@  PATIENT INSTRUCTIONS: @PATINSTR@     FOLLOW-UP No Follow-up on file.  Briley Sulton, III Pharm.D., CACP       FOLLOW-UP Return in 2 weeks (on 09/27/2011) for Follow up INR.  Reka Wist, III Pharm.D., CACP    

## 2011-09-21 ENCOUNTER — Encounter (HOSPITAL_COMMUNITY): Payer: Self-pay | Admitting: Gastroenterology

## 2011-09-22 ENCOUNTER — Encounter (HOSPITAL_COMMUNITY)
Admission: RE | Disposition: A | Discharge status: Home / Self Care | Payer: Self-pay | Source: Ambulatory Visit | Attending: Gastroenterology

## 2011-09-22 ENCOUNTER — Ambulatory Visit (HOSPITAL_COMMUNITY)
Admission: RE | Admit: 2011-09-22 | Discharge: 2011-09-22 | Disposition: A | Discharge status: Home / Self Care | Payer: Medicare Other | Source: Ambulatory Visit | Attending: Gastroenterology | Admitting: Gastroenterology

## 2011-09-22 ENCOUNTER — Encounter (HOSPITAL_COMMUNITY): Payer: Self-pay | Admitting: *Deleted

## 2011-09-22 DIAGNOSIS — K5900 Constipation, unspecified: Secondary | ICD-10-CM | POA: Insufficient documentation

## 2011-09-22 DIAGNOSIS — K644 Residual hemorrhoidal skin tags: Secondary | ICD-10-CM | POA: Insufficient documentation

## 2011-09-22 DIAGNOSIS — K625 Hemorrhage of anus and rectum: Secondary | ICD-10-CM | POA: Insufficient documentation

## 2011-09-22 HISTORY — PX: COLONOSCOPY: SHX5424

## 2011-09-22 HISTORY — DX: Deep phlebothrombosis in pregnancy, unspecified trimester: O22.30

## 2011-09-22 SURGERY — COLONOSCOPY
Anesthesia: Moderate Sedation

## 2011-09-22 MED ORDER — MIDAZOLAM HCL 10 MG/2ML IJ SOLN
INTRAMUSCULAR | Status: AC
  Filled 2011-09-22: qty 4

## 2011-09-22 MED ORDER — SODIUM CHLORIDE 0.9 % IV SOLN: Freq: Once | INTRAVENOUS | Status: DC

## 2011-09-22 MED ORDER — FENTANYL CITRATE 0.05 MG/ML IJ SOLN
INTRAMUSCULAR | Status: AC
  Filled 2011-09-22: qty 4

## 2011-09-22 MED ORDER — FENTANYL NICU IV SYRINGE 50 MCG/ML
INJECTION | INTRAMUSCULAR | Status: DC | PRN
  Administered 2011-09-22 (×2): 10 ug via INTRAVENOUS
  Administered 2011-09-22: 20 ug via INTRAVENOUS
  Administered 2011-09-22: 25 ug via INTRAVENOUS

## 2011-09-22 MED ORDER — MIDAZOLAM HCL 5 MG/5ML IJ SOLN
INTRAMUSCULAR | Status: DC | PRN
  Administered 2011-09-22 (×3): 1 mg via INTRAVENOUS
  Administered 2011-09-22: 2 mg via INTRAVENOUS
  Administered 2011-09-22 (×2): 1 mg via INTRAVENOUS

## 2011-09-22 MED ORDER — DIPHENHYDRAMINE HCL 50 MG/ML IJ SOLN
INTRAMUSCULAR | Status: AC
  Filled 2011-09-22: qty 1

## 2011-09-22 NOTE — Interval H&P Note (Signed)
History and Physical Interval Note:   09/22/2011   7:47 AM   Judith [name]  has presented today for a colonoscopy for colorectal cancer screening. The various methods of treatment have been discussed with the patient. After consideration of risks, benefits and other options for treatment, the patient has consented to  Procedure(s): COLONOSCOPY as a surgical intervention .  The patients' history has been reviewed, patient examined, no change in status, stable for surgery.  I have reviewed the patients' chart and labs.  Questions were answered to the patient's satisfaction.     Izyk Marty  MD

## 2011-09-22 NOTE — OR Nursing (Signed)
Late entry--during procedure pt groaning, writhing in pain, more sedation ordered to give. After few additional doses pt became well sedated and sats started dropping, up O2 to 4 L and then to 8L when my opinion is that pt stopped breathing for a few seconds, face/skin was clammy. i performed jaw lift and started calling pts name, tech was shaking pts shoulder. Then advancement of scope stimulated pt and pt awakened slightly and sats up to 96%. Back to 4L by end of the procedure. VS stable through rest of procedure and in recovery.

## 2011-09-22 NOTE — H&P (View-Only) (Signed)
Anti-Coagulation Progress Note  Judith [name] is a 75 y.o. female who is currently on an anti-coagulation regimen.    RECENT RESULTS: Recent results are below, the most recent result is correlated with a dose of 27.5 mg. per week: Lab Results  Component Value Date   INR 2.40 09/13/2011   INR 2.2 08/16/2011   INR 2.5 07/19/2011    ANTI-COAG DOSE:   Latest dosing instructions   Total Sun Mon Tue Wed Thu Fri Sat   27.5 2.5 mg 5 mg 5 mg 5 mg 2.5 mg 5 mg 2.5 mg    (5 mg0.5) (5 mg1) (5 mg1) (5 mg1) (5 mg0.5) (5 mg1) (5 mg0.5)         ANTICOAG SUMMARY: Anticoagulation Episode Summary              Current INR goal 2.0-3.0 Next INR check 09/27/2011   INR from last check 2.40 (09/13/2011)     Weekly max dose (mg)  Target end date Indefinite   Indications FACTOR V DEFICIENCY, Long term current use of anticoagulant   [facility]    Send INR reminders to ANTICOAG IMP   Comments Index VTE (DVT) was 2007. She was subsequently documented as having Leiden Factor V Deficiency. She has indicated a continued interest in remaining upon warfarin. Should re-visit this issue with patient annually. Currently "with the advice and consent of the patient--after review of risks vs. benefits--she elects to stay upon warfarin.       Provider Role Specialty Phone number   Levada Schilling [facility]  Internal Medicine [phone]        ANTICOAG TODAY: Anticoagulation Summary as of 09/13/2011              INR goal 2.0-3.0     Selected INR 2.40 (09/13/2011) Next INR check 09/27/2011   Weekly max dose (mg)  Target end date Indefinite   Indications FACTOR V DEFICIENCY, Long term current use of anticoagulant    Anticoagulation Episode Summary              [facility]    Send INR reminders to ANTICOAG IMP   Comments Index VTE (DVT) was 2007. She was subsequently documented as having Leiden Factor V Deficiency. She has indicated a  continued interest in remaining upon warfarin. Should re-visit this issue with patient annually. Currently "with the advice and consent of the patient--after review of risks vs. benefits--she elects to stay upon warfarin.       Provider Role Specialty Phone number   Levada Schilling [facility]  Internal Medicine [phone]        PATIENT INSTRUCTIONS: Patient Instructions   Anti-Coagulation Progress Note  Judith [name] is a 75 y.o. female who is currently on an anti-coagulation regimen.    RECENT RESULTS: Recent results are below, the most recent result is correlated with a dose of 27.5 mg. per week: Lab Results  Component Value Date   INR 2.40 09/13/2011   INR 2.2 08/16/2011   INR 2.5 07/19/2011    ANTI-COAG DOSE:   Latest dosing instructions   Total Sun Mon Tue Wed Thu Fri Sat   27.5 2.5 mg 5 mg 5 mg 5 mg 2.5 mg 5 mg 2.5 mg    (5 mg0.5) (5 mg1) (5 mg1) (5 mg1) (5 mg0.5) (5 mg1) (5 mg0.5)         ANTICOAG SUMMARY: @ANTICOAGSUMMARY @  ANTICOAG TODAY: @ANTICOAGTODAY @  PATIENT INSTRUCTIONS: @PATINSTR @  FOLLOW-UP No Follow-up on file.  Hulen Luster, III Pharm.D., CACP       FOLLOW-UP Return in 2 weeks (on 09/27/2011) for Follow up INR.  Hulen Luster, III Pharm.D., CACP

## 2011-09-23 ENCOUNTER — Encounter (HOSPITAL_COMMUNITY): Payer: Self-pay

## 2011-09-27 ENCOUNTER — Ambulatory Visit (INDEPENDENT_AMBULATORY_CARE_PROVIDER_SITE_OTHER): Payer: Medicare Other | Admitting: Pharmacist

## 2011-09-27 DIAGNOSIS — D682 Hereditary deficiency of other clotting factors: Secondary | ICD-10-CM

## 2011-09-27 DIAGNOSIS — Z86718 Personal history of other venous thrombosis and embolism: Secondary | ICD-10-CM

## 2011-09-27 DIAGNOSIS — I82409 Acute embolism and thrombosis of unspecified deep veins of unspecified lower extremity: Secondary | ICD-10-CM

## 2011-09-27 DIAGNOSIS — Z7901 Long term (current) use of anticoagulants: Secondary | ICD-10-CM

## 2011-09-27 LAB — POCT INR: INR: 1.2

## 2011-09-27 NOTE — Patient Instructions (Signed)
Patient instructed to take medications as defined in the Anti-coagulation Track section of this encounter.  Patient instructed to take today's dose.  Patient verbalized understanding of these instructions.    

## 2011-09-27 NOTE — Progress Notes (Signed)
Anti-Coagulation Progress Note  Judith [name] is a 74 y.o. female who is currently on an anti-coagulation regimen.    RECENT RESULTS: Recent results are below, the most recent result is correlated with a dose of 27.5 mg. per week: Lab Results  Component Value Date   INR 1.2 09/27/2011   INR 2.40 09/13/2011   INR 2.2 08/16/2011    ANTI-COAG DOSE:   Latest dosing instructions   Total Sun Mon Tue Wed Thu Fri Sat   30 2.5 mg 5 mg 5 mg 5 mg 5 mg 5 mg 2.5 mg    (5 mg0.5) (5 mg1) (5 mg1) (5 mg1) (5 mg1) (5 mg1) (5 mg0.5)         ANTICOAG SUMMARY: Anticoagulation Episode Summary              Current INR goal 2.0-3.0 Next INR check 10/04/2011   INR from last check 1.2! (09/27/2011)     Weekly max dose (mg)  Target end date Indefinite   Indications FACTOR V DEFICIENCY, Long term current use of anticoagulant   [facility]    Send INR reminders to ANTICOAG IMP   Comments Index VTE (DVT) was 2007. She was subsequently documented as having Leiden Factor V Deficiency. She has indicated a continued interest in remaining upon warfarin. Should re-visit this issue with patient annually. Currently "with the advice and consent of the patient--after review of risks vs. benefits--she elects to stay upon warfarin.       Provider Role Specialty Phone number   Levada Schilling [facility]  Internal Medicine [phone]        ANTICOAG TODAY: Anticoagulation Summary as of 09/27/2011              INR goal 2.0-3.0     Selected INR 1.2! (09/27/2011) Next INR check 10/04/2011   Weekly max dose (mg)  Target end date Indefinite   Indications FACTOR V DEFICIENCY, Long term current use of anticoagulant    Anticoagulation Episode Summary              [facility] check [facility]    Send INR reminders to ANTICOAG IMP   Comments Index VTE (DVT) was 2007. She was subsequently documented as having Leiden Factor V Deficiency. She has indicated a  continued interest in remaining upon warfarin. Should re-visit this issue with patient annually. Currently "with the advice and consent of the patient--after review of risks vs. benefits--she elects to stay upon warfarin.       Provider Role Specialty Phone number   Levada Schilling [facility]  Internal Medicine [phone]        PATIENT INSTRUCTIONS: Patient Instructions  Patient instructed to take medications as defined in the Anti-coagulation Track section of this encounter.  Patient instructed to take today's dose.  Patient verbalized understanding of these instructions.        FOLLOW-UP Return in 7 days (on 10/04/2011) for Follow up INR.  Hulen Luster, III Pharm.D., CACP

## 2011-10-04 ENCOUNTER — Encounter (HOSPITAL_COMMUNITY): Payer: Self-pay | Admitting: Gastroenterology

## 2011-10-04 ENCOUNTER — Ambulatory Visit (INDEPENDENT_AMBULATORY_CARE_PROVIDER_SITE_OTHER): Payer: Medicare Other | Admitting: Pharmacist

## 2011-10-04 DIAGNOSIS — Z7901 Long term (current) use of anticoagulants: Secondary | ICD-10-CM

## 2011-10-04 DIAGNOSIS — Z86718 Personal history of other venous thrombosis and embolism: Secondary | ICD-10-CM

## 2011-10-04 DIAGNOSIS — I82409 Acute embolism and thrombosis of unspecified deep veins of unspecified lower extremity: Secondary | ICD-10-CM

## 2011-10-04 DIAGNOSIS — D682 Hereditary deficiency of other clotting factors: Secondary | ICD-10-CM

## 2011-10-04 LAB — POCT INR: INR: 1.4

## 2011-10-04 NOTE — Patient Instructions (Signed)
Patient instructed to take medications as defined in the Anti-coagulation Track section of this encounter.  Patient instructed to take today's dose.  Patient verbalized understanding of these instructions.    

## 2011-10-04 NOTE — Progress Notes (Signed)
Anti-Coagulation Progress Note  Judith [name] is a 75 y.o. female who is currently on an anti-coagulation regimen.    RECENT RESULTS: Recent results are below, the most recent result is correlated with a dose of 30 mg. per week: Lab Results  Component Value Date   INR 1.40 10/04/2011   INR 1.2 09/27/2011   INR 2.40 09/13/2011    ANTI-COAG DOSE:   Latest dosing instructions   Total Sun Mon Tue Wed Thu Fri Sat   40 5 mg 7.5 mg 5 mg 5 mg 7.5 mg 5 mg 5 mg    (5 mg1) (5 mg1.5) (5 mg1) (5 mg1) (5 mg1.5) (5 mg1) (5 mg1)         ANTICOAG SUMMARY: Anticoagulation Episode Summary              Current INR goal 2.0-3.0 Next INR check 10/18/2011   INR from last check 1.40! (10/04/2011)     Weekly max dose (mg)  Target end date Indefinite   Indications FACTOR V DEFICIENCY, Long term current use of anticoagulant   [facility]    Send INR reminders to ANTICOAG IMP   Comments Index VTE (DVT) was 2007. She was subsequently documented as having Leiden Factor V Deficiency. She has indicated a continued interest in remaining upon warfarin. Should re-visit this issue with patient annually. Currently "with the advice and consent of the patient--after review of risks vs. benefits--she elects to stay upon warfarin.       Provider Role Specialty Phone number   Levada Schilling [facility]  Internal Medicine [phone]        ANTICOAG TODAY: Anticoagulation Summary as of 10/04/2011              INR goal 2.0-3.0     Selected INR 1.40! (10/04/2011) Next INR check 10/18/2011   Weekly max dose (mg)  Target end date Indefinite   Indications FACTOR V DEFICIENCY, Long term current use of anticoagulant    Anticoagulation Episode Summary              [facility] check [facility]    Send INR reminders to ANTICOAG IMP   Comments Index VTE (DVT) was 2007. She was subsequently documented as having Leiden Factor V Deficiency. She has indicated a  continued interest in remaining upon warfarin. Should re-visit this issue with patient annually. Currently "with the advice and consent of the patient--after review of risks vs. benefits--she elects to stay upon warfarin.       Provider Role Specialty Phone number   Levada Schilling [facility]  Internal Medicine [phone]        PATIENT INSTRUCTIONS: Patient Instructions  Patient instructed to take medications as defined in the Anti-coagulation Track section of this encounter.  Patient instructed to take today's dose.  Patient verbalized understanding of these instructions.        FOLLOW-UP Return in 2 weeks (on 10/18/2011) for Follow up INR.  Hulen Luster, III Pharm.D., CACP

## 2011-10-18 ENCOUNTER — Ambulatory Visit (INDEPENDENT_AMBULATORY_CARE_PROVIDER_SITE_OTHER): Payer: Medicare Other | Admitting: Pharmacist

## 2011-10-18 DIAGNOSIS — Z7901 Long term (current) use of anticoagulants: Secondary | ICD-10-CM

## 2011-10-18 DIAGNOSIS — D682 Hereditary deficiency of other clotting factors: Secondary | ICD-10-CM

## 2011-10-18 DIAGNOSIS — I82409 Acute embolism and thrombosis of unspecified deep veins of unspecified lower extremity: Secondary | ICD-10-CM

## 2011-10-18 DIAGNOSIS — Z86718 Personal history of other venous thrombosis and embolism: Secondary | ICD-10-CM

## 2011-10-18 LAB — POCT INR: INR: 4.8

## 2011-10-18 NOTE — Patient Instructions (Signed)
Patient instructed to take medications as defined in the Anti-coagulation Track section of this encounter.  Patient instructed to OMIT today's dose.  Patient verbalized understanding of these instructions.    

## 2011-10-18 NOTE — Progress Notes (Signed)
Anti-Coagulation Progress Note  Judith [name] is a 75 y.o. female who is currently on an anti-coagulation regimen.    RECENT RESULTS: Recent results are below, the most recent result is correlated with a dose of 40 mg. per week: Lab Results  Component Value Date   INR 4.80 10/18/2011   INR 1.40 10/04/2011   INR 1.2 09/27/2011    ANTI-COAG DOSE:   Latest dosing instructions   Total Sun Mon Tue Wed Thu Fri Sat   35 5 mg 5 mg 5 mg 5 mg 5 mg 5 mg 5 mg    (5 mg1) (5 mg1) (5 mg1) (5 mg1) (5 mg1) (5 mg1) (5 mg1)         ANTICOAG SUMMARY: Anticoagulation Episode Summary              Current INR goal 2.0-3.0 Next INR check 10/25/2011   INR from last check 4.80! (10/18/2011)     Weekly max dose (mg)  Target end date Indefinite   Indications FACTOR V DEFICIENCY, Long term current use of anticoagulant   [facility]    Send INR reminders to ANTICOAG IMP   Comments Index VTE (DVT) was 2007. She was subsequently documented as having Leiden Factor V Deficiency. She has indicated a continued interest in remaining upon warfarin. Should re-visit this issue with patient annually. Currently "with the advice and consent of the patient--after review of risks vs. benefits--she elects to stay upon warfarin.       Provider Role Specialty Phone number   Levada Schilling [facility]  Internal Medicine [phone]        ANTICOAG TODAY: Anticoagulation Summary as of 10/18/2011              INR goal 2.0-3.0     Selected INR 4.80! (10/18/2011) Next INR check 10/25/2011   Weekly max dose (mg)  Target end date Indefinite   Indications FACTOR V DEFICIENCY, Long term current use of anticoagulant    Anticoagulation Episode Summary              [facility] check location [facility]    Send INR reminders to ANTICOAG IMP   Comments Index VTE (DVT) was 2007. She was subsequently documented as having Leiden Factor V Deficiency. She has indicated a continued  interest in remaining upon warfarin. Should re-visit this issue with patient annually. Currently "with the advice and consent of the patient--after review of risks vs. benefits--she elects to stay upon warfarin.       Provider Role Specialty Phone number   Levada Schilling [facility]  Internal Medicine [phone]        PATIENT INSTRUCTIONS: Patient Instructions  Patient instructed to take medications as defined in the Anti-coagulation Track section of this encounter.  Patient instructed to OMIT today's dose.  Patient verbalized understanding of these instructions.        FOLLOW-UP Return in 7 days (on 10/25/2011) for Follow up INR.  Hulen Luster, III Pharm.D., CACP

## 2011-10-25 ENCOUNTER — Ambulatory Visit (INDEPENDENT_AMBULATORY_CARE_PROVIDER_SITE_OTHER): Payer: Medicare Other | Admitting: Pharmacist

## 2011-10-25 DIAGNOSIS — D682 Hereditary deficiency of other clotting factors: Secondary | ICD-10-CM

## 2011-10-25 DIAGNOSIS — Z7901 Long term (current) use of anticoagulants: Secondary | ICD-10-CM

## 2011-10-25 DIAGNOSIS — I82409 Acute embolism and thrombosis of unspecified deep veins of unspecified lower extremity: Secondary | ICD-10-CM

## 2011-10-25 DIAGNOSIS — Z86718 Personal history of other venous thrombosis and embolism: Secondary | ICD-10-CM

## 2011-10-25 LAB — POCT INR: INR: 4.9

## 2011-10-25 NOTE — Patient Instructions (Signed)
Patient instructed to take medications as defined in the Anti-coagulation Track section of this encounter.  Patient instructed to OMIT/HOLD today's dose.  Patient verbalized understanding of these instructions.    

## 2011-10-25 NOTE — Progress Notes (Signed)
Anti-Coagulation Progress Note  Judith [name] is a 75 y.o. female who is currently on an anti-coagulation regimen.    RECENT RESULTS: Recent results are below, the most recent result is correlated with a dose of 35 mg. per week: Lab Results  Component Value Date   INR 4.90 10/25/2011   INR 4.80 10/18/2011   INR 1.40 10/04/2011    ANTI-COAG DOSE:   Latest dosing instructions   Total Sun Mon Tue Wed Thu Fri Sat   30 5 mg 5 mg 2.5 mg 5 mg 5 mg 2.5 mg 5 mg    (5 mg1) (5 mg1) (5 mg0.5) (5 mg1) (5 mg1) (5 mg0.5) (5 mg1)         ANTICOAG SUMMARY: Anticoagulation Episode Summary              Current INR goal 2.0-3.0 Next INR check 10/25/2011   INR from last check 4.90! (10/25/2011)     Weekly max dose (mg)  Target end date Indefinite   Indications FACTOR V DEFICIENCY, Long term current use of anticoagulant   [facility]    Send INR reminders to ANTICOAG IMP   Comments Index VTE (DVT) was 2007. She was subsequently documented as having Leiden Factor V Deficiency. She has indicated a continued interest in remaining upon warfarin. Should re-visit this issue with patient annually. Currently "with the advice and consent of the patient--after review of risks vs. benefits--she elects to stay upon warfarin.       Provider Role Specialty Phone number   Levada Schilling [facility]  Internal Medicine [phone]        ANTICOAG TODAY: Anticoagulation Summary as of 10/25/2011              INR goal 2.0-3.0     Selected INR 4.90! (10/25/2011) Next INR check    Weekly max dose (mg)  Target end date Indefinite   Indications FACTOR V DEFICIENCY, Long term current use of anticoagulant    Anticoagulation Episode Summary              IN[facility] check location [facility]    Send INR reminders to ANTICOAG IMP   Comments Index VTE (DVT) was 2007. She was subsequently documented as having Leiden Factor V Deficiency. She has indicated a continued  interest in remaining upon warfarin. Should re-visit this issue with patient annually. Currently "with the advice and consent of the patient--after review of risks vs. benefits--she elects to stay upon warfarin.       Provider Role Specialty Phone number   Levada Schilling [facility]  Internal Medicine [phone]        PATIENT INSTRUCTIONS: Patient Instructions  Patient instructed to take medications as defined in the Anti-coagulation Track section of this encounter.  Patient instructed to OMIT/HOLD today's dose.  Patient verbalized understanding of these instructions.        FOLLOW-UP Return in 3 weeks (on 11/15/2011).  Hulen Luster, III Pharm.D., CACP

## 2011-11-15 ENCOUNTER — Ambulatory Visit (INDEPENDENT_AMBULATORY_CARE_PROVIDER_SITE_OTHER): Payer: Medicare Other | Admitting: Pharmacist

## 2011-11-15 ENCOUNTER — Other Ambulatory Visit: Payer: Self-pay | Admitting: Internal Medicine

## 2011-11-15 DIAGNOSIS — Z86718 Personal history of other venous thrombosis and embolism: Secondary | ICD-10-CM | POA: Diagnosis not present

## 2011-11-15 DIAGNOSIS — D682 Hereditary deficiency of other clotting factors: Secondary | ICD-10-CM | POA: Diagnosis not present

## 2011-11-15 DIAGNOSIS — Z7901 Long term (current) use of anticoagulants: Secondary | ICD-10-CM

## 2011-11-15 DIAGNOSIS — I82409 Acute embolism and thrombosis of unspecified deep veins of unspecified lower extremity: Secondary | ICD-10-CM | POA: Diagnosis not present

## 2011-11-15 LAB — POCT INR: INR: 2.6

## 2011-11-15 MED ORDER — WARFARIN SODIUM 5 MG PO TABS: ORAL_TABLET | ORAL | Status: DC

## 2011-11-15 NOTE — Progress Notes (Signed)
Anti-Coagulation Progress Note  Judith [name] is a 76 y.o. female who is currently on an anti-coagulation regimen.    RECENT RESULTS: Recent results are below, the most recent result is correlated with a dose of 30 mg. per week: Lab Results  Component Value Date   INR 2.60 11/15/2011   INR 4.90 10/25/2011   INR 4.80 10/18/2011    ANTI-COAG DOSE:   Latest dosing instructions   Total Sun Mon Tue Wed Thu Fri Sat   30 5 mg 5 mg 2.5 mg 5 mg 5 mg 2.5 mg 5 mg    (5 mg1) (5 mg1) (5 mg0.5) (5 mg1) (5 mg1) (5 mg0.5) (5 mg1)         ANTICOAG SUMMARY: Anticoagulation Episode Summary              Current INR goal 2.0-3.0 Next INR check 12/13/2011   INR from last check 2.60 (11/15/2011)     Weekly max dose (mg)  Target end date Indefinite   Indications FACTOR V DEFICIENCY, Long term current use of anticoagulant   [facility] check [facility]    Send INR reminders to ANTICOAG IMP   Comments Index VTE (DVT) was 2007. She was subsequently documented as having Leiden Factor V Deficiency. She has indicated a continued interest in remaining upon warfarin. Should re-visit this issue with patient annually. Currently "with the advice and consent of the patient--after review of risks vs. benefits--she elects to stay upon warfarin.       Provider Role Specialty Phone number   Levada Schilling [facility]  Internal Medicine [phone]        ANTICOAG TODAY: Anticoagulation Summary as of 11/15/2011              INR goal 2.0-3.0     Selected INR 2.60 (11/15/2011) Next INR check 12/13/2011   Weekly max dose (mg)  Target end date Indefinite   Indications FACTOR V DEFICIENCY, Long term current use of anticoagulant    Anticoagulation Episode Summary              [facility] check location [facility]    Send INR reminders to ANTICOAG IMP   Comments Index VTE (DVT) was 2007. She was subsequently documented as having Leiden Factor V Deficiency. She has indicated a continued interest  in remaining upon warfarin. Should re-visit this issue with patient annually. Currently "with the advice and consent of the patient--after review of risks vs. benefits--she elects to stay upon warfarin.       Provider Role Specialty Phone number   Levada Schilling [facility]  Internal Medicine [phone]        PATIENT INSTRUCTIONS: Patient Instructions  Patient instructed to take medications as defined in the Anti-coagulation Track section of this encounter.  Patient instructed to take today's dose.  Patient verbalized understanding of these instructions.        FOLLOW-UP Return in 4 weeks (on 12/13/2011) for Follow up INR.  Hulen Luster, III Pharm.D., CACP

## 2011-11-15 NOTE — Patient Instructions (Signed)
Patient instructed to take medications as defined in the Anti-coagulation Track section of this encounter.  Patient instructed to take today's dose.  Patient verbalized understanding of these instructions.    

## 2011-11-16 ENCOUNTER — Ambulatory Visit: Payer: Medicare Other | Admitting: Internal Medicine

## 2011-11-19 DIAGNOSIS — Z1231 Encounter for screening mammogram for malignant neoplasm of breast: Secondary | ICD-10-CM | POA: Diagnosis not present

## 2011-11-22 ENCOUNTER — Encounter: Payer: Self-pay | Admitting: Gynecology

## 2011-11-30 ENCOUNTER — Ambulatory Visit: Payer: Medicare Other | Admitting: Internal Medicine

## 2011-12-13 ENCOUNTER — Ambulatory Visit: Payer: PRIVATE HEALTH INSURANCE

## 2011-12-13 ENCOUNTER — Other Ambulatory Visit: Payer: Self-pay | Admitting: Internal Medicine

## 2011-12-13 ENCOUNTER — Ambulatory Visit: Payer: PRIVATE HEALTH INSURANCE | Admitting: Gynecology

## 2011-12-15 ENCOUNTER — Emergency Department (HOSPITAL_COMMUNITY)
Admission: EM | Admit: 2011-12-15 | Discharge: 2011-12-15 | Disposition: A | Discharge status: Home / Self Care | Payer: Medicare Other | Attending: Emergency Medicine | Admitting: Emergency Medicine

## 2011-12-15 ENCOUNTER — Encounter (HOSPITAL_COMMUNITY): Payer: Self-pay | Admitting: *Deleted

## 2011-12-15 ENCOUNTER — Emergency Department (HOSPITAL_COMMUNITY): Payer: Medicare Other

## 2011-12-15 DIAGNOSIS — M810 Age-related osteoporosis without current pathological fracture: Secondary | ICD-10-CM | POA: Insufficient documentation

## 2011-12-15 DIAGNOSIS — Z86711 Personal history of pulmonary embolism: Secondary | ICD-10-CM | POA: Insufficient documentation

## 2011-12-15 DIAGNOSIS — R05 Cough: Secondary | ICD-10-CM | POA: Insufficient documentation

## 2011-12-15 DIAGNOSIS — R0602 Shortness of breath: Secondary | ICD-10-CM | POA: Diagnosis not present

## 2011-12-15 DIAGNOSIS — R062 Wheezing: Secondary | ICD-10-CM | POA: Diagnosis not present

## 2011-12-15 DIAGNOSIS — J40 Bronchitis, not specified as acute or chronic: Secondary | ICD-10-CM | POA: Diagnosis not present

## 2011-12-15 DIAGNOSIS — K219 Gastro-esophageal reflux disease without esophagitis: Secondary | ICD-10-CM | POA: Insufficient documentation

## 2011-12-15 DIAGNOSIS — I10 Essential (primary) hypertension: Secondary | ICD-10-CM | POA: Insufficient documentation

## 2011-12-15 DIAGNOSIS — I2789 Other specified pulmonary heart diseases: Secondary | ICD-10-CM | POA: Diagnosis not present

## 2011-12-15 DIAGNOSIS — R059 Cough, unspecified: Secondary | ICD-10-CM | POA: Insufficient documentation

## 2011-12-15 DIAGNOSIS — Z86718 Personal history of other venous thrombosis and embolism: Secondary | ICD-10-CM | POA: Insufficient documentation

## 2011-12-15 DIAGNOSIS — E785 Hyperlipidemia, unspecified: Secondary | ICD-10-CM | POA: Diagnosis not present

## 2011-12-15 DIAGNOSIS — J438 Other emphysema: Secondary | ICD-10-CM | POA: Diagnosis not present

## 2011-12-15 DIAGNOSIS — J209 Acute bronchitis, unspecified: Secondary | ICD-10-CM | POA: Diagnosis not present

## 2011-12-15 DIAGNOSIS — I2699 Other pulmonary embolism without acute cor pulmonale: Secondary | ICD-10-CM | POA: Diagnosis not present

## 2011-12-15 MED ORDER — ALBUTEROL SULFATE HFA 108 (90 BASE) MCG/ACT IN AERS: 2.0000 | INHALATION_SPRAY | RESPIRATORY_TRACT | Status: DC | PRN

## 2011-12-15 MED ORDER — PREDNISONE 20 MG PO TABS
60.0000 mg | ORAL_TABLET | Freq: Once | ORAL | Status: AC
  Administered 2011-12-15: 60 mg via ORAL
  Filled 2011-12-15: qty 2
  Filled 2011-12-15: qty 1

## 2011-12-15 MED ORDER — PREDNISONE 10 MG PO TABS: 20.0000 mg | ORAL_TABLET | Freq: Every day | ORAL | Status: DC

## 2011-12-15 MED ORDER — IPRATROPIUM BROMIDE 0.02 % IN SOLN
0.5000 mg | Freq: Once | RESPIRATORY_TRACT | Status: AC
  Administered 2011-12-15: 0.5 mg via RESPIRATORY_TRACT
  Filled 2011-12-15: qty 2.5

## 2011-12-15 MED ORDER — ALBUTEROL SULFATE (5 MG/ML) 0.5% IN NEBU
5.0000 mg | INHALATION_SOLUTION | Freq: Once | RESPIRATORY_TRACT | Status: AC
  Administered 2011-12-15: 5 mg via RESPIRATORY_TRACT
  Filled 2011-12-15: qty 1

## 2011-12-15 NOTE — ED Provider Notes (Signed)
History     CSN: [id number]  Arrival date & time 12/15/11  6578   First MD Initiated Contact with Patient 12/15/11 1100      Chief Complaint  Patient presents with  . Cough  . Shortness of Breath    (Consider location/radiation/quality/duration/timing/severity/associated sxs/prior treatment) The history is provided by the patient.   Patient here with cough x5 days which has been nonproductive. No associated fever, vomiting, diarrhea. Was seen at urgent care Center yesterday diagnosed with bronchitis and prescribed antibiotics which she has not started yet. Denies any chest pain or lower extremity edema. She has noted some wheezing. No medications taken for this prior to arrival. Nothing makes the symptoms better or worse Past Medical History  Diagnosis Date  . Hypertension   . Hyperlipidemia   . Pulmonary embolus 2007    and DVT, on chronic anti-coagulation  . Shortness of breath 2011    Thought to be secondary to weight and deconditioning, negative cardiac and pulmonary evaluation: Lexiscan ETT, Echo  (EF nl., +LVH),-CXR, nl PFT's  . Osteoporosis   . Cancer 1983, 2006    , s/p XRT, partial hysterectomy and coloscopy, then upper vaginectomy  . Stress incontinence, female   . Chronic sinusitis   . Stress incontinence, female   . Blood dyscrasia     Factor 5 Leiden  . DVT (deep vein thrombosis) in pregnancy     Both lower extremities  . Pulmonary embolism   . Acid reflux     Past Surgical History  Procedure Date  . Cleft palate repair   . Nasal septum repair   . Tubal ligation   . Hammer toe surgery 1990-91    3 toes each foot  . Cervix removal   . Colonoscopy 09/22/2011    Procedure: COLONOSCOPY;  Surgeon: Charna Elizabeth, MD;  Location: [facility];  Service: Endoscopy;  Laterality: N/A;    Family History  Problem Relation Age of Onset  . Heart disease Sister   . Atrial fibrillation Sister   . Hypertension Sister   . Cancer Maternal Aunt     History    Substance Use Topics  . Smoking status: Never Smoker   . Smokeless tobacco: Not on file  . Alcohol Use: No    OB History    Grav Para Term Preterm Abortions TAB SAB Ect Mult Living                  Review of Systems  All other systems reviewed and are negative.    Allergies  Penicillins and Sulfonamide derivatives  Home Medications   Current Outpatient Rx  Name Route Sig Dispense Refill  . ALENDRONATE SODIUM 70 MG PO TABS Oral Take 70 mg by mouth once a week. Sundays    . AMLODIPINE BESYLATE 10 MG PO TABS Oral Take 1 tablet (10 mg total) by mouth daily. 31 tablet 5  . CALTRATE 600+D PLUS PO Oral Take 600 mg by mouth every other day.     Marland Kitchen VITAMIN D 1000 UNITS PO TABS Oral Take 1,000 Units by mouth daily.     Marland Kitchen FAMOTIDINE 20 MG PO TABS Oral Take 20 mg by mouth daily.    Marland Kitchen MOVE FREE PO Oral Take 1 tablet by mouth daily.     Marland Kitchen LISINOPRIL 20 MG PO TABS  TAKE 1 TABLET BY MOUTH EVERY DAY 30 tablet 6  . CENTRUM PO TABS Oral Take 1 tablet by mouth daily.     Marland Kitchen NAPROXEN SODIUM  220 MG PO TABS  Take 1-2 tablets by mouth daily as needed for sinus and joint pain.    Marland Kitchen PRAVASTATIN SODIUM 20 MG PO TABS  TAKE 1 TABLET BY MOUTH EVERY DAY 30 tablet 11  . PSEUDOEPHEDRINE HCL 30 MG PO TABS Oral Take 30 mg by mouth daily as needed. For cough    . SOLIFENACIN SUCCINATE 10 MG PO TABS Oral Take 10 mg by mouth daily.     . WARFARIN SODIUM 5 MG PO TABS Oral Take 2.5-5 mg by mouth daily. 5mg  sun, mon wed, 2.5mg  all other days    . DULCOLAX STOOL SOFTENER PO Oral Take 1 tablet by mouth daily as needed.     Marland Kitchen FEXOFENADINE HCL 180 MG PO TABS Oral Take 180 mg by mouth daily as needed.       BP 121/56  Pulse 84  Temp(Src) 97.9 F (36.6 C) (Oral)  Resp 18  SpO2 98%  Physical Exam  Nursing note and vitals reviewed. Constitutional: She is oriented to person, place, and time. She appears well-developed and well-nourished.  Non-toxic appearance. No distress.  HENT:  Head: Normocephalic and  atraumatic.  Eyes: Conjunctivae, EOM and lids are normal. Pupils are equal, round, and reactive to light.  Neck: Normal range of motion. Neck supple. No tracheal deviation present. No mass present.  Cardiovascular: Normal rate, regular rhythm and normal heart sounds.  Exam reveals no gallop.   No murmur heard. Pulmonary/Chest: Effort normal. No stridor. No respiratory distress. She has no decreased breath sounds. She has wheezes. She has no rhonchi. She has no rales.  Abdominal: Soft. Normal appearance and bowel sounds are normal. She exhibits no distension. There is no tenderness. There is no rebound and no CVA tenderness.  Musculoskeletal: Normal range of motion. She exhibits no edema and no tenderness.  Neurological: She is alert and oriented to person, place, and time. She has normal strength. No cranial nerve deficit or sensory deficit. GCS eye subscore is 4. GCS verbal subscore is 5. GCS motor subscore is 6.  Skin: Skin is warm and dry. No abrasion and no rash noted.  Psychiatric: She has a normal mood and affect. Her speech is normal and behavior is normal.    ED Course  Procedures (including critical care time)  Labs Reviewed - No data to display No results found.   No diagnosis found.    MDM  Patient given albuterol treatments and prednisone. Lung exam improved after medications. Patient stable for        Toy Baker, MD 12/15/11 1318

## 2011-12-15 NOTE — ED Notes (Signed)
Patient transported to X-ray 

## 2011-12-15 NOTE — ED Notes (Addendum)
Pt reports cough and sob x several days was seen by an urgent care on Monday and dx with bronchities and was given rx omnicef but did not feel because of side effects. Pt came to ED for further evaluation.

## 2011-12-17 NOTE — Telephone Encounter (Signed)
Patient did not show up for her INR appt with Dr. Alexandria Lodge on 12/13/11.  Will refill but patient will need to follow up with Dr Alexandria Lodge for proper regimen. Thanks

## 2011-12-20 ENCOUNTER — Ambulatory Visit (INDEPENDENT_AMBULATORY_CARE_PROVIDER_SITE_OTHER): Payer: Medicare Other | Admitting: Pharmacist

## 2011-12-20 DIAGNOSIS — Z7901 Long term (current) use of anticoagulants: Secondary | ICD-10-CM | POA: Diagnosis not present

## 2011-12-20 DIAGNOSIS — D682 Hereditary deficiency of other clotting factors: Secondary | ICD-10-CM

## 2011-12-20 LAB — POCT INR: INR: 4.2

## 2011-12-20 NOTE — Patient Instructions (Signed)
Patient instructed to take medications as defined in the Anti-coagulation Track section of this encounter.  Patient instructed to OMIT today's dose.  Patient verbalized understanding of these instructions.  Patient instructed to RTC or ED if she develops fever, shortness of breath or chest pain or if sputum color does not continue to "clear".

## 2011-12-20 NOTE — Telephone Encounter (Signed)
Pt being seen today by dr Alexandria Lodge

## 2011-12-20 NOTE — Progress Notes (Signed)
Anti-Coagulation Progress Note  Judith [name] is a 76 y.o. female who is currently on an anti-coagulation regimen.    RECENT RESULTS: Recent results are below, the most recent result is correlated with a dose of 30 mg. per week: Lab Results  Component Value Date   INR 4.20 12/20/2011   INR 2.60 11/15/2011   INR 4.90 10/25/2011    ANTI-COAG DOSE:   Latest dosing instructions   Total Sun Mon Tue Wed Thu Fri Sat   27.5 5 mg 5 mg 2.5 mg 2.5 mg 5 mg 2.5 mg 5 mg    (5 mg1) (5 mg1) (5 mg0.5) (5 mg0.5) (5 mg1) (5 mg0.5) (5 mg1)         ANTICOAG SUMMARY: Anticoagulation Episode Summary              Current INR goal 2.0-3.0 Next INR check 12/27/2011   INR from last check 4.20! (12/20/2011)     Weekly max dose (mg)  Target end date Indefinite   Indications FACTOR V DEFICIENCY, Long term current use of anticoagulant   [facility]    Send INR reminders to ANTICOAG [facility]   Comments Index VTE (DVT) was 2007. She was subsequently documented as having Leiden Factor V Deficiency. She has indicated a continued interest in remaining upon warfarin. Should re-visit this issue with patient annually. Currently "with the advice and consent of the patient--after review of risks vs. benefits--she elects to stay upon warfarin.       Provider Role Specialty Phone number   Zoila Shutter, MD  Internal Medicine [phone]        ANTICOAG TODAY: Anticoagulation Summary as of 12/20/2011              INR goal 2.0-3.0     Selected INR 4.20! (12/20/2011) Next INR check 12/27/2011   Weekly max dose (mg)  Target end date Indefinite   Indications FACTOR V DEFICIENCY, Long term current use of anticoagulant    Anticoagulation Episode Summary              [facility] check [facility]    Send INR reminders to [facility]AG [facility]   Comments Index VTE (DVT) was 2007. She was subsequently documented as having Leiden Factor V Deficiency. She has indicated a  continued interest in remaining upon warfarin. Should re-visit this issue with patient annually. Currently "with the advice and consent of the patient--after review of risks vs. benefits--she elects to stay upon warfarin.       Provider Role Specialty Phone number   Zoila Shutter, MD  Internal Medicine [phone]        PATIENT INSTRUCTIONS: Patient Instructions  Patient instructed to take medications as defined in the Anti-coagulation Track section of this encounter.  Patient instructed to OMIT today's dose.  Patient verbalized understanding of these instructions.  Patient instructed to RTC or ED if she develops fever, shortness of breath or chest pain or if sputum color does not continue to "clear".       FOLLOW-UP Return in 7 days (on 12/27/2011) for Follow up INR.  Hulen Luster, III Pharm.D., CACP

## 2011-12-21 ENCOUNTER — Other Ambulatory Visit: Payer: Self-pay | Admitting: Internal Medicine

## 2011-12-21 ENCOUNTER — Other Ambulatory Visit: Payer: Self-pay | Admitting: Cardiology

## 2011-12-27 ENCOUNTER — Ambulatory Visit (INDEPENDENT_AMBULATORY_CARE_PROVIDER_SITE_OTHER): Payer: Medicare Other | Admitting: Pharmacist

## 2011-12-27 DIAGNOSIS — D682 Hereditary deficiency of other clotting factors: Secondary | ICD-10-CM

## 2011-12-27 DIAGNOSIS — Z7901 Long term (current) use of anticoagulants: Secondary | ICD-10-CM

## 2011-12-27 LAB — POCT INR: INR: 3.2

## 2011-12-27 NOTE — Patient Instructions (Signed)
Patient instructed to take medications as defined in the Anti-coagulation Track section of this encounter.  Patient instructed to take today's dose.  Patient verbalized understanding of these instructions.    

## 2011-12-27 NOTE — Progress Notes (Signed)
Anti-Coagulation Progress Note  Judith [name] is a 76 y.o. female who is currently on an anti-coagulation regimen.    RECENT RESULTS: Recent results are below, the most recent result is correlated with a dose of 27.5 mg. per week: Lab Results  Component Value Date   INR 3.20 12/27/2011   INR 4.20 12/20/2011   INR 2.60 11/15/2011    ANTI-COAG DOSE:   Latest dosing instructions   Total Sun Mon Tue Wed Thu Fri Sat   25 5 mg 2.5 mg 2.5 mg 2.5 mg 5 mg 2.5 mg 5 mg    (5 mg1) (5 mg0.5) (5 mg0.5) (5 mg0.5) (5 mg1) (5 mg0.5) (5 mg1)         ANTICOAG SUMMARY: Anticoagulation Episode Summary              Current INR goal 2.0-3.0 Next INR check 01/24/2012   INR from last check 3.20! (12/27/2011)     Weekly max dose (mg)  Target end date Indefinite   Indications FACTOR V DEFICIENCY, Long term current use of anticoagulant   [facility]    Send INR reminders to [facility]   Comments Index VTE (DVT) was 2007. She was subsequently documented as having Leiden Factor V Deficiency. She has indicated a continued interest in remaining upon warfarin. Should re-visit this issue with patient annually. Currently "with the advice and consent of the patient--after review of risks vs. benefits--she elects to stay upon warfarin.       Provider Role Specialty Phone number   Zoila Shutter, MD  Internal Medicine [phone]        ANTICOAG TODAY: Anticoagulation Summary as of 12/27/2011              INR goal 2.0-3.0     Selected INR 3.20! (12/27/2011) Next INR check 01/24/2012   Weekly max dose (mg)  Target end date Indefinite   Indications FACTOR V DEFICIENCY, Long term current use of anticoagulant    Anticoagulation Episode Summary              [facility] check location [facility]    Send INR reminders to [facility]   Comments Index VTE (DVT) was 2007. She was subsequently documented as having Leiden Factor V Deficiency. She has indicated a  continued interest in remaining upon warfarin. Should re-visit this issue with patient annually. Currently "with the advice and consent of the patient--after review of risks vs. benefits--she elects to stay upon warfarin.       Provider Role Specialty Phone number   Zoila Shutter, MD  Internal Medicine [phone]        PATIENT INSTRUCTIONS: Patient Instructions  Patient instructed to take medications as defined in the Anti-coagulation Track section of this encounter.  Patient instructed to take today's dose.  Patient verbalized understanding of these instructions.        FOLLOW-UP Return in 4 weeks (on 01/24/2012) for Follow up INR.  Hulen Luster, III Pharm.D., CACP

## 2011-12-28 ENCOUNTER — Encounter: Payer: Self-pay | Admitting: Gynecology

## 2011-12-28 ENCOUNTER — Other Ambulatory Visit (HOSPITAL_COMMUNITY)
Admission: RE | Admit: 2011-12-28 | Discharge: 2011-12-28 | Disposition: A | Discharge status: Home / Self Care | Payer: Medicare Other | Source: Ambulatory Visit | Attending: Gynecology | Admitting: Gynecology

## 2011-12-28 ENCOUNTER — Ambulatory Visit (INDEPENDENT_AMBULATORY_CARE_PROVIDER_SITE_OTHER): Payer: Medicare Other | Admitting: Gynecology

## 2011-12-28 VITALS — BP 138/68 | Ht 63.5 in | Wt 207.0 lb

## 2011-12-28 DIAGNOSIS — Z124 Encounter for screening for malignant neoplasm of cervix: Secondary | ICD-10-CM | POA: Diagnosis not present

## 2011-12-28 DIAGNOSIS — Z1272 Encounter for screening for malignant neoplasm of vagina: Secondary | ICD-10-CM

## 2011-12-28 DIAGNOSIS — M810 Age-related osteoporosis without current pathological fracture: Secondary | ICD-10-CM | POA: Diagnosis not present

## 2011-12-28 DIAGNOSIS — C55 Malignant neoplasm of uterus, part unspecified: Secondary | ICD-10-CM | POA: Diagnosis not present

## 2011-12-28 NOTE — Progress Notes (Signed)
Judith [name] 1935/12/24 [id number]        76 y.o.  New patient for follow up exam.  History is remarkable for adenocarcinoma of the uterus stage II status post radiation treatment in 1983. History of DVT and pulmonary embolus in 2007 found to be heterologous for Leiden factor V. 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 De Blanch 2006. History of osteoporosis on Fosamax reported last bone density 2006.  Past medical history,surgical history, medications, allergies, family history and social history were all reviewed and documented in the EPIC chart. ROS:  Was performed and pertinent positives and negatives are included in the history.  Exam: Judith [name] chaperone present Filed Vitals:   12/28/11 1133  BP: 138/68   General appearance  Normal Skin grossly normal Head/Neck normal with no cervical or supraclavicular adenopathy thyroid normal Lungs  clear Cardiac RR, without RMG Abdominal  soft, nontender, without masses, organomegaly or hernia Breasts  examined lying and sitting without masses, retractions, discharge or axillary adenopathy. Pelvic  Ext/BUS/vagina  Significant shortening of the vagina no cervix visualized significant atrophic/radiation atrophy Pap of upper vagina done  Bimanual without masses or tenderness    Anus and perineum  normal   Rectovaginal  normal sphincter tone without palpated masses or tenderness.    Assessment/Plan:  75 y.o. female for follow up.    1. History of adenocarcinoma the endometrium status post radiation 1983. Exam consistent with NED. Continued annual follow up 2. History CIN-2 status post cervical amputation and partial vaginectomy. Exam consistent with a shortened vagina no cervical remnant noted no uterus or masses palpated on bimanual. Pap of cuff done. Continue with annual follow up. 3. History of osteoporosis on Fosamax last bone density 2006 T score -3.7 left forearm, -2.6 left forearm, and  -2.1 AP spine. Schedule repeat bone density now. Discussed possible drug-free holiday as she's been on this for a number of years. Increase calcium vitamin D reviewed. 4. Mammography. Patient had her mammogram January 2013 will continue with annual mammography. SBE monthly. 5. Colonoscopy. Patient just had a colonoscopy November 2012. We'll follow up with them for the recommended follow up. 6. Health maintenance.  The patient has a number of issues to include her history of hypertension, hyperlipidemia, urinary embolus, DVT, factor V Leiden dictation. No blood work was done today as is all done through her primary physician's through [facility] who she actively follows up with. Patient will see me in a year assuming she continues well from a gynecologic standpoint, sooner as needed.    Judith Lords MD, 12:44 PM 12/28/2011

## 2011-12-28 NOTE — Patient Instructions (Signed)
All of for your bone density as scheduled.  Follow up with me in 1 year for annual follow up.

## 2012-01-16 ENCOUNTER — Other Ambulatory Visit: Payer: Self-pay | Admitting: Internal Medicine

## 2012-01-20 ENCOUNTER — Encounter: Payer: Self-pay | Admitting: Internal Medicine

## 2012-01-20 ENCOUNTER — Ambulatory Visit (INDEPENDENT_AMBULATORY_CARE_PROVIDER_SITE_OTHER): Payer: Medicare Other | Admitting: Pharmacist

## 2012-01-20 ENCOUNTER — Ambulatory Visit (INDEPENDENT_AMBULATORY_CARE_PROVIDER_SITE_OTHER): Payer: Medicare Other | Admitting: Internal Medicine

## 2012-01-20 VITALS — BP 144/69 | HR 77 | Temp 96.7°F | Wt 211.8 lb

## 2012-01-20 DIAGNOSIS — D649 Anemia, unspecified: Secondary | ICD-10-CM

## 2012-01-20 DIAGNOSIS — I10 Essential (primary) hypertension: Secondary | ICD-10-CM | POA: Diagnosis not present

## 2012-01-20 DIAGNOSIS — I872 Venous insufficiency (chronic) (peripheral): Secondary | ICD-10-CM | POA: Diagnosis not present

## 2012-01-20 DIAGNOSIS — D682 Hereditary deficiency of other clotting factors: Secondary | ICD-10-CM

## 2012-01-20 DIAGNOSIS — D539 Nutritional anemia, unspecified: Secondary | ICD-10-CM | POA: Diagnosis not present

## 2012-01-20 DIAGNOSIS — Z7901 Long term (current) use of anticoagulants: Secondary | ICD-10-CM

## 2012-01-20 DIAGNOSIS — M810 Age-related osteoporosis without current pathological fracture: Secondary | ICD-10-CM | POA: Diagnosis not present

## 2012-01-20 DIAGNOSIS — E785 Hyperlipidemia, unspecified: Secondary | ICD-10-CM

## 2012-01-20 DIAGNOSIS — Z0000 Encounter for general adult medical examination without abnormal findings: Secondary | ICD-10-CM

## 2012-01-20 LAB — COMPLETE METABOLIC PANEL WITH GFR
ALT: 14 U/L (ref 0–35)
AST: 21 U/L (ref 0–37)
Albumin: 4 g/dL (ref 3.5–5.2)
Alkaline Phosphatase: 77 U/L (ref 39–117)
BUN: 33 mg/dL — ABNORMAL HIGH (ref 6–23)
CO2: 23 mEq/L (ref 19–32)
Calcium: 9.5 mg/dL (ref 8.4–10.5)
Chloride: 107 mEq/L (ref 96–112)
Creat: 1.11 mg/dL — ABNORMAL HIGH (ref 0.50–1.10)
GFR, Est African American: 57 mL/min — ABNORMAL LOW
GFR, Est Non African American: 49 mL/min — ABNORMAL LOW
Glucose, Bld: 87 mg/dL (ref 70–99)
Potassium: 5.1 mEq/L (ref 3.5–5.3)
Sodium: 141 mEq/L (ref 135–145)
Total Bilirubin: 0.3 mg/dL (ref 0.3–1.2)
Total Protein: 6.7 g/dL (ref 6.0–8.3)

## 2012-01-20 LAB — LIPID PANEL
Cholesterol: 212 mg/dL — ABNORMAL HIGH (ref 0–200)
HDL: 60 mg/dL (ref >39)
LDL Cholesterol: 114 mg/dL — ABNORMAL HIGH (ref 0–99)
Total CHOL/HDL Ratio: 3.5 Ratio
Triglycerides: 192 mg/dL — ABNORMAL HIGH (ref <150)
VLDL: 38 mg/dL (ref 0–40)

## 2012-01-20 LAB — CBC
HCT: 40 % (ref 36.0–46.0)
Hemoglobin: 12.7 g/dL (ref 12.0–15.0)
MCH: 31.1 pg (ref 26.0–34.0)
MCHC: 31.8 g/dL (ref 30.0–36.0)
MCV: 97.8 fL (ref 78.0–100.0)
Platelets: 328 10*3/uL (ref 150–400)
RBC: 4.09 MIL/uL (ref 3.87–5.11)
RDW: 13.5 % (ref 11.5–15.5)
WBC: 8.5 10*3/uL (ref 4.0–10.5)

## 2012-01-20 LAB — POCT INR: INR: 2.3

## 2012-01-20 MED ORDER — BETAMETHASONE DIPROPIONATE 0.05 % EX CREA
TOPICAL_CREAM | Freq: Two times a day (BID) | CUTANEOUS | Status: DC
Start: 2012-01-20 — End: 2012-07-27

## 2012-01-20 NOTE — Assessment & Plan Note (Signed)
Continue Fosamax for now until we have the results of her DEXA scan.

## 2012-01-20 NOTE — Assessment & Plan Note (Signed)
Currently asymptomatic but I did give patient a prescription for Betamethasone Dipropionate 0.05% apply BID to affected area PRN

## 2012-01-20 NOTE — Progress Notes (Signed)
Anti-Coagulation Progress Note  Judith [name] is a 76 y.o. female who is currently on an anti-coagulation regimen.    RECENT RESULTS: Recent results are below, the most recent result is correlated with a dose of 25 mg. per week: Lab Results  Component Value Date   INR 2.3 01/20/2012   INR 3.20 12/27/2011   INR 4.20 12/20/2011    ANTI-COAG DOSE:   Latest dosing instructions   Total Sun Mon Tue Wed Thu Fri Sat   25 5 mg 2.5 mg 2.5 mg 2.5 mg 5 mg 2.5 mg 5 mg    (5 mg1) (5 mg0.5) (5 mg0.5) (5 mg0.5) (5 mg1) (5 mg0.5) (5 mg1)         ANTICOAG SUMMARY: Anticoagulation Episode Summary              Current INR goal 2.0-3.0 Next INR check 02/14/2012   INR from last check 2.3 (01/20/2012)     Weekly max dose (mg)  Target end date Indefinite   Indications FACTOR V DEFICIENCY, Long term current use of anticoagulant   [facility]    Send INR reminders to ANTICOAG [facility]   Comments Index VTE (DVT) was 2007. She was subsequently documented as having Leiden Factor V Deficiency. She has indicated a continued interest in remaining upon warfarin. Should re-visit this issue with patient annually. Currently "with the advice and consent of the patient--after review of risks vs. benefits--she elects to stay upon warfarin.       Provider Role Specialty Phone number   Zoila Shutter, MD  Internal Medicine [phone]        ANTICOAG TODAY: Anticoagulation Summary as of 01/20/2012              INR goal 2.0-3.0     Selected INR 2.3 (01/20/2012) Next INR check 02/14/2012   Weekly max dose (mg)  Target end date Indefinite   Indications FACTOR V DEFICIENCY, Long term current use of anticoagulant    Anticoagulation Episode Summary              [facility] check [facility]    Send INR reminders to ANTICOAG [facility]   Comments Index VTE (DVT) was 2007. She was subsequently documented as having Leiden Factor V Deficiency. She has indicated a  continued interest in remaining upon warfarin. Should re-visit this issue with patient annually. Currently "with the advice and consent of the patient--after review of risks vs. benefits--she elects to stay upon warfarin.       Provider Role Specialty Phone number   Zoila Shutter, MD  Internal Medicine [phone]        PATIENT INSTRUCTIONS: Patient Instructions  Patient instructed to take medications as defined in the Anti-coagulation Track section of this encounter.  Patient instructed to take today's dose.  Patient verbalized understanding of these instructions.        FOLLOW-UP Return in 4 weeks (on 02/14/2012) for Follow up INR.  Hulen Luster, III Pharm.D., CACP

## 2012-01-20 NOTE — Assessment & Plan Note (Signed)
Well controlled. Will continue current regimen.

## 2012-01-20 NOTE — Assessment & Plan Note (Signed)
Will check CBC today.  

## 2012-01-20 NOTE — Patient Instructions (Signed)
Continue current medication Please remember to do self breast exam monthly Will get labs today and I will call you with any abnormal lab results Will schedule for a bone density Followup with Dr. Anselm Jungling in 6 months

## 2012-01-20 NOTE — Assessment & Plan Note (Signed)
Last LDL was 121 ten months ago.   -Will repeat lipid panel today, patient was fasting overnight -Continue diet and exercise

## 2012-01-20 NOTE — Progress Notes (Signed)
HPI: Judith [name] is a 76 yo woman with past medical history of uterine cancer in 1983 s/p radiation followed by Dr. Audie Box, hypertension, hyperlipidemia, DVT and PE with history of factor V deficiency; therefore, she is on long-term use of Coumadin followed by Dr.Groce in the clinic. She is doing well since last office visit except for bronchitis in February that was treated with antibiotics x 10 days .  No complaints today except for occasional heart burn at night and itching on her LE because of the compression stocking. She uses Betamethasone Dipropionate cream which really helps and would like a prescription for it.  She is going out of town to [place] right after office visit so she would like to get her INR check today as well and would like for Dr. Alexandria Lodge to call her husband's cell phone for results: [phone].  She is also here for annual physical exam. She had colonoscopy in November 2012 and it was unremarkable except for some external hemorrhoids.  She had mammogram done in 11/2011 and it was negative.  She was recently seen by her GYN in February and will get her DEXA scan 02/01/12.     ROS: as per HPI  PE: General: Very pleasant, alert, well-developed, and cooperative to examination.  Neck: no thyromegaly or nodules noted Breasts: no nodules or mass noted bilaterally.  No nipple discharge or tenderness on palpation. Lungs: normal respiratory effort, no accessory muscle use, normal breath sounds, no crackles, and no wheezes. Heart: normal rate, regular rhythm, no murmur, no gallop, and no rub.  Abdomen: soft, non-tender, normal bowel sounds, no distention, no guarding, no rebound tenderness Msk: no joint swelling, no joint warmth, and no redness over joints.  Pulses: 2+ DP/PT pulses bilaterally Extremities: No cyanosis, clubbing, edema Neurologic: alert & oriented X3, cranial nerves II-XII intact, strength normal in all extremities, sensation intact to light touch, and gait normal.     Psych: Oriented X3, memory intact for recent and remote, normally interactive, good eye contact, not anxious appearing, and not depressed appearing.

## 2012-01-20 NOTE — Assessment & Plan Note (Signed)
INR 2.3 today, I've instructed patient to continue her current dose and will also forward results to Dr. Alexandria Lodge and he will call her if the dosage needs to be changed.

## 2012-01-20 NOTE — Patient Instructions (Signed)
Patient instructed to take medications as defined in the Anti-coagulation Track section of this encounter.  Patient instructed to take today's dose.  Patient verbalized understanding of these instructions.    

## 2012-01-24 ENCOUNTER — Ambulatory Visit: Payer: Medicare Other

## 2012-01-27 ENCOUNTER — Other Ambulatory Visit: Payer: Self-pay | Admitting: Gynecology

## 2012-02-01 ENCOUNTER — Ambulatory Visit (INDEPENDENT_AMBULATORY_CARE_PROVIDER_SITE_OTHER): Payer: Medicare Other

## 2012-02-01 DIAGNOSIS — M949 Disorder of cartilage, unspecified: Secondary | ICD-10-CM | POA: Diagnosis not present

## 2012-02-01 DIAGNOSIS — M899 Disorder of bone, unspecified: Secondary | ICD-10-CM

## 2012-02-01 DIAGNOSIS — M810 Age-related osteoporosis without current pathological fracture: Secondary | ICD-10-CM | POA: Diagnosis not present

## 2012-02-03 ENCOUNTER — Telehealth: Payer: Self-pay | Admitting: Gynecology

## 2012-02-03 ENCOUNTER — Encounter: Payer: Self-pay | Admitting: Gynecology

## 2012-02-03 NOTE — Telephone Encounter (Signed)
I called patient with her ex-report which shows osteoporosis of the left forearm. In comparison to her prior study I think this is stable. Her other values show a -1.6 at the AP spine and her hips normal. She is on alternate weekly has been on for several years. I have asked her to stay on this for now we'll repeat a DEXA in 2 years assuming continued stable then we'll consider stopping it at that point for a drug-free holiday.

## 2012-02-14 ENCOUNTER — Ambulatory Visit (INDEPENDENT_AMBULATORY_CARE_PROVIDER_SITE_OTHER): Payer: Medicare Other | Admitting: Pharmacist

## 2012-02-14 DIAGNOSIS — Z7901 Long term (current) use of anticoagulants: Secondary | ICD-10-CM | POA: Diagnosis not present

## 2012-02-14 DIAGNOSIS — D682 Hereditary deficiency of other clotting factors: Secondary | ICD-10-CM | POA: Diagnosis not present

## 2012-02-14 LAB — POCT INR: INR: 2.9

## 2012-02-14 NOTE — Patient Instructions (Signed)
Patient instructed to take medications as defined in the Anti-coagulation Track section of this encounter.  Patient instructed to take today's dose.  Patient verbalized understanding of these instructions.    

## 2012-02-14 NOTE — Progress Notes (Signed)
Anti-Coagulation Progress Note  Judith [name] is a 76 y.o. female who is currently on an anti-coagulation regimen.    RECENT RESULTS: Recent results are below, the most recent result is correlated with a dose of 25 mg. per week: Lab Results  Component Value Date   INR 2.90 02/14/2012   INR 2.3 01/20/2012   INR 3.20 12/27/2011    ANTI-COAG DOSE:   Latest dosing instructions   Total Sun Mon Tue Wed Thu Fri Sat   22.5 2.5 mg 2.5 mg 5 mg 2.5 mg 2.5 mg 5 mg 2.5 mg    (5 mg0.5) (5 mg0.5) (5 mg1) (5 mg0.5) (5 mg0.5) (5 mg1) (5 mg0.5)         ANTICOAG SUMMARY: Anticoagulation Episode Summary              Current INR goal 2.0-3.0 Next INR check 03/13/2012   INR from last check 2.90 (02/14/2012)     Weekly max dose (mg)  Target end date Indefinite   Indications FACTOR V DEFICIENCY, Long term current use of anticoagulant   [facility]    Send INR reminders to ANTICOAG [facility]   Comments Index VTE (DVT) was 2007. She was subsequently documented as having Leiden Factor V Deficiency. She has indicated a continued interest in remaining upon warfarin. Should re-visit this issue with patient annually. Currently "with the advice and consent of the patient--after review of risks vs. benefits--she elects to stay upon warfarin.       Provider Role Specialty Phone number   Zoila Shutter, MD  Internal Medicine [phone]        ANTICOAG TODAY: Anticoagulation Summary as of 02/14/2012              INR goal 2.0-3.0     Selected INR 2.90 (02/14/2012) Next INR check 03/13/2012   Weekly max dose (mg)  Target end date Indefinite   Indications FACTOR V DEFICIENCY, Long term current use of anticoagulant    Anticoagulation Episode Summary              [facility] check location [facility]    Send INR reminders to ANTICOAG IMP   Comments Index VTE (DVT) was 2007. She was subsequently documented as having Leiden Factor V Deficiency. She has indicated a  continued interest in remaining upon warfarin. Should re-visit this issue with patient annually. Currently "with the advice and consent of the patient--after review of risks vs. benefits--she elects to stay upon warfarin.       Provider Role Specialty Phone number   Zoila Shutter, MD  Internal Medicine [phone]        PATIENT INSTRUCTIONS: Patient Instructions  Patient instructed to take medications as defined in the Anti-coagulation Track section of this encounter.  Patient instructed to take today's dose.  Patient verbalized understanding of these instructions.        FOLLOW-UP Return in 4 weeks (on 03/13/2012) for Follow up INR.  Hulen Luster, III Pharm.D., CACP

## 2012-02-15 ENCOUNTER — Other Ambulatory Visit: Payer: Self-pay | Admitting: Cardiology

## 2012-02-15 ENCOUNTER — Other Ambulatory Visit: Payer: Self-pay

## 2012-02-17 DIAGNOSIS — H52209 Unspecified astigmatism, unspecified eye: Secondary | ICD-10-CM | POA: Diagnosis not present

## 2012-02-17 DIAGNOSIS — H5210 Myopia, unspecified eye: Secondary | ICD-10-CM | POA: Diagnosis not present

## 2012-02-17 DIAGNOSIS — H04129 Dry eye syndrome of unspecified lacrimal gland: Secondary | ICD-10-CM | POA: Diagnosis not present

## 2012-02-17 DIAGNOSIS — Z961 Presence of intraocular lens: Secondary | ICD-10-CM | POA: Diagnosis not present

## 2012-02-21 ENCOUNTER — Ambulatory Visit: Payer: Medicare Other

## 2012-02-29 ENCOUNTER — Other Ambulatory Visit: Payer: Self-pay | Admitting: Cardiology

## 2012-03-13 ENCOUNTER — Ambulatory Visit (INDEPENDENT_AMBULATORY_CARE_PROVIDER_SITE_OTHER): Payer: Medicare Other | Admitting: Pharmacist

## 2012-03-13 DIAGNOSIS — D682 Hereditary deficiency of other clotting factors: Secondary | ICD-10-CM | POA: Diagnosis not present

## 2012-03-13 DIAGNOSIS — Z7901 Long term (current) use of anticoagulants: Secondary | ICD-10-CM | POA: Diagnosis not present

## 2012-03-13 LAB — POCT INR: INR: 2.2

## 2012-03-13 NOTE — Progress Notes (Signed)
Anti-Coagulation Progress Note  Judith [name] is a 76 y.o. female who is currently on an anti-coagulation regimen.    RECENT RESULTS: Recent results are below, the most recent result is correlated with a dose of 22.5 mg. per week: Lab Results  Component Value Date   INR 2.20 03/13/2012   INR 2.90 02/14/2012   INR 2.3 01/20/2012    ANTI-COAG DOSE:   Latest dosing instructions   Total Sun Mon Tue Wed Thu Fri Sat   22.5 2.5 mg 2.5 mg 5 mg 2.5 mg 2.5 mg 5 mg 2.5 mg    (5 mg0.5) (5 mg0.5) (5 mg1) (5 mg0.5) (5 mg0.5) (5 mg1) (5 mg0.5)         ANTICOAG SUMMARY: Anticoagulation Episode Summary              Current INR goal 2.0-3.0 Next INR check 04/10/2012   INR from last check 2.20 (03/13/2012)     Weekly max dose (mg)  Target end date Indefinite   Indications FACTOR V DEFICIENCY, Long term current use of anticoagulant   [facility] check location [facility]    Send INR reminders to ANTICOAG IMP   Comments Index VTE (DVT) was 2007. She was subsequently documented as having Leiden Factor V Deficiency. She has indicated a continued interest in remaining upon warfarin. Should re-visit this issue with patient annually. Currently "with the advice and consent of the patient--after review of risks vs. benefits--she elects to stay upon warfarin.       Provider Role Specialty Phone number   Zoila Shutter, MD  Internal Medicine [phone]        ANTICOAG TODAY: Anticoagulation Summary as of 03/13/2012              INR goal 2.0-3.0     Selected INR 2.20 (03/13/2012) Next INR check 04/10/2012   Weekly max dose (mg)  Target end date Indefinite   Indications FACTOR V DEFICIENCY, Long term current use of anticoagulant    Anticoagulation Episode Summary              [facility] check location [facility]    Send INR reminders to ANTICOAG [facility]   Comments Index VTE (DVT) was 2007. She was subsequently documented as having Leiden Factor V Deficiency. She has indicated a  continued interest in remaining upon warfarin. Should re-visit this issue with patient annually. Currently "with the advice and consent of the patient--after review of risks vs. benefits--she elects to stay upon warfarin.       Provider Role Specialty Phone number   Zoila Shutter, MD  Internal Medicine [phone]        PATIENT INSTRUCTIONS: Patient Instructions  Patient instructed to take medications as defined in the Anti-coagulation Track section of this encounter.  Patient instructed to take today's dose.  Patient verbalized understanding of these instructions.        FOLLOW-UP Return in 4 weeks (on 04/10/2012) for Follow up INR.  Hulen Luster, III Pharm.D., CACP

## 2012-03-13 NOTE — Patient Instructions (Signed)
Patient instructed to take medications as defined in the Anti-coagulation Track section of this encounter.  Patient instructed to take today's dose.  Patient verbalized understanding of these instructions.    

## 2012-04-10 ENCOUNTER — Ambulatory Visit (INDEPENDENT_AMBULATORY_CARE_PROVIDER_SITE_OTHER): Payer: Medicare Other | Admitting: Pharmacist

## 2012-04-10 ENCOUNTER — Ambulatory Visit: Payer: Medicare Other

## 2012-04-10 DIAGNOSIS — Z7901 Long term (current) use of anticoagulants: Secondary | ICD-10-CM | POA: Diagnosis not present

## 2012-04-10 DIAGNOSIS — D682 Hereditary deficiency of other clotting factors: Secondary | ICD-10-CM | POA: Diagnosis not present

## 2012-04-10 LAB — POCT INR: INR: 2.4

## 2012-04-10 NOTE — Patient Instructions (Signed)
Patient instructed to take medications as defined in the Anti-coagulation Track section of this encounter.  Patient instructed to take today's dose.  Patient verbalized understanding of these instructions.    

## 2012-04-10 NOTE — Progress Notes (Signed)
Anti-Coagulation Progress Note  Judith [name] is a 76 y.o. female who is currently on an anti-coagulation regimen.    RECENT RESULTS: Recent results are below, the most recent result is correlated with a dose of 22.5 mg. per week: Lab Results  Component Value Date   INR 2.40 04/10/2012   INR 2.20 03/13/2012   INR 2.90 02/14/2012    ANTI-COAG DOSE:   Latest dosing instructions   Total Sun Mon Tue Wed Thu Fri Sat   22.5 2.5 mg 2.5 mg 5 mg 2.5 mg 2.5 mg 5 mg 2.5 mg    (5 mg0.5) (5 mg0.5) (5 mg1) (5 mg0.5) (5 mg0.5) (5 mg1) (5 mg0.5)         ANTICOAG SUMMARY: Anticoagulation Episode Summary              Current INR goal 2.0-3.0 Next INR check 05/01/2012   INR from last check 2.40 (04/10/2012)     Weekly max dose (mg)  Target end date Indefinite   Indications FACTOR V DEFICIENCY, Long term current use of anticoagulant   [facility] check location [facility]    Send INR reminders to ANTICOAG [facility]   Comments Index VTE (DVT) was 2007. She was subsequently documented as having Leiden Factor V Deficiency. She has indicated a continued interest in remaining upon warfarin. Should re-visit this issue with patient annually. Currently "with the advice and consent of the patient--after review of risks vs. benefits--she elects to stay upon warfarin.       Provider Role Specialty Phone number   Zoila Shutter, MD  Internal Medicine [phone]        ANTICOAG TODAY: Anticoagulation Summary as of 04/10/2012              INR goal 2.0-3.0     Selected INR 2.40 (04/10/2012) Next INR check 05/01/2012   Weekly max dose (mg)  Target end date Indefinite   Indications FACTOR V DEFICIENCY, Long term current use of anticoagulant    Anticoagulation Episode Summary              [facility] check location [facility]    Send INR reminders to ANTICOAG [facility]   Comments Index VTE (DVT) was 2007. She was subsequently documented as having Leiden Factor V Deficiency. She has indicated a  continued interest in remaining upon warfarin. Should re-visit this issue with patient annually. Currently "with the advice and consent of the patient--after review of risks vs. benefits--she elects to stay upon warfarin.       Provider Role Specialty Phone number   Zoila Shutter, MD  Internal Medicine [phone]        PATIENT INSTRUCTIONS: Patient Instructions  Patient instructed to take medications as defined in the Anti-coagulation Track section of this encounter.  Patient instructed to take today's dose.  Patient verbalized understanding of these instructions.        FOLLOW-UP Return in 3 weeks (on 05/01/2012) for Follow up INR at 0845h.  Hulen Luster, III Pharm.D., CACP

## 2012-04-24 ENCOUNTER — Other Ambulatory Visit: Payer: Self-pay | Admitting: Internal Medicine

## 2012-04-24 DIAGNOSIS — I10 Essential (primary) hypertension: Secondary | ICD-10-CM

## 2012-05-01 ENCOUNTER — Ambulatory Visit (INDEPENDENT_AMBULATORY_CARE_PROVIDER_SITE_OTHER): Payer: Medicare Other | Admitting: Pharmacist

## 2012-05-01 DIAGNOSIS — Z7901 Long term (current) use of anticoagulants: Secondary | ICD-10-CM

## 2012-05-01 DIAGNOSIS — D682 Hereditary deficiency of other clotting factors: Secondary | ICD-10-CM

## 2012-05-01 LAB — POCT INR: INR: 2.1

## 2012-05-01 NOTE — Patient Instructions (Signed)
Patient instructed to take medications as defined in the Anti-coagulation Track section of this encounter.  Patient instructed to take today's dose.  Patient verbalized understanding of these instructions.    

## 2012-05-01 NOTE — Progress Notes (Signed)
Anti-Coagulation Progress Note  Judith [name] is a 76 y.o. female who is currently on an anti-coagulation regimen.    RECENT RESULTS: Recent results are below, the most recent result is correlated with a dose of 22.5 mg. per week: Lab Results  Component Value Date   INR 2.10 05/01/2012   INR 2.40 04/10/2012   INR 2.20 03/13/2012    ANTI-COAG DOSE:   Latest dosing instructions   Total Sun Mon Tue Wed Thu Fri Sat   25 2.5 mg 5 mg 2.5 mg 5 mg 2.5 mg 5 mg 2.5 mg    (5 mg0.5) (5 mg1) (5 mg0.5) (5 mg1) (5 mg0.5) (5 mg1) (5 mg0.5)         ANTICOAG SUMMARY: Anticoagulation Episode Summary              Current INR goal 2.0-3.0 Next INR check 05/29/2012   INR from last check 2.10 (05/01/2012)     Weekly max dose (mg)  Target end date Indefinite   Indications FACTOR V DEFICIENCY, Long term current use of anticoagulant   [facility] check [facility]    Send INR reminders to [facility]   Comments Index VTE (DVT) was 2007. She was subsequently documented as having Leiden Factor V Deficiency. She has indicated a continued interest in remaining upon warfarin. Should re-visit this issue with patient annually. Currently "with the advice and consent of the patient--after review of risks vs. benefits--she elects to stay upon warfarin.       Provider Role Specialty Phone number   Zoila Shutter, MD  Internal Medicine [phone]        ANTICOAG TODAY: Anticoagulation Summary as of 05/01/2012              INR goal 2.0-3.0     Selected INR 2.10 (05/01/2012) Next INR check 05/29/2012   Weekly max dose (mg)  Target end date Indefinite   Indications FACTOR V DEFICIENCY, Long term current use of anticoagulant    Anticoagulation Episode Summary              [facility] check location [facility]    Send INR reminders to [facility]   Comments Index VTE (DVT) was 2007. She was subsequently documented as having Leiden Factor V Deficiency. She has indicated a  continued interest in remaining upon warfarin. Should re-visit this issue with patient annually. Currently "with the advice and consent of the patient--after review of risks vs. benefits--she elects to stay upon warfarin.       Provider Role Specialty Phone number   Zoila Shutter, MD  Internal Medicine [phone]        PATIENT INSTRUCTIONS: Patient Instructions  Patient instructed to take medications as defined in the Anti-coagulation Track section of this encounter.  Patient instructed to take today's dose.  Patient verbalized understanding of these instructions.        FOLLOW-UP Return in 4 weeks (on 05/29/2012) for Follow up INR at 0845h.  Hulen Luster, III Pharm.D., CACP

## 2012-05-02 DIAGNOSIS — I8310 Varicose veins of unspecified lower extremity with inflammation: Secondary | ICD-10-CM | POA: Diagnosis not present

## 2012-05-25 DIAGNOSIS — I8310 Varicose veins of unspecified lower extremity with inflammation: Secondary | ICD-10-CM | POA: Diagnosis not present

## 2012-05-29 ENCOUNTER — Ambulatory Visit (INDEPENDENT_AMBULATORY_CARE_PROVIDER_SITE_OTHER): Payer: Medicare Other | Admitting: Pharmacist

## 2012-05-29 DIAGNOSIS — D682 Hereditary deficiency of other clotting factors: Secondary | ICD-10-CM

## 2012-05-29 DIAGNOSIS — Z7901 Long term (current) use of anticoagulants: Secondary | ICD-10-CM | POA: Diagnosis not present

## 2012-05-29 LAB — POCT INR: INR: 2.1

## 2012-05-29 NOTE — Patient Instructions (Signed)
Patient instructed to take medications as defined in the Anti-coagulation Track section of this encounter.  Patient instructed to take today's dose.  Patient verbalized understanding of these instructions.    

## 2012-05-29 NOTE — Progress Notes (Signed)
Anti-Coagulation Progress Note  Judith [name] is a 76 y.o. female who is currently on an anti-coagulation regimen.    RECENT RESULTS: Recent results are below, the most recent result is correlated with a dose of 25 mg. per week: Lab Results  Component Value Date   INR 2.10 05/29/2012   INR 2.10 05/01/2012   INR 2.40 04/10/2012    ANTI-COAG DOSE:   Latest dosing instructions   Total Sun Mon Tue Wed Thu Fri Sat   27.5 5 mg 2.5 mg 5 mg 2.5 mg 5 mg 2.5 mg 5 mg    (5 mg1) (5 mg0.5) (5 mg1) (5 mg0.5) (5 mg1) (5 mg0.5) (5 mg1)         ANTICOAG SUMMARY: Anticoagulation Episode Summary              Current INR goal 2.0-3.0 Next INR check 06/26/2012   INR from last check 2.10 (05/29/2012)     Weekly max dose (mg)  Target end date Indefinite   Indications FACTOR V DEFICIENCY, Long term current use of anticoagulant   [facility]    Send INR reminders to [facility]   Comments Index VTE (DVT) was 2007. She was subsequently documented as having Leiden Factor V Deficiency. She has indicated a continued interest in remaining upon warfarin. Should re-visit this issue with patient annually. Currently "with the advice and consent of the patient--after review of risks vs. benefits--she elects to stay upon warfarin.       Provider Role Specialty Phone number   Zoila Shutter, MD  Internal Medicine [phone]        ANTICOAG TODAY: Anticoagulation Summary as of 05/29/2012              INR goal 2.0-3.0     Selected INR 2.10 (05/29/2012) Next INR check 06/26/2012   Weekly max dose (mg)  Target end date Indefinite   Indications FACTOR V DEFICIENCY, Long term current use of anticoagulant    Anticoagulation Episode Summary              IN[facility] check location [facility]    Send INR reminders to [facility]   Comments Index VTE (DVT) was 2007. She was subsequently documented as having Leiden Factor V Deficiency. She has indicated a  continued interest in remaining upon warfarin. Should re-visit this issue with patient annually. Currently "with the advice and consent of the patient--after review of risks vs. benefits--she elects to stay upon warfarin.       Provider Role Specialty Phone number   Zoila Shutter, MD  Internal Medicine [phone]        PATIENT INSTRUCTIONS: Patient Instructions  Patient instructed to take medications as defined in the Anti-coagulation Track section of this encounter.  Patient instructed to take today's dose.  Patient verbalized understanding of these instructions.        FOLLOW-UP Return in 4 weeks (on 06/26/2012) for Follow up  INR at 0845h.  Hulen Luster, III Pharm.D., CACP

## 2012-06-18 ENCOUNTER — Other Ambulatory Visit: Payer: Self-pay | Admitting: Internal Medicine

## 2012-06-26 ENCOUNTER — Ambulatory Visit (INDEPENDENT_AMBULATORY_CARE_PROVIDER_SITE_OTHER): Payer: Medicare Other | Admitting: Pharmacist

## 2012-06-26 DIAGNOSIS — Z7901 Long term (current) use of anticoagulants: Secondary | ICD-10-CM

## 2012-06-26 DIAGNOSIS — D682 Hereditary deficiency of other clotting factors: Secondary | ICD-10-CM | POA: Diagnosis not present

## 2012-06-26 LAB — POCT INR: INR: 2.7

## 2012-06-26 MED ORDER — WARFARIN SODIUM 5 MG PO TABS: ORAL_TABLET | ORAL | Status: DC

## 2012-06-26 MED ORDER — WARFARIN SODIUM 5 MG PO TABS: 5.0000 mg | ORAL_TABLET | Freq: Every day | ORAL | Status: DC

## 2012-06-26 NOTE — Progress Notes (Signed)
Anti-Coagulation Progress Note  Judith [name] is a 76 y.o. female who is currently on an anti-coagulation regimen.    RECENT RESULTS: Recent results are below, the most recent result is correlated with a dose of 27.5 mg. per week: Lab Results  Component Value Date   INR 2.70 06/26/2012   INR 2.10 05/29/2012   INR 2.10 05/01/2012    ANTI-COAG DOSE:   Latest dosing instructions   Total Sun Mon Tue Wed Thu Fri Sat   27.5 5 mg 2.5 mg 5 mg 2.5 mg 5 mg 2.5 mg 5 mg    (5 mg1) (5 mg0.5) (5 mg1) (5 mg0.5) (5 mg1) (5 mg0.5) (5 mg1)         ANTICOAG SUMMARY: Anticoagulation Episode Summary              Current INR goal 2.0-3.0 Next INR check 07/24/2012   INR from last check 2.70 (06/26/2012)     Weekly max dose (mg)  Target end date Indefinite   Indications FACTOR V DEFICIENCY, Long term current use of anticoagulant   [facility] check location [facility]    Send INR reminders to [facility]   Comments Index VTE (DVT) was 2007. She was subsequently documented as having Leiden Factor V Deficiency. She has indicated a continued interest in remaining upon warfarin. Should re-visit this issue with patient annually. Currently "with the advice and consent of the patient--after review of risks vs. benefits--she elects to stay upon warfarin.       Provider Role Specialty Phone number   Zoila Shutter, MD  Internal Medicine [phone]        ANTICOAG TODAY: Anticoagulation Summary as of 06/26/2012              INR goal 2.0-3.0     Selected INR 2.70 (06/26/2012) Next INR check 07/24/2012   Weekly max dose (mg)  Target end date Indefinite   Indications FACTOR V DEFICIENCY, Long term current use of anticoagulant    Anticoagulation Episode Summary              INR check location [facility]    Send INR reminders to [facility]   Comments Index VTE (DVT) was 2007. She was subsequently documented as having Leiden Factor V Deficiency. She has indicated a  continued interest in remaining upon warfarin. Should re-visit this issue with patient annually. Currently "with the advice and consent of the patient--after review of risks vs. benefits--she elects to stay upon warfarin.       Provider Role Specialty Phone number   Zoila Shutter, MD  Internal Medicine [phone]        PATIENT INSTRUCTIONS: Patient Instructions  Patient instructed to take medications as defined in the Anti-coagulation Track section of this encounter.  Patient instructed to take today's dose.  Patient verbalized understanding of these instructions.        FOLLOW-UP Return in 4 weeks (on 07/24/2012) for Follow up INR at 0845.  Hulen Luster, III Pharm.D., CACP

## 2012-06-26 NOTE — Addendum Note (Signed)
Addended by: Hulen Luster B on: 06/26/2012 12:07 PM   Modules accepted: Orders

## 2012-06-26 NOTE — Patient Instructions (Signed)
Patient instructed to take medications as defined in the Anti-coagulation Track section of this encounter.  Patient instructed to take today's dose.  Patient verbalized understanding of these instructions.    

## 2012-06-27 NOTE — Progress Notes (Signed)
Agree 

## 2012-07-17 DIAGNOSIS — Z23 Encounter for immunization: Secondary | ICD-10-CM | POA: Diagnosis not present

## 2012-07-24 ENCOUNTER — Ambulatory Visit (INDEPENDENT_AMBULATORY_CARE_PROVIDER_SITE_OTHER): Payer: Medicare Other | Admitting: Pharmacist

## 2012-07-24 DIAGNOSIS — D682 Hereditary deficiency of other clotting factors: Secondary | ICD-10-CM | POA: Diagnosis not present

## 2012-07-24 DIAGNOSIS — Z7901 Long term (current) use of anticoagulants: Secondary | ICD-10-CM

## 2012-07-24 LAB — POCT INR: INR: 4.2

## 2012-07-24 NOTE — Progress Notes (Signed)
Anti-Coagulation Progress Note  Judith [name] is a 76 y.o. female who is currently on an anti-coagulation regimen.    RECENT RESULTS: Recent results are below, the most recent result is correlated with a dose of 27.5 mg. per week: Lab Results  Component Value Date   INR 4.2 07/24/2012   INR 2.70 06/26/2012   INR 2.10 05/29/2012    ANTI-COAG DOSE:   Latest dosing instructions   Total Sun Mon Tue Wed Thu Fri Sat   22.5 5 mg 2.5 mg 2.5 mg 2.5 mg 2.5 mg 2.5 mg 5 mg    (5 mg1) (5 mg0.5) (5 mg0.5) (5 mg0.5) (5 mg0.5) (5 mg0.5) (5 mg1)         ANTICOAG SUMMARY: Anticoagulation Episode Summary              Current INR goal 2.0-3.0 Next INR check 07/31/2012   INR from last check 4.2! (07/24/2012)     Weekly max dose (mg)  Target end date Indefinite   Indications FACTOR V DEFICIENCY, Long term current use of anticoagulant   [facility] check location [facility]    Send INR reminders to [facility]   Comments Index VTE (DVT) was 2007. She was subsequently documented as having Leiden Factor V Deficiency. She has indicated a continued interest in remaining upon warfarin. Should re-visit this issue with patient annually. Currently "with the advice and consent of the patient--after review of risks vs. benefits--she elects to stay upon warfarin.       Provider Role Specialty Phone number   Zoila Shutter, MD  Internal Medicine [phone]        ANTICOAG TODAY: Anticoagulation Summary as of 07/24/2012              INR goal 2.0-3.0     Selected INR 4.2! (07/24/2012) Next INR check 07/31/2012   Weekly max dose (mg)  Target end date Indefinite   Indications FACTOR V DEFICIENCY, Long term current use of anticoagulant    Anticoagulation Episode Summary              INR check location [facility]    Send INR reminders to [facility]   Comments Index VTE (DVT) was 2007. She was subsequently documented as having Leiden Factor V Deficiency. She has  indicated a continued interest in remaining upon warfarin. Should re-visit this issue with patient annually. Currently "with the advice and consent of the patient--after review of risks vs. benefits--she elects to stay upon warfarin.       Provider Role Specialty Phone number   Zoila Shutter, MD  Internal Medicine [phone]        PATIENT INSTRUCTIONS: Patient Instructions  Patient instructed to take medications as defined in the Anti-coagulation Track section of this encounter.  Patient instructed to OMIT/HOLD today's dose.  Patient verbalized understanding of these instructions.        FOLLOW-UP Return in 7 days (on 07/31/2012) for Follow up INR at 0900h.  Hulen Luster, III Pharm.D., CACP

## 2012-07-24 NOTE — Patient Instructions (Signed)
Patient instructed to take medications as defined in the Anti-coagulation Track section of this encounter.  Patient instructed to OMIT/HOLD today's dose.  Patient verbalized understanding of these instructions.    

## 2012-07-27 ENCOUNTER — Encounter: Payer: Self-pay | Admitting: Internal Medicine

## 2012-07-27 ENCOUNTER — Other Ambulatory Visit: Payer: Self-pay | Admitting: Internal Medicine

## 2012-07-31 ENCOUNTER — Ambulatory Visit (INDEPENDENT_AMBULATORY_CARE_PROVIDER_SITE_OTHER): Payer: Medicare Other | Admitting: Pharmacist

## 2012-07-31 DIAGNOSIS — Z7901 Long term (current) use of anticoagulants: Secondary | ICD-10-CM | POA: Diagnosis not present

## 2012-07-31 DIAGNOSIS — D682 Hereditary deficiency of other clotting factors: Secondary | ICD-10-CM | POA: Diagnosis not present

## 2012-07-31 LAB — POCT INR: INR: 2.2

## 2012-07-31 NOTE — Progress Notes (Signed)
Anti-Coagulation Progress Note  Judith [name] is a 76 y.o. female who is currently on an anti-coagulation regimen.    RECENT RESULTS: Recent results are below, the most recent result is correlated with a dose of 22.5 mg. per week: Lab Results  Component Value Date   INR 2.20 07/31/2012   INR 4.2 07/24/2012   INR 2.70 06/26/2012    ANTI-COAG DOSE:   Latest dosing instructions   Total Sun Mon Tue Wed Thu Fri Sat   22.5 5 mg 2.5 mg 2.5 mg 2.5 mg 2.5 mg 2.5 mg 5 mg    (5 mg1) (5 mg0.5) (5 mg0.5) (5 mg0.5) (5 mg0.5) (5 mg0.5) (5 mg1)         ANTICOAG SUMMARY: Anticoagulation Episode Summary              Current INR goal 2.0-3.0 Next INR check 09/04/2012   INR from last check 2.20 (07/31/2012)     Weekly max dose (mg)  Target end date Indefinite   Indications FACTOR V DEFICIENCY, Long term current use of anticoagulant   [facility] check location [facility]    Send INR reminders to [facility]   Comments Index VTE (DVT) was 2007. She was subsequently documented as having Leiden Factor V Deficiency. She has indicated a continued interest in remaining upon warfarin. Should re-visit this issue with patient annually. Currently "with the advice and consent of the patient--after review of risks vs. benefits--she elects to stay upon warfarin.       Provider Role Specialty Phone number   Zoila Shutter, MD  Internal Medicine [phone]        ANTICOAG TODAY: Anticoagulation Summary as of 07/31/2012              INR goal 2.0-3.0     Selected INR 2.20 (07/31/2012) Next INR check 09/04/2012   Weekly max dose (mg)  Target end date Indefinite   Indications FACTOR V DEFICIENCY, Long term current use of anticoagulant    Anticoagulation Episode Summary              [facility] check location [facility]    Send INR reminders to [facility]   Comments Index VTE (DVT) was 2007. She was subsequently documented as having Leiden Factor V Deficiency. She has  indicated a continued interest in remaining upon warfarin. Should re-visit this issue with patient annually. Currently "with the advice and consent of the patient--after review of risks vs. benefits--she elects to stay upon warfarin.       Provider Role Specialty Phone number   Zoila Shutter, MD  Internal Medicine [phone]        PATIENT INSTRUCTIONS: Patient Instructions  Patient instructed to take medications as defined in the Anti-coagulation Track section of this encounter.  Patient instructed to take today's dose.  Patient verbalized understanding of these instructions.        FOLLOW-UP Return in 5 weeks (on 09/04/2012) for Follow up INR at 0845h.  Hulen Luster, III Pharm.D., CACP

## 2012-07-31 NOTE — Patient Instructions (Signed)
Patient instructed to take medications as defined in the Anti-coagulation Track section of this encounter.  Patient instructed to take today's dose.  Patient verbalized understanding of these instructions.    

## 2012-08-01 ENCOUNTER — Other Ambulatory Visit: Payer: Self-pay | Admitting: Internal Medicine

## 2012-08-01 NOTE — Progress Notes (Signed)
Agree with plan 

## 2012-08-21 ENCOUNTER — Other Ambulatory Visit: Payer: Self-pay | Admitting: Internal Medicine

## 2012-08-22 ENCOUNTER — Ambulatory Visit (INDEPENDENT_AMBULATORY_CARE_PROVIDER_SITE_OTHER): Payer: Medicare Other | Admitting: Internal Medicine

## 2012-08-22 ENCOUNTER — Encounter: Payer: Self-pay | Admitting: Internal Medicine

## 2012-08-22 VITALS — BP 128/74 | HR 84 | Temp 97.0°F | Wt 219.8 lb

## 2012-08-22 DIAGNOSIS — R42 Dizziness and giddiness: Secondary | ICD-10-CM

## 2012-08-22 DIAGNOSIS — Z7901 Long term (current) use of anticoagulants: Secondary | ICD-10-CM | POA: Diagnosis not present

## 2012-08-22 DIAGNOSIS — I951 Orthostatic hypotension: Secondary | ICD-10-CM | POA: Diagnosis not present

## 2012-08-22 DIAGNOSIS — N179 Acute kidney failure, unspecified: Secondary | ICD-10-CM | POA: Diagnosis not present

## 2012-08-22 DIAGNOSIS — D682 Hereditary deficiency of other clotting factors: Secondary | ICD-10-CM

## 2012-08-22 LAB — POCT INR: INR: 2.5

## 2012-08-22 MED ORDER — BETAMETHASONE DIPROPIONATE 0.05 % EX CREA: TOPICAL_CREAM | Freq: Two times a day (BID) | CUTANEOUS | Status: DC

## 2012-08-22 NOTE — Patient Instructions (Addendum)
Please call [phone] if you need to make appointment  Follow up with Dr. Anselm Jungling in 6 months

## 2012-08-22 NOTE — Assessment & Plan Note (Signed)
Very mild AKI in 01/2012 which was likely due to prerenal because patient was not drinking much fluid and she was instructed to increase her oral fluid intake at that time.  Pt did not want labs today so I will repeat BMP next office visit.

## 2012-08-22 NOTE — Assessment & Plan Note (Addendum)
Unclear etiology at this time. She has been having mild chronic dizziness intermittently in the past 10 years but has not mentioned to her previous doctors.  She is not anemic in 01/2012 when i last checked her CBC.  She does not have focal neurological symptoms that are concerning for CVA; although she does have dizziness with looking up and down which can be caused by vertebrobasilar insufficiency.   Her orthostatic vitals were negative.   -Consider MRA of brain if worsen -Will continue to monitor at this time  Case discussed with attending, Dr. Rogelia Boga

## 2012-08-22 NOTE — Assessment & Plan Note (Signed)
She in on lifelong anticoagulation and INR is 2.5.  She is supposed to see Dr. Alexandria Lodge on Monday 10/21 but since she is here today, she would like to have her INR checked. I will forward results to Dr. Alexandria Lodge.

## 2012-08-22 NOTE — Progress Notes (Signed)
HPI: [name] is a 76 yo woman with PMH of factor V leiden deficiency on chronic Coumadin since 2006, HLP, HTN, Hx of adenocarcinoma of endocervix: 1983 dx, had 2 radium implants , then 15 radiation treatments.  Stage IV. Removed cervix 2006.  Bone scan is due 2015. Dr. Marry Guan was her cancer doctor, now followed by Dr. Audie Box as well as GYN. presents today with dizziness x 10 years in duration intermittent.  It happens when she looks up or down.  She describes lightheaded but no LOC.  She denies any focal weakness, but does have some right hand numbness or tingling at night if she sleeps on her arm or when she uses computer, slurred speech, N/V, headache. She has not been drinking much water especially when she goes to the doctor's office.  Otherwise she has been doing well, no other complaints. Orthostatic vitals normal.   ROS: as per HPI  PE: General: alert, well-developed, and cooperative to examination.  Lungs: normal respiratory effort, no accessory muscle use, normal breath sounds, no crackles, and no wheezes. Heart: normal rate, regular rhythm, no murmur, no gallop, and no rub.  Abdomen: soft, non-tender, normal bowel sounds, no distention, no guarding, no rebound tenderness  Msk: no joint swelling, no joint warmth, and no redness over joints.  Pulses: 2+ DP/PT pulses bilaterally Extremities: No cyanosis, clubbing, trace edema on LE Neurologic: alert & oriented X3, cranial nerves II-XII intact, strength normal in all extremities, sensation intact to light touch, and gait normal.  Skin: turgor normal and no rashes.  Psych: Oriented X3, memory intact for recent and remote, normally interactive, good eye contact, not anxious appearing, and not depressed appearing.

## 2012-09-04 ENCOUNTER — Ambulatory Visit: Payer: Medicare Other

## 2012-09-11 ENCOUNTER — Ambulatory Visit (INDEPENDENT_AMBULATORY_CARE_PROVIDER_SITE_OTHER): Payer: Medicare Other | Admitting: Pharmacist

## 2012-09-11 DIAGNOSIS — Z7901 Long term (current) use of anticoagulants: Secondary | ICD-10-CM | POA: Diagnosis not present

## 2012-09-11 DIAGNOSIS — D682 Hereditary deficiency of other clotting factors: Secondary | ICD-10-CM | POA: Diagnosis not present

## 2012-09-11 LAB — POCT INR: INR: 2

## 2012-09-11 NOTE — Progress Notes (Signed)
Anti-Coagulation Progress Note  Judith [name] is a 76 y.o. female who is currently on an anti-coagulation regimen.    RECENT RESULTS: Recent results are below, the most recent result is correlated with a dose of 22.5 mg. per week: Lab Results  Component Value Date   INR 2.0 09/11/2012   INR 2.5 08/22/2012   INR 2.20 07/31/2012    ANTI-COAG DOSE:   Latest dosing instructions   Total Sun Mon Tue Wed Thu Fri Sat   27.5 5 mg 2.5 mg 5 mg 2.5 mg 5 mg 2.5 mg 5 mg    (5 mg1) (5 mg0.5) (5 mg1) (5 mg0.5) (5 mg1) (5 mg0.5) (5 mg1)         ANTICOAG SUMMARY: Anticoagulation Episode Summary              Current INR goal 2.0-3.0 Next INR check 10/09/2012   INR from last check 2.0 (09/11/2012)     Weekly max dose (mg)  Target end date Indefinite   Indications FACTOR V DEFICIENCY [286.3], Long term current use of anticoagulant [V58.61]   [facility] check location [facility]    Send INR reminders to [facility]   Comments Index VTE (DVT) was 2007. She was subsequently documented as having Leiden Factor V Deficiency. She has indicated a continued interest in remaining upon warfarin. Should re-visit this issue with patient annually. Currently "with the advice and consent of the patient--after review of risks vs. benefits--she elects to stay upon warfarin.       Provider Role Specialty Phone number   Zoila Shutter, MD  Internal Medicine [phone]        ANTICOAG TODAY: Anticoagulation Summary as of 09/11/2012              INR goal 2.0-3.0     Selected INR 2.0 (09/11/2012) Next INR check 10/09/2012   Weekly max dose (mg)  Target end date Indefinite   Indications FACTOR V DEFICIENCY [286.3], Long term current use of anticoagulant [V58.61]    Anticoagulation Episode Summary              [facility] check location [facility]    Send INR reminders to [facility]   Comments Index VTE (DVT) was 2007. She was subsequently documented as having Leiden Factor V  Deficiency. She has indicated a continued interest in remaining upon warfarin. Should re-visit this issue with patient annually. Currently "with the advice and consent of the patient--after review of risks vs. benefits--she elects to stay upon warfarin.       Provider Role Specialty Phone number   Zoila Shutter, MD  Internal Medicine [phone]        PATIENT INSTRUCTIONS: Patient Instructions  Patient instructed to take medications as defined in the Anti-coagulation Track section of this encounter.  Patient instructed to take today's dose.  Patient verbalized understanding of these instructions.        FOLLOW-UP Return in 4 weeks (on 10/09/2012) for Follow up INR at 0845h.  Hulen Luster, III Pharm.D., CACP

## 2012-09-11 NOTE — Patient Instructions (Signed)
Patient instructed to take medications as defined in the Anti-coagulation Track section of this encounter.  Patient instructed to take today's dose.  Patient verbalized understanding of these instructions.    

## 2012-09-24 ENCOUNTER — Other Ambulatory Visit: Payer: Self-pay | Admitting: Gynecology

## 2012-10-09 ENCOUNTER — Ambulatory Visit (INDEPENDENT_AMBULATORY_CARE_PROVIDER_SITE_OTHER): Payer: Medicare Other | Admitting: Pharmacist

## 2012-10-09 DIAGNOSIS — D682 Hereditary deficiency of other clotting factors: Secondary | ICD-10-CM | POA: Diagnosis not present

## 2012-10-09 DIAGNOSIS — Z7901 Long term (current) use of anticoagulants: Secondary | ICD-10-CM | POA: Diagnosis not present

## 2012-10-09 LAB — POCT INR: INR: 2.7

## 2012-10-09 NOTE — Progress Notes (Signed)
Agree with Dr. Groce's Plan. 

## 2012-10-09 NOTE — Patient Instructions (Signed)
Patient instructed to take medications as defined in the Anti-coagulation Track section of this encounter.  Patient instructed to take today's dose.  Patient verbalized understanding of these instructions.    

## 2012-10-09 NOTE — Progress Notes (Signed)
Anti-Coagulation Progress Note  Judith [name] is a 76 y.o. female who is currently on an anti-coagulation regimen.    RECENT RESULTS: Recent results are below, the most recent result is correlated with a dose of 27.5 mg. per week: Lab Results  Component Value Date   INR 2.70 10/09/2012   INR 2.0 09/11/2012   INR 2.5 08/22/2012    ANTI-COAG DOSE:   Latest dosing instructions   Total Sun Mon Tue Wed Thu Fri Sat   27.5 5 mg 2.5 mg 5 mg 2.5 mg 5 mg 2.5 mg 5 mg    (5 mg1) (5 mg0.5) (5 mg1) (5 mg0.5) (5 mg1) (5 mg0.5) (5 mg1)         ANTICOAG SUMMARY: Anticoagulation Episode Summary              Current INR goal 2.0-3.0 Next INR check 11/06/2012   INR from last check 2.70 (10/09/2012)     Weekly max dose (mg)  Target end date Indefinite   Indications FACTOR V DEFICIENCY [286.3], Long term current use of anticoagulant [V58.61]   [facility]R check location [facility]    Send INR reminders to [facility]   Comments Index VTE (DVT) was 2007. She was subsequently documented as having Leiden Factor V Deficiency. She has indicated a continued interest in remaining upon warfarin. Should re-visit this issue with patient annually. Currently "with the advice and consent of the patient--after review of risks vs. benefits--she elects to stay upon warfarin.       Provider Role Specialty Phone number   Zoila Shutter, MD  Internal Medicine [phone]        ANTICOAG TODAY: Anticoagulation Summary as of 10/09/2012              INR goal 2.0-3.0     Selected INR 2.70 (10/09/2012) Next INR check 11/06/2012   Weekly max dose (mg)  Target end date Indefinite   Indications FACTOR V DEFICIENCY [286.3], Long term current use of anticoagulant [V58.61]    Anticoagulation Episode Summary              [facility] check location [facility]    Send INR reminders to [facility]   Comments Index VTE (DVT) was 2007. She was subsequently documented as having Leiden Factor V  Deficiency. She has indicated a continued interest in remaining upon warfarin. Should re-visit this issue with patient annually. Currently "with the advice and consent of the patient--after review of risks vs. benefits--she elects to stay upon warfarin.       Provider Role Specialty Phone number   Zoila Shutter, MD  Internal Medicine [phone]        PATIENT INSTRUCTIONS: Patient Instructions  Patient instructed to take medications as defined in the Anti-coagulation Track section of this encounter.  Patient instructed to take today's dose.  Patient verbalized understanding of these instructions.        FOLLOW-UP Return in 4 weeks (on 11/06/2012) for Follow up INR at 0845h.  Hulen Luster, III Pharm.D., CACP

## 2012-11-06 ENCOUNTER — Ambulatory Visit: Payer: Medicare Other

## 2012-11-13 ENCOUNTER — Ambulatory Visit (INDEPENDENT_AMBULATORY_CARE_PROVIDER_SITE_OTHER): Payer: Medicare Other | Admitting: Pharmacist

## 2012-11-13 DIAGNOSIS — D682 Hereditary deficiency of other clotting factors: Secondary | ICD-10-CM

## 2012-11-13 DIAGNOSIS — Z7901 Long term (current) use of anticoagulants: Secondary | ICD-10-CM

## 2012-11-13 LAB — POCT INR: INR: 3.8

## 2012-11-13 NOTE — Progress Notes (Signed)
Anti-Coagulation Progress Note  Judith [name] is a 77 y.o. female who is currently on an anti-coagulation regimen.    RECENT RESULTS: Recent results are below, the most recent result is correlated with a dose of 27.5 mg. per week: Lab Results  Component Value Date   INR 3.80 11/13/2012   INR 2.70 10/09/2012   INR 2.0 09/11/2012    ANTI-COAG DOSE:   Latest dosing instructions   Total Sun Mon Tue Wed Thu Fri Sat   25 2.5 mg 5 mg 2.5 mg 5 mg 2.5 mg 5 mg 2.5 mg    (5 mg0.5) (5 mg1) (5 mg0.5) (5 mg1) (5 mg0.5) (5 mg1) (5 mg0.5)         ANTICOAG SUMMARY: Anticoagulation Episode Summary              Current INR goal 2.0-3.0 Next INR check 12/04/2012   INR from last check 3.80! (11/13/2012)     Weekly max dose (mg)  Target end date Indefinite   Indications FACTOR V DEFICIENCY [286.3], Long term current use of anticoagulant [V58.61]   [facility]R check [facility]    Send INR reminders to ANTICOAG IMP   Comments Index VTE (DVT) was 2007. She was subsequently documented as having Leiden Factor V Deficiency. She has indicated a continued interest in remaining upon warfarin. Should re-visit this issue with patient annually. Currently "with the advice and consent of the patient--after review of risks vs. benefits--she elects to stay upon warfarin.       Provider Role Specialty Phone number   Zoila Shutter, MD  Internal Medicine [phone]        ANTICOAG TODAY: Anticoagulation Summary as of 11/13/2012              INR goal 2.0-3.0     Selected INR 3.80! (11/13/2012) Next INR check 12/04/2012   Weekly max dose (mg)  Target end date Indefinite   Indications FACTOR V DEFICIENCY [286.3], Long term current use of anticoagulant [V58.61]    Anticoagulation Episode Summary              IN[facility] check [facility]erred [facility]    Send INR reminders to ANTICOAG [facility]   Comments Index VTE (DVT) was 2007. She was subsequently documented as having Leiden Factor V  Deficiency. She has indicated a continued interest in remaining upon warfarin. Should re-visit this issue with patient annually. Currently "with the advice and consent of the patient--after review of risks vs. benefits--she elects to stay upon warfarin.       Provider Role Specialty Phone number   Zoila Shutter, MD  Internal Medicine [phone]        PATIENT INSTRUCTIONS: Patient Instructions  Patient instructed to take medications as defined in the Anti-coagulation Track section of this encounter.  Patient instructed to OMIT/HOLD today's dose.  Patient verbalized understanding of these instructions.        FOLLOW-UP Return in 3 weeks (on 12/04/2012) for Follow up INR at 0900h.  Hulen Luster, III Pharm.D., CACP

## 2012-11-13 NOTE — Patient Instructions (Signed)
Patient instructed to take medications as defined in the Anti-coagulation Track section of this encounter.  Patient instructed to OMIT/HOLD today's dose.  Patient verbalized understanding of these instructions.    

## 2012-11-14 ENCOUNTER — Encounter: Payer: Medicare Other | Admitting: Internal Medicine

## 2012-11-15 ENCOUNTER — Other Ambulatory Visit: Payer: Self-pay | Admitting: Internal Medicine

## 2012-11-21 ENCOUNTER — Encounter: Payer: Medicare Other | Admitting: Internal Medicine

## 2012-11-23 ENCOUNTER — Emergency Department (HOSPITAL_COMMUNITY)
Admission: EM | Admit: 2012-11-23 | Discharge: 2012-11-23 | Disposition: A | Discharge status: Home / Self Care | Payer: Medicare Other | Attending: Emergency Medicine | Admitting: Emergency Medicine

## 2012-11-23 ENCOUNTER — Emergency Department (HOSPITAL_COMMUNITY): Payer: Medicare Other

## 2012-11-23 ENCOUNTER — Encounter (HOSPITAL_COMMUNITY): Payer: Self-pay | Admitting: *Deleted

## 2012-11-23 DIAGNOSIS — R05 Cough: Secondary | ICD-10-CM | POA: Insufficient documentation

## 2012-11-23 DIAGNOSIS — Z86718 Personal history of other venous thrombosis and embolism: Secondary | ICD-10-CM | POA: Insufficient documentation

## 2012-11-23 DIAGNOSIS — J40 Bronchitis, not specified as acute or chronic: Secondary | ICD-10-CM | POA: Insufficient documentation

## 2012-11-23 DIAGNOSIS — J209 Acute bronchitis, unspecified: Secondary | ICD-10-CM | POA: Diagnosis not present

## 2012-11-23 DIAGNOSIS — Z8542 Personal history of malignant neoplasm of other parts of uterus: Secondary | ICD-10-CM | POA: Diagnosis not present

## 2012-11-23 DIAGNOSIS — E785 Hyperlipidemia, unspecified: Secondary | ICD-10-CM | POA: Diagnosis not present

## 2012-11-23 DIAGNOSIS — Z79899 Other long term (current) drug therapy: Secondary | ICD-10-CM | POA: Insufficient documentation

## 2012-11-23 DIAGNOSIS — D6859 Other primary thrombophilia: Secondary | ICD-10-CM | POA: Diagnosis not present

## 2012-11-23 DIAGNOSIS — R059 Cough, unspecified: Secondary | ICD-10-CM | POA: Diagnosis not present

## 2012-11-23 DIAGNOSIS — J069 Acute upper respiratory infection, unspecified: Secondary | ICD-10-CM | POA: Diagnosis not present

## 2012-11-23 DIAGNOSIS — Z90710 Acquired absence of both cervix and uterus: Secondary | ICD-10-CM | POA: Diagnosis not present

## 2012-11-23 DIAGNOSIS — Z86711 Personal history of pulmonary embolism: Secondary | ICD-10-CM | POA: Diagnosis not present

## 2012-11-23 DIAGNOSIS — R0602 Shortness of breath: Secondary | ICD-10-CM | POA: Diagnosis not present

## 2012-11-23 DIAGNOSIS — Z8709 Personal history of other diseases of the respiratory system: Secondary | ICD-10-CM | POA: Insufficient documentation

## 2012-11-23 DIAGNOSIS — Z8719 Personal history of other diseases of the digestive system: Secondary | ICD-10-CM | POA: Diagnosis not present

## 2012-11-23 DIAGNOSIS — M810 Age-related osteoporosis without current pathological fracture: Secondary | ICD-10-CM | POA: Diagnosis not present

## 2012-11-23 DIAGNOSIS — Z7901 Long term (current) use of anticoagulants: Secondary | ICD-10-CM | POA: Insufficient documentation

## 2012-11-23 LAB — CBC WITH DIFFERENTIAL/PLATELET
Basophils Absolute: 0 10*3/uL (ref 0.0–0.1)
Basophils Relative: 1 % (ref 0–1)
Eosinophils Absolute: 0.2 10*3/uL (ref 0.0–0.7)
Eosinophils Relative: 4 % (ref 0–5)
HCT: 38.6 % (ref 36.0–46.0)
Hemoglobin: 13.2 g/dL (ref 12.0–15.0)
Lymphocytes Relative: 24 % (ref 12–46)
Lymphs Abs: 1.3 10*3/uL (ref 0.7–4.0)
MCH: 33.2 pg (ref 26.0–34.0)
MCHC: 34.2 g/dL (ref 30.0–36.0)
MCV: 97 fL (ref 78.0–100.0)
Monocytes Absolute: 1 10*3/uL (ref 0.1–1.0)
Monocytes Relative: 19 % — ABNORMAL HIGH (ref 3–12)
Neutro Abs: 3 10*3/uL (ref 1.7–7.7)
Neutrophils Relative %: 54 % (ref 43–77)
Platelets: 277 10*3/uL (ref 150–400)
RBC: 3.98 MIL/uL (ref 3.87–5.11)
RDW: 13 % (ref 11.5–15.5)
WBC: 5.6 10*3/uL (ref 4.0–10.5)

## 2012-11-23 LAB — BASIC METABOLIC PANEL
BUN: 22 mg/dL (ref 6–23)
CO2: 24 mEq/L (ref 19–32)
Calcium: 9.7 mg/dL (ref 8.4–10.5)
Chloride: 105 mEq/L (ref 96–112)
Creatinine, Ser: 0.83 mg/dL (ref 0.50–1.10)
GFR calc Af Amer: 79 mL/min — ABNORMAL LOW (ref >90)
GFR calc non Af Amer: 68 mL/min — ABNORMAL LOW (ref >90)
Glucose, Bld: 86 mg/dL (ref 70–99)
Potassium: 4.4 mEq/L (ref 3.5–5.1)
Sodium: 141 mEq/L (ref 135–145)

## 2012-11-23 LAB — PROTIME-INR
INR: 2.15 — ABNORMAL HIGH (ref 0.00–1.49)
Prothrombin Time: 23.1 seconds — ABNORMAL HIGH (ref 11.6–15.2)

## 2012-11-23 MED ORDER — ALBUTEROL SULFATE (5 MG/ML) 0.5% IN NEBU
5.0000 mg | INHALATION_SOLUTION | Freq: Once | RESPIRATORY_TRACT | Status: AC
  Administered 2012-11-23: 5 mg via RESPIRATORY_TRACT
  Filled 2012-11-23: qty 1

## 2012-11-23 MED ORDER — PROMETHAZINE-DM 6.25-15 MG/5ML PO SYRP: 5.0000 mL | ORAL_SOLUTION | Freq: Four times a day (QID) | ORAL | Status: DC | PRN

## 2012-11-23 MED ORDER — PREDNISONE 20 MG PO TABS
60.0000 mg | ORAL_TABLET | Freq: Once | ORAL | Status: AC
  Administered 2012-11-23: 60 mg via ORAL
  Filled 2012-11-23: qty 3

## 2012-11-23 MED ORDER — PREDNISONE 50 MG PO TABS: 50.0000 mg | ORAL_TABLET | Freq: Every day | ORAL | Status: DC

## 2012-11-23 MED ORDER — IPRATROPIUM BROMIDE 0.02 % IN SOLN
0.5000 mg | Freq: Once | RESPIRATORY_TRACT | Status: AC
  Administered 2012-11-23: 0.5 mg via RESPIRATORY_TRACT
  Filled 2012-11-23: qty 2.5

## 2012-11-23 MED ORDER — ALBUTEROL SULFATE HFA 108 (90 BASE) MCG/ACT IN AERS: 2.0000 | INHALATION_SPRAY | RESPIRATORY_TRACT | Status: DC | PRN

## 2012-11-23 MED ORDER — GUAIFENESIN ER 1200 MG PO TB12: 1.0000 | ORAL_TABLET | Freq: Two times a day (BID) | ORAL | Status: DC

## 2012-11-23 NOTE — ED Notes (Signed)
Pt to XR

## 2012-11-23 NOTE — ED Provider Notes (Signed)
Medical screening examination/treatment/procedure(s) were conducted as a shared visit with non-physician practitioner(s) and myself.  I personally evaluated the patient during the encounter  Doug Sou, MD 11/23/12 [phone]

## 2012-11-23 NOTE — ED Notes (Signed)
Pt. Has c/o SOB and had a sick contact at home.  Pt. reports feeling weak over the weekend and coughing has today.

## 2012-11-23 NOTE — ED Provider Notes (Signed)
Complains of generalized weakness, nonproductive cough onset approximately 5 days ago. Symptoms accompanied by mild shortness of breath. She was recently exposed her granddaughter who had a respiratory illness. No treatment prior to coming here. On exam no distress speaks in paragraphs lungs clear auscultation, occasional cough  Doug Sou, MD 11/23/12 [phone]

## 2012-11-23 NOTE — ED Provider Notes (Signed)
History     CSN: [id number]  Arrival date & time 11/23/12  1191   First MD Initiated Contact with Patient 11/23/12 [phone]      Chief Complaint  Patient presents with  . Shortness of Breath  . Cough    (Consider location/radiation/quality/duration/timing/severity/associated sxs/prior treatment) HPI Patient presents to the emergency department today complaining of cough and shortness of breath that started last night. She began feeling weak with a cough 4-5 days ago, after she was exposed to her sick granddaughter. She has a history of DVT and is currently on warfarin. She says that she has trouble breathing when she ambulates. She felt weak and wobbly and wanted to come in for treatment before she fell or something worse happened. No treatment before arriving here. Denies chest pain, fever, chills, wheezes, myalgias, sinus congestion, headache, nausea, and vomiting.   Past Medical History  Diagnosis Date  . Hypertension   . Hyperlipidemia   . Pulmonary embolus 2007    and DVT, on chronic anti-coagulation  . Shortness of breath 2011    Thought to be secondary to weight and deconditioning, negative cardiac and pulmonary evaluation: Lexiscan ETT, Echo  (EF nl., +LVH),-CXR, nl PFT's  . Osteoporosis 01/2012    t score -2.5 forearm  . Cancer 1983, 2006    , s/p XRT, partial hysterectomy and coloscopy, then upper vaginectomy  . Stress incontinence, female   . Chronic sinusitis   . Stress incontinence, female   . DVT (deep vein thrombosis) in pregnancy     Both lower extremities  . Acid reflux   . Heterozygous factor V Leiden mutation   . Adenocarcinoma of uterus 83  . CIN II (cervical intraepithelial neoplasia II)     Past Surgical History  Procedure Date  . Cleft palate repair   . Nasal septum repair   . Tubal ligation   . Hammer toe surgery 1990-91    3 toes each foot  . Cervix removal   . Colonoscopy 09/22/2011    Procedure: COLONOSCOPY;  Surgeon: Charna Elizabeth, MD;   Location: [facility];  Service: Endoscopy;  Laterality: N/A;    Family History  Problem Relation Age of Onset  . Heart disease Sister   . Atrial fibrillation Sister   . Hypertension Sister   . Cancer Maternal Aunt     History  Substance Use Topics  . Smoking status: Never Smoker   . Smokeless tobacco: Never Used  . Alcohol Use: No    OB History    Grav Para Term Preterm Abortions TAB SAB Ect Mult Living   2 1        1       Review of Systems All other systems negative except as documented in the HPI. All pertinent positives and negatives as reviewed in the HPI.  Allergies  Penicillins and Sulfonamide derivatives  Home Medications   Current Outpatient Rx  Name  Route  Sig  Dispense  Refill  . ALENDRONATE SODIUM 70 MG PO TABS   Oral   Take 70 mg by mouth once a week. Sundays         . AMLODIPINE BESYLATE 10 MG PO TABS   Oral   Take 10 mg by mouth daily.         Marland Kitchen CALTRATE 600+D PLUS PO   Oral   Take 1 tablet by mouth daily.          Marland Kitchen VITAMIN D 1000 UNITS PO TABS   Oral  Take 1,000 Units by mouth daily.          Marland Kitchen FAMOTIDINE 20 MG PO TABS   Oral   Take 20 mg by mouth every evening.         Marland Kitchen FEXOFENADINE HCL 180 MG PO TABS   Oral   Take 180 mg by mouth daily as needed. For allergies         . MOVE FREE PO   Oral   Take 1 tablet by mouth daily.          Marland Kitchen LISINOPRIL 20 MG PO TABS   Oral   Take 1 tablet (20 mg total) by mouth daily.   30 tablet   11   . CENTRUM PO TABS   Oral   Take 1 tablet by mouth daily.          Marland Kitchen NAPROXEN SODIUM 220 MG PO TABS   Oral   Take 220 mg by mouth 2 (two) times daily as needed. For pain         . PRAVASTATIN SODIUM 20 MG PO TABS   Oral   Take 20 mg by mouth daily.         Marland Kitchen PSEUDOEPHEDRINE HCL 30 MG PO TABS   Oral   Take 30 mg by mouth daily as needed. For allergies         . SOLIFENACIN SUCCINATE 10 MG PO TABS   Oral   Take 10 mg by mouth daily.         . WARFARIN SODIUM 5 MG  PO TABS   Oral   Take 2.5-5 mg by mouth every evening. Take 0.5 tablet on Tues, Thur, and Sat and 1 tablet on Sun, Mon, Wed, Fri.           BP 159/65  Pulse 89  Temp 97.8 F (36.6 C) (Oral)  Resp 20  SpO2 100%  Physical Exam  Constitutional: She is oriented to person, place, and time. She appears well-developed and well-nourished. No distress.  HENT:  Head: Normocephalic and atraumatic.  Mouth/Throat: Oropharynx is clear and moist. No oropharyngeal exudate.  Eyes: Conjunctivae normal are normal. Pupils are equal, round, and reactive to light.  Neck: Normal range of motion. Neck supple. No tracheal deviation present.  Cardiovascular: Normal rate, regular rhythm and normal heart sounds.   Pulmonary/Chest: Breath sounds normal. No respiratory distress.  Lymphadenopathy:    She has no cervical adenopathy.  Neurological: She is alert and oriented to person, place, and time.  Skin: Skin is warm and dry.    ED Course  Procedures (including critical care time)  Labs Reviewed  PROTIME-INR - Abnormal; Notable for the following:    Prothrombin Time 23.1 (*)     INR 2.15 (*)     All other components within normal limits  BASIC METABOLIC PANEL - Abnormal; Notable for the following:    GFR calc non Af Amer 68 (*)     GFR calc Af Amer 79 (*)     All other components within normal limits  CBC WITH DIFFERENTIAL - Abnormal; Notable for the following:    Monocytes Relative 19 (*)     All other components within normal limits    Spoke to patient after breathing treatment who remains stable. The patient will be treated for URI and cough. The patient was around a sick grandchild. The patient is asked to follow up with her PCP.   MDM  MDM Reviewed: nursing note and vitals Interpretation:  labs and x-ray             Carlyle Dolly, PA-C 11/23/12 1111

## 2012-11-27 ENCOUNTER — Ambulatory Visit (INDEPENDENT_AMBULATORY_CARE_PROVIDER_SITE_OTHER): Payer: Medicare Other | Admitting: Adult Health

## 2012-11-27 ENCOUNTER — Encounter: Payer: Self-pay | Admitting: Adult Health

## 2012-11-27 VITALS — BP 120/70 | HR 76 | Temp 97.8°F | Ht 64.0 in | Wt 221.6 lb

## 2012-11-27 DIAGNOSIS — J069 Acute upper respiratory infection, unspecified: Secondary | ICD-10-CM

## 2012-11-27 MED ORDER — AZITHROMYCIN 250 MG PO TABS: ORAL_TABLET | ORAL | Status: AC

## 2012-11-27 NOTE — Patient Instructions (Addendum)
Zpack take as directed.  Mucinex DM Twice daily  As needed  Cough/congestion  Fluids and rest  Discuss with your family doctor if cough does not go away as Lisinopril may keep aggravate cough.  Please contact office for sooner follow up if symptoms do not improve or worsen or seek emergency care  Follow up Dr. Shelle Iron as needed.

## 2012-11-28 ENCOUNTER — Other Ambulatory Visit: Payer: Self-pay | Admitting: Gynecology

## 2012-12-01 DIAGNOSIS — J069 Acute upper respiratory infection, unspecified: Secondary | ICD-10-CM | POA: Insufficient documentation

## 2012-12-01 NOTE — Progress Notes (Signed)
  Subjective:    Patient ID: Judith [name], female    DOB: 1936/11/03, 77 y.o.   MRN: [id number]  HPI 77 yo female never smoker seen previously for dysnea w/ no obstruction noted on PFTs.   11/27/12 Acute OV  Complains of  productive cough wiht green phlegm, pain under right shoulder blade, ear pain and ringing, increased SOB all x 5 days.  Last seen 11-2010. Seen in ER 1/16 , CXR w /no acute process. Labs unrevealing .  Given Neb tx . No rx for abx.  Cough is getting worse with over last 3 days.  No hemoptysis, chest pain or edema.  Of note on ACE but says she was doing fine w/out cough until last 1 week.  On chronic coumadin w/ therapeutic INR at ER visit.   Review of Systems Constitutional:   No  weight loss, night sweats,  Fevers, chills, + fatigue, or  lassitude.  HEENT:   No headaches,  Difficulty swallowing,  Tooth/dental problems, or  Sore throat,                No sneezing, itching, ear ache,  +nasal congestion, post nasal drip,   CV:  No chest pain,  Orthopnea, PND, swelling in lower extremities, anasarca, dizziness, palpitations, syncope.   GI  No heartburn, indigestion, abdominal pain, nausea, vomiting, diarrhea, change in bowel habits, loss of appetite, bloody stools.   Resp:  No coughing up of blood.   Marland Kitchen  No chest wall deformity  Skin: no rash or lesions.  GU: no dysuria, change in color of urine, no urgency or frequency.  No flank pain, no hematuria   MS:  No joint pain or swelling.  No decreased range of motion.  No back pain.  Psych:  No change in mood or affect. No depression or anxiety.  No memory loss.          Objective:   Physical Exam GEN: A/Ox3; pleasant , NAD elderly   HEENT:  [place]/AT,  EACs-clear, TMs-wnl, NOSE-clear drainage  THROAT-clear, no lesions, no postnasal drip or exudate noted.   NECK:  Supple w/ fair ROM; no JVD; normal carotid impulses w/o bruits; no thyromegaly or nodules palpated; no lymphadenopathy.  RESP  Clear  P & A; w/o,  wheezes/ rales/ or rhonchi.no accessory muscle use, no dullness to percussion  CARD:  RRR, no m/r/g  , no peripheral edema, pulses intact, no cyanosis or clubbing.  GI:   Soft & nt; nml bowel sounds; no organomegaly or masses detected.  Musco: Warm bil, no deformities or joint swelling noted.   Neuro: alert, no focal deficits noted.    Skin: Warm, no lesions or rashes         Assessment & Plan:

## 2012-12-01 NOTE — Progress Notes (Signed)
Ov reviewed, and agree with plan as outlined.  

## 2012-12-01 NOTE — Assessment & Plan Note (Signed)
Slow to resolve flare w/ cough    Plan  Zpack take as directed.  Mucinex DM Twice daily  As needed  Cough/congestion  Fluids and rest  Discuss with your family doctor if cough does not go away as Lisinopril may keep aggravate cough.  Please contact office for sooner follow up if symptoms do not improve or worsen or seek emergency care  Follow up Dr. Shelle Iron as needed.

## 2012-12-04 ENCOUNTER — Ambulatory Visit (INDEPENDENT_AMBULATORY_CARE_PROVIDER_SITE_OTHER): Payer: Medicare Other | Admitting: Pharmacist

## 2012-12-04 DIAGNOSIS — Z7901 Long term (current) use of anticoagulants: Secondary | ICD-10-CM

## 2012-12-04 DIAGNOSIS — D682 Hereditary deficiency of other clotting factors: Secondary | ICD-10-CM

## 2012-12-04 LAB — POCT INR: INR: 2.4

## 2012-12-04 NOTE — Progress Notes (Signed)
Anti-Coagulation Progress Note  Judith [name] is a 77 y.o. female who is currently on an anti-coagulation regimen.    RECENT RESULTS: Recent results are below, the most recent result is correlated with a dose of 25 mg. per week: Lab Results  Component Value Date   INR 2.40 12/04/2012   INR 2.15* 11/23/2012   INR 3.80 11/13/2012    ANTI-COAG DOSE:   Latest dosing instructions   Total Sun Mon Tue Wed Thu Fri Sat   25 2.5 mg 5 mg 2.5 mg 5 mg 2.5 mg 5 mg 2.5 mg    (5 mg0.5) (5 mg1) (5 mg0.5) (5 mg1) (5 mg0.5) (5 mg1) (5 mg0.5)         ANTICOAG SUMMARY: Anticoagulation Episode Summary              Current INR goal 2.0-3.0 Next INR check 01/01/2013   INR from last check 3.80! (11/13/2012) Most recent INR 2.40 (12/04/2012)   Weekly max dose (mg)  Target end date Indefinite   Indications FACTOR V DEFICIENCY [286.3], Long term current use of anticoagulant [V58.61]   [facility]    Send INR reminders to ANTICOAG [facility]   Comments Index VTE (DVT) was 2007. She was subsequently documented as having Leiden Factor V Deficiency. She has indicated a continued interest in remaining upon warfarin. Should re-visit this issue with patient annually. Currently "with the advice and consent of the patient--after review of risks vs. benefits--she elects to stay upon warfarin.       Provider Role Specialty Phone number   Zoila Shutter, MD  Internal Medicine [phone]        ANTICOAG TODAY: Anticoagulation Summary as of 12/04/2012              INR goal 2.0-3.0     Selected INR  Next INR check 01/01/2013   Weekly max dose (mg)  Target end date Indefinite   Indications FACTOR V DEFICIENCY [286.3], Long term current use of anticoagulant [V58.61]    Anticoagulation Episode Summary              [facility] check [facility]    Send INR reminders to [facility]TICOAG [facility]   Comments Index VTE (DVT) was 2007. She was subsequently documented as having  Leiden Factor V Deficiency. She has indicated a continued interest in remaining upon warfarin. Should re-visit this issue with patient annually. Currently "with the advice and consent of the patient--after review of risks vs. benefits--she elects to stay upon warfarin.       Provider Role Specialty Phone number   Zoila Shutter, MD  Internal Medicine [phone]        PATIENT INSTRUCTIONS: Patient Instructions  Patient instructed to take medications as defined in the Anti-coagulation Track section of this encounter.  Patient instructed to take today's dose.  Patient verbalized understanding of these instructions.        FOLLOW-UP Return in 4 weeks (on 01/01/2013) for Follow up INR at 0845h.  Hulen Luster, III Pharm.D., CACP

## 2012-12-04 NOTE — Patient Instructions (Signed)
Patient instructed to take medications as defined in the Anti-coagulation Track section of this encounter.  Patient instructed to take today's dose.  Patient verbalized understanding of these instructions.    

## 2012-12-12 ENCOUNTER — Other Ambulatory Visit: Payer: Self-pay | Admitting: Internal Medicine

## 2012-12-19 ENCOUNTER — Encounter: Payer: Medicare Other | Admitting: Internal Medicine

## 2013-01-01 ENCOUNTER — Encounter: Payer: Self-pay | Admitting: Internal Medicine

## 2013-01-01 ENCOUNTER — Ambulatory Visit: Payer: Medicare Other

## 2013-01-02 ENCOUNTER — Encounter: Payer: Self-pay | Admitting: Internal Medicine

## 2013-01-02 ENCOUNTER — Ambulatory Visit (INDEPENDENT_AMBULATORY_CARE_PROVIDER_SITE_OTHER): Payer: Medicare Other | Admitting: Internal Medicine

## 2013-01-02 VITALS — BP 144/74 | HR 76 | Temp 96.7°F | Ht 63.0 in | Wt 221.5 lb

## 2013-01-02 DIAGNOSIS — Z79899 Other long term (current) drug therapy: Secondary | ICD-10-CM | POA: Diagnosis not present

## 2013-01-02 DIAGNOSIS — D682 Hereditary deficiency of other clotting factors: Secondary | ICD-10-CM | POA: Diagnosis not present

## 2013-01-02 DIAGNOSIS — I10 Essential (primary) hypertension: Secondary | ICD-10-CM | POA: Diagnosis not present

## 2013-01-02 LAB — POCT INR: INR: 2.4

## 2013-01-02 NOTE — Progress Notes (Addendum)
Patient ID: Judith [name], female   DOB: [birth year], 77 y.o.   MRN: [id number]   Subjective:   Patient ID: Judith [name] female   DOB: 01-19-1936 77 y.o.   MRN: [id number]  HPI: Ms.Judith [name] is a 76 y.o. lady with past medical history significant for DVT in pregnancy and PE in 2007 and she is on chronic anticoagulation with coumadin with Dr. Michaell Cowing. She comes in for an INR check today. She also wants to clarify her medications with me because she thinks they are too many. She denies any new complaints. She is not dizzy any more as she complained about last visit. She is regularly taking all her medications. Her cough which was an issue in a previous visit has resolved. She denies any chest pains, palpitations or new shortness of breath.     PAST MEDICAL HISTORY  has a past medical history of Hypertension; Hyperlipidemia; Pulmonary embolus (2007); Shortness of breath (2011); Osteoporosis (01/2012); Cancer (1983, 2006); Stress incontinence, female; Chronic sinusitis; DVT (deep vein thrombosis) in pregnancy; Acid reflux; Heterozygous factor V Leiden mutation; Adenocarcinoma of uterus (83); and CIN II (cervical intraepithelial neoplasia II).  MEDICATIONS Alendronate 70 mg weekly Amlodipine 10 mg daily Hydrochlorothiazide 25 mg daily, Lisinopril 20 mg daily Calcium and Vitamin D  Cholecalciferol 1000 units daily Chondritin Glucosamine daily Solifenacin 10 mg daily Pravastatin 20 mg daily Vitamin C  Warfarin as directed by Dr Alexandria Lodge.   FAMILY AND SOCIAL HISTORY Family History  Problem Relation Age of Onset  . Heart disease Sister   . Atrial fibrillation Sister   . Hypertension Sister   . Cancer Maternal Aunt    History   Social History  . Marital Status: Married    Spouse Name: N/A    Number of Children: N/A  . Years of Education: N/A   Social History Main Topics  . Smoking status: Never Smoker   . Smokeless tobacco: Never Used  . Alcohol Use: No  . Drug Use: No  . Sexually  Active: Yes    Birth Control/ Protection: Post-menopausal   Other Topics Concern  . None   Social History Narrative   Married, for the second time. She retired from [employer], she was an Chief Financial Officer. 2 sons from first marriage (1 was schizophrenic and committed suicide, the other lives out of state and is doing well).   Husband has 2 sons from previous marriage.    REVIEW OF SYSTEMS Constitutional: Denies fever, chills, diaphoresis.  Respiratory: Denies SOB, DOE, cough, chest tightness,  and wheezing.   Cardiovascular: Denies chest pain, palpitations and leg swelling.  Gastrointestinal: Denies nausea, vomiting, abdominal pain, diarrhea, constipation. Genitourinary: Denies difficulty urinating.  Musculoskeletal: Denies myalgias. Neurological: Denies dizziness, syncope, light-headedness, numbness and headaches.    Objective:  Physical Exam: Filed Vitals:   01/02/13 0906  BP: 144/74  Pulse: 76  Temp: 96.7 F (35.9 C)  TempSrc: Oral  Height: 5\' 3"  (1.6 m)  Weight: 221 lb 8 oz (100.472 kg)  SpO2: 99%   Vitals reviewed.  General: Comfortably sitting in chair HEENT: PERRL, EOMI, no scleral icterus. Heart: RRR, no rubs, murmurs or gallops. Lungs: Clear to auscultation bilaterally, no wheezes, rales, or rhonchi. Abdomen: Soft, nontender, nondistended, BS present. Extremities: Warm, no pedal edema. Neuro: Alert and oriented X3, cranial nerves II-XII grossly intact,  strength and sensation to light touch equal in bilateral upper and lower extremities   Assessment & Plan:   1. MEDICATION MANAGEMENT - We reviewed the patient's medications today and  cleaned up the list in our records. She is compliant with all her medications and is able to recall them almost adequately. We went over the side effects of certain medications like Ramipril, Amlodipine, and Pseudoephedrine. She said she does not take any B12 injections any more because she was told she had adequate levels. She  continues to take Vitamin D pills. Next visit we will check her B12 and Vitamin D levels just to make sure.   2. ANTICOAGULATION - An INR check was done today. The patient is at goal.  Lab Results  Component Value Date   INR 2.4 01/02/2013   INR 2.40 12/04/2012   INR 2.15* 11/23/2012   3. HYPERTENSION - Blood pressure was slightly elevated, but since it was normal in the last visit, I will refrain from changing any medications.   4. PREVENTION - Uptodate, Counseled for weight loss.   NEXT VISIT in 3 months  We revisit BP, do B12 and Vitamin D levels, and lipid profile.

## 2013-01-02 NOTE — Patient Instructions (Signed)
[name], [name] are doing very well. Blood pressure is a bit high but will not increase any medications today as it was good last visit.   BP Readings from Last 3 Encounters:  01/02/13 144/74  11/27/12 120/70  11/23/12 184/95   We have done INR today.   Next visit in 3 months.   Thanks Riddhi Grether 9:46 AM

## 2013-01-16 ENCOUNTER — Other Ambulatory Visit (HOSPITAL_COMMUNITY)
Admission: RE | Admit: 2013-01-16 | Discharge: 2013-01-16 | Disposition: A | Discharge status: Home / Self Care | Payer: Medicare Other | Source: Ambulatory Visit | Attending: Gynecology | Admitting: Gynecology

## 2013-01-16 ENCOUNTER — Encounter: Payer: Self-pay | Admitting: Gynecology

## 2013-01-16 ENCOUNTER — Encounter: Payer: Medicare Other | Admitting: Gynecology

## 2013-01-16 ENCOUNTER — Ambulatory Visit (INDEPENDENT_AMBULATORY_CARE_PROVIDER_SITE_OTHER): Payer: Medicare Other | Admitting: Gynecology

## 2013-01-16 VITALS — BP 140/76 | Ht 63.0 in | Wt 219.0 lb

## 2013-01-16 DIAGNOSIS — N952 Postmenopausal atrophic vaginitis: Secondary | ICD-10-CM

## 2013-01-16 DIAGNOSIS — Z124 Encounter for screening for malignant neoplasm of cervix: Secondary | ICD-10-CM | POA: Insufficient documentation

## 2013-01-16 DIAGNOSIS — M810 Age-related osteoporosis without current pathological fracture: Secondary | ICD-10-CM

## 2013-01-16 DIAGNOSIS — C549 Malignant neoplasm of corpus uteri, unspecified: Secondary | ICD-10-CM

## 2013-01-16 DIAGNOSIS — R32 Unspecified urinary incontinence: Secondary | ICD-10-CM | POA: Diagnosis not present

## 2013-01-16 DIAGNOSIS — C541 Malignant neoplasm of endometrium: Secondary | ICD-10-CM

## 2013-01-16 NOTE — Addendum Note (Signed)
Addended by: Dayna Barker on: 01/16/2013 12:39 PM   Modules accepted: Orders

## 2013-01-16 NOTE — Patient Instructions (Signed)
Stop Fosamax at the mouth of the pills as we discussed. Followup with me in one year.

## 2013-01-16 NOTE — Progress Notes (Signed)
Judith [name] [birth year] [id number]        77 y.o.  G2P2001 for followup exam.  History is remarkable for adenocarcinoma of the uterus stage II status post radiation treatment in 1983. History of DVT and pulmonary embolus in 2007 found to be heterologous for Leiden factor V. 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 De Blanch 2006. History of osteoporosis on Fosamax.  Past medical history,surgical history, medications, allergies, family history and social history were all reviewed and documented in the EPIC chart. ROS:  Was performed and pertinent positives and negatives are included in the history.  Exam: [name] assistant Filed Vitals:   01/16/13 1152  BP: 140/76  Height: 5\' 3"  (1.6 m)  Weight: 219 lb (99.338 kg)   General appearance  Normal Skin grossly normal Head/Neck normal with no cervical or supraclavicular adenopathy thyroid normal Lungs  clear Cardiac RR, without RMG Abdominal  soft, nontender, without masses, organomegaly or hernia Breasts  examined lying and sitting without masses, retractions, discharge or axillary adenopathy. Pelvic  Ext/BUS/vagina  with atrophic changes. Shortened as noted previously. Pap smear of cuff done.    Bimanual without masses or tenderness  Anus and perineum  normal   Rectovaginal  normal sphincter tone without palpated masses or tenderness.    Assessment/Plan:  77 y.o. G73P2001 female for followup exam.   1. History of stage II adenocarcinoma of the endometrium status post radiation 1983. Subsequent trachelectomy upper vaginectomy by Dr. De Blanch 2006. Exam today shows atrophic changes and shortened vagina otherwise NED. Pap of cuff done. 2. Osteoporosis. DEXA 01/2012 T score -2.5. Improved since prior DEXA 2006. Has been on Fosamax since 2006/7. Discussed options for drug-free holiday and she agrees we'll stop Fosamax now planned DEXA in another 2 years. Increase calcium vitamin  D reviewed. 3. Mammography 11/2011. Patient needs to schedule and knows to do so. SBE monthly reviewed. 4. Mild stress incontinence. Stable over years time. Not overly bothersome to the patient. Plan observation at present given her history. 5. Colonoscopy 2012. Repeat at their recommended interval. 6. Health maintenance. Patient being followed for a number of issues and actively sees her primary physician. The blood work done today as it is all done through their office. Followup in one year, sooner as needed.     [name] MD, 12:23 PM 01/16/2013

## 2013-01-17 LAB — URINALYSIS W MICROSCOPIC + REFLEX CULTURE
Bacteria, UA: NONE SEEN
Bilirubin Urine: NEGATIVE
Casts: NONE SEEN
Glucose, UA: NEGATIVE mg/dL
Hgb urine dipstick: NEGATIVE
Ketones, ur: NEGATIVE mg/dL
Leukocytes, UA: NEGATIVE
Nitrite: NEGATIVE
Protein, ur: NEGATIVE mg/dL
Specific Gravity, Urine: 1.01 (ref 1.005–1.030)
Squamous Epithelial / LPF: NONE SEEN
Urobilinogen, UA: 0.2 mg/dL (ref 0.0–1.0)
pH: 6 (ref 5.0–8.0)

## 2013-02-05 ENCOUNTER — Ambulatory Visit (INDEPENDENT_AMBULATORY_CARE_PROVIDER_SITE_OTHER): Payer: Medicare Other | Admitting: Pharmacist

## 2013-02-05 ENCOUNTER — Other Ambulatory Visit: Payer: Self-pay | Admitting: Internal Medicine

## 2013-02-05 DIAGNOSIS — Z7901 Long term (current) use of anticoagulants: Secondary | ICD-10-CM

## 2013-02-05 DIAGNOSIS — D682 Hereditary deficiency of other clotting factors: Secondary | ICD-10-CM

## 2013-02-05 LAB — POCT INR: INR: 3.1

## 2013-02-05 NOTE — Patient Instructions (Signed)
Patient instructed to take medications as defined in the Anti-coagulation Track section of this encounter.  Patient instructed to take today's dose.  Patient verbalized understanding of these instructions.    

## 2013-02-05 NOTE — Progress Notes (Signed)
DVT in 2007, heterozygous for factor V Leiden, pt. wishes to continue anticoagulation.  INR above target.  Agree with Dr. Saralyn Pilar assessment and plan as documented.

## 2013-02-05 NOTE — Progress Notes (Signed)
Anti-Coagulation Progress Note  Judith [name] is a 77 y.o. female who is currently on an anti-coagulation regimen.    RECENT RESULTS: Recent results are below, the most recent result is correlated with a dose of 25 mg. per week: Lab Results  Component Value Date   INR 3.10 02/05/2013   INR 2.4 01/02/2013   INR 2.40 12/04/2012    ANTI-COAG DOSE: Anticoagulation Dose Instructions as of 02/05/2013     [name] Tue Wed Thu Fri Sat   New Dose 2.5 mg 2.5 mg 2.5 mg 5 mg 2.5 mg 5 mg 2.5 mg       ANTICOAG SUMMARY: Anticoagulation Episode Summary   Current INR goal 2.0-3.0  Next INR check 03/05/2013  INR from last check 3.10! (02/05/2013)  Weekly max dose   Target end date Indefinite  [facility]R check location [facility]  Preferred lab   Send INR reminders to [facility]   Indications  FACTOR V DEFICIENCY [286.3] Long term current use of anticoagulant [V58.61]        Comments Index VTE (DVT) was 2007. She was subsequently documented as having Leiden Factor V Deficiency. She has indicated a continued interest in remaining upon warfarin. Should re-visit this issue with patient annually. Currently "with the advice and consent of the patient--after review of risks vs. benefits--she elects to stay upon warfarin.      Anticoagulation Care Providers   Provider Role Specialty Phone number   Zoila Shutter, MD  Internal Medicine [phone]      ANTICOAG TODAY: Anticoagulation Summary as of 02/05/2013   INR goal 2.0-3.0  Selected INR 3.10! (02/05/2013)  Next INR check 03/05/2013  Target end date Indefinite   Indications  FACTOR V DEFICIENCY [286.3] Long term current use of anticoagulant [V58.61]      Anticoagulation Episode Summary   [facility] check location [facility]   Preferred lab    Send INR reminders to [facility]   Comments Index VTE (DVT) was 2007. She was subsequently documented as having Leiden Factor V Deficiency. She has indicated a continued interest in remaining upon  warfarin. Should re-visit this issue with patient annually. Currently "with the advice and consent of the patient--after review of risks vs. benefits--she elects to stay upon warfarin.    Anticoagulation Care Providers   Provider Role Specialty Phone number   Zoila Shutter, MD  Internal Medicine [phone]      PATIENT INSTRUCTIONS: Patient Instructions  Patient instructed to take medications as defined in the Anti-coagulation Track section of this encounter.  Patient instructed to take today's dose.  Patient verbalized understanding of these instructions.       FOLLOW-UP Return in 4 weeks (on 03/05/2013) for Follow up INR at 0845h.  Hulen Luster, III Pharm.D., CACP

## 2013-02-08 ENCOUNTER — Other Ambulatory Visit: Payer: Self-pay | Admitting: Internal Medicine

## 2013-03-05 ENCOUNTER — Ambulatory Visit (INDEPENDENT_AMBULATORY_CARE_PROVIDER_SITE_OTHER): Payer: Medicare Other | Admitting: Pharmacist

## 2013-03-05 DIAGNOSIS — D682 Hereditary deficiency of other clotting factors: Secondary | ICD-10-CM | POA: Diagnosis not present

## 2013-03-05 DIAGNOSIS — Z7901 Long term (current) use of anticoagulants: Secondary | ICD-10-CM | POA: Diagnosis not present

## 2013-03-05 LAB — POCT INR: INR: 2.5

## 2013-03-05 NOTE — Patient Instructions (Signed)
Patient instructed to take medications as defined in the Anti-coagulation Track section of this encounter.  Patient instructed to take today's dose.  Patient verbalized understanding of these instructions.    

## 2013-03-05 NOTE — Progress Notes (Signed)
Anti-Coagulation Progress Note  Judith [name] is a 77 y.o. female who is currently on an anti-coagulation regimen.    RECENT RESULTS: Recent results are below, the most recent result is correlated with a dose of 22.5 mg. per week: Lab Results  Component Value Date   INR 2.50 03/05/2013   INR 3.10 02/05/2013   INR 2.4 01/02/2013    ANTI-COAG DOSE: Anticoagulation Dose Instructions as of 03/05/2013     Judith [name] Tue Wed Thu Fri Sat   New Dose 2.5 mg 2.5 mg 2.5 mg 5 mg 2.5 mg 5 mg 2.5 mg       ANTICOAG SUMMARY: Anticoagulation Episode Summary   Current INR goal 2.0-3.0  Next INR check 04/09/2013  INR from last check 2.50 (03/05/2013)  Weekly max dose   Target end date Indefinite  [facility]  Preferred lab   Send INR reminders to [facility]   Indications  FACTOR V DEFICIENCY [286.3] Long term current use of anticoagulant [V58.61]        Comments Index VTE (DVT) was 2007. She was subsequently documented as having Leiden Factor V Deficiency. She has indicated a continued interest in remaining upon warfarin. Should re-visit this issue with patient annually. Currently "with the advice and consent of the patient--after review of risks vs. benefits--she elects to stay upon warfarin.      Anticoagulation Care Providers   Provider Role Specialty Phone number   Zoila Shutter, MD  Internal Medicine [phone]      ANTICOAG TODAY: Anticoagulation Summary as of 03/05/2013   INR goal 2.0-3.0  Selected INR 2.50 (03/05/2013)  Next INR check 04/09/2013  Target end date Indefinite   Indications  FACTOR V DEFICIENCY [286.3] Long term current use of anticoagulant [V58.61]      Anticoagulation Episode Summary   [facility] check location [facility]   Preferred lab    Send INR reminders to [facility]   Comments Index VTE (DVT) was 2007. She was subsequently documented as having Leiden Factor V Deficiency. She has indicated a continued interest in remaining upon  warfarin. Should re-visit this issue with patient annually. Currently "with the advice and consent of the patient--after review of risks vs. benefits--she elects to stay upon warfarin.    Anticoagulation Care Providers   Provider Role Specialty Phone number   Zoila Shutter, MD  Internal Medicine [phone]      PATIENT INSTRUCTIONS: Patient Instructions  Patient instructed to take medications as defined in the Anti-coagulation Track section of this encounter.  Patient instructed to take today's dose.  Patient verbalized understanding of these instructions.       FOLLOW-UP Return in 5 weeks (on 04/09/2013) for Follow up INR at 0900h.  Hulen Luster, III Pharm.D., CACP

## 2013-03-05 NOTE — Progress Notes (Signed)
Indication: Leiden factor V deficiency.  Duration: Lifelong per patient preference.  INR at target.  Agree with Dr. Saralyn Pilar assessment and plan as documented.

## 2013-03-07 DIAGNOSIS — R259 Unspecified abnormal involuntary movements: Secondary | ICD-10-CM | POA: Diagnosis not present

## 2013-03-07 DIAGNOSIS — Z961 Presence of intraocular lens: Secondary | ICD-10-CM | POA: Diagnosis not present

## 2013-03-07 DIAGNOSIS — H5210 Myopia, unspecified eye: Secondary | ICD-10-CM | POA: Diagnosis not present

## 2013-03-07 DIAGNOSIS — H04129 Dry eye syndrome of unspecified lacrimal gland: Secondary | ICD-10-CM | POA: Diagnosis not present

## 2013-04-03 ENCOUNTER — Encounter: Payer: Medicare Other | Admitting: Internal Medicine

## 2013-04-09 ENCOUNTER — Ambulatory Visit (INDEPENDENT_AMBULATORY_CARE_PROVIDER_SITE_OTHER): Payer: Medicare Other | Admitting: Pharmacist

## 2013-04-09 ENCOUNTER — Ambulatory Visit (INDEPENDENT_AMBULATORY_CARE_PROVIDER_SITE_OTHER): Payer: Medicare Other | Admitting: Internal Medicine

## 2013-04-09 ENCOUNTER — Encounter: Payer: Self-pay | Admitting: Internal Medicine

## 2013-04-09 VITALS — BP 133/71 | HR 67 | Temp 96.4°F | Ht 64.25 in | Wt 223.2 lb

## 2013-04-09 DIAGNOSIS — E785 Hyperlipidemia, unspecified: Secondary | ICD-10-CM

## 2013-04-09 DIAGNOSIS — R413 Other amnesia: Secondary | ICD-10-CM

## 2013-04-09 DIAGNOSIS — J309 Allergic rhinitis, unspecified: Secondary | ICD-10-CM

## 2013-04-09 DIAGNOSIS — Z7901 Long term (current) use of anticoagulants: Secondary | ICD-10-CM

## 2013-04-09 DIAGNOSIS — D682 Hereditary deficiency of other clotting factors: Secondary | ICD-10-CM | POA: Diagnosis not present

## 2013-04-09 DIAGNOSIS — I10 Essential (primary) hypertension: Secondary | ICD-10-CM | POA: Diagnosis not present

## 2013-04-09 DIAGNOSIS — R209 Unspecified disturbances of skin sensation: Secondary | ICD-10-CM

## 2013-04-09 DIAGNOSIS — J302 Other seasonal allergic rhinitis: Secondary | ICD-10-CM

## 2013-04-09 DIAGNOSIS — M810 Age-related osteoporosis without current pathological fracture: Secondary | ICD-10-CM

## 2013-04-09 DIAGNOSIS — R200 Anesthesia of skin: Secondary | ICD-10-CM

## 2013-04-09 LAB — POCT INR: INR: 2.7

## 2013-04-09 LAB — TSH: TSH: 1.228 u[IU]/mL (ref 0.350–4.500)

## 2013-04-09 LAB — VITAMIN B12: Vitamin B-12: 504 pg/mL (ref 211–911)

## 2013-04-09 NOTE — Patient Instructions (Signed)
Patient instructed to take medications as defined in the Anti-coagulation Track section of this encounter.  Patient instructed to take today's dose.  Patient verbalized understanding of these instructions.    

## 2013-04-09 NOTE — Patient Instructions (Addendum)
Judith [name],   We have done some blood work and we will call you if results are abnormal.  Your blood pressure is doing very well. Please keep taking your medicines as you are.  Thank you Let us mee tin 3 months.

## 2013-04-09 NOTE — Progress Notes (Signed)
Anti-Coagulation Progress Note  Judith [name] is a 77 y.o. female who is currently on an anti-coagulation regimen.    RECENT RESULTS: Recent results are below, the most recent result is correlated with a dose of 22.5 mg. per week: Lab Results  Component Value Date   INR 2.70 04/09/2013   INR 2.50 03/05/2013   INR 3.10 02/05/2013    ANTI-COAG DOSE: Anticoagulation Dose Instructions as of 04/09/2013     [name] Tue Wed Thu Fri Sat   New Dose 2.5 mg 2.5 mg 2.5 mg 5 mg 2.5 mg 5 mg 2.5 mg       ANTICOAG SUMMARY: Anticoagulation Episode Summary   Current INR goal 2.0-3.0  Next INR check 05/07/2013  INR from last check 2.70 (04/09/2013)  Weekly max dose   Target end date Indefinite  [facility] check [facility]  Preferred lab   Send INR reminders to [facility]   Indications  FACTOR V DEFICIENCY [286.3] Long term current use of anticoagulant [V58.61]        Comments Index VTE (DVT) was 2007. She was subsequently documented as having Leiden Factor V Deficiency. She has indicated a continued interest in remaining upon warfarin. Should re-visit this issue with patient annually. Currently "with the advice and consent of the patient--after review of risks vs. benefits--she elects to stay upon warfarin.      Anticoagulation Care Providers   Provider Role Specialty Phone number   Zoila Shutter, MD  Internal Medicine [phone]      ANTICOAG TODAY: Anticoagulation Summary as of 04/09/2013   INR goal 2.0-3.0  Selected INR 2.70 (04/09/2013)  Next INR check 05/07/2013  Target end date Indefinite   Indications  FACTOR V DEFICIENCY [286.3] Long term current use of anticoagulant [V58.61]      Anticoagulation Episode Summary   [facility] check location [facility]   Preferred lab    Send INR reminders to [facility]   Comments Index VTE (DVT) was 2007. She was subsequently documented as having Leiden Factor V Deficiency. She has indicated a continued interest in remaining upon  warfarin. Should re-visit this issue with patient annually. Currently "with the advice and consent of the patient--after review of risks vs. benefits--she elects to stay upon warfarin.    Anticoagulation Care Providers   Provider Role Specialty Phone number   Zoila Shutter, MD  Internal Medicine [phone]      PATIENT INSTRUCTIONS: Patient Instructions  Patient instructed to take medications as defined in the Anti-coagulation Track section of this encounter.  Patient instructed to take today's dose.  Patient verbalized understanding of these instructions.       FOLLOW-UP Return in 4 weeks (on 05/07/2013) for Follow up INR at 0915h.  Hulen Luster, III Pharm.D., CACP

## 2013-04-09 NOTE — Progress Notes (Addendum)
Subjective:     Patient ID: Judith [name], female   DOB: 12/02/1935, 77 y.o.   MRN: [id number]  HPI  Ms Minogue is a very pleasant 77 year old lady with hypertension (on Amlodipine 10 mg and Lisinopril 20 mg), hyperlipidemia (pravastatin 20 mg) and Factor V Leiden deficiency with history of DVT on warfarin. She comes for regular follow up, however brings up a lot of complaints this visit that she says she had been forgetting to mention past visits.   Seasonal Allergy problems - since the beginning of spring season and has had them for years. She has ringing in her ears every time this year. She also has runny nose, and dry eyes with no redness, post nasal drip. She has no cough. She takes an OTC allergy pill which is helping her she says.   Clumsiness, cannot walk staright and tingling in right hand -  She has tingling in right hand - 5-6 months. Does not have pain, no issue with left hand. No numbness or tingling in legs. No pain in legs. It comes when you lies on her right side, and when she moves her hands, it goes away. Takes centrum silver everyday. Vitamin D3 1000 units every day. She has never had thyroid issues, but says she feels chilly all the time.   Progressive forgetfulness - She says she has always been a forgetful person, however, she has been doing worse recently since the past few months. She has family history of dementia in her mother.   Leg swelling - bilateral, chronic, likely due to stasis, and currently stable. She wears compression stockings and is benfited.   Stress incontinence - The patient has this since she endocervical cancer, with cervicectomy in 2006 and is status post radiation. Does not want to do anything about it, but just wanted me to know. Has it when she sneezes, coughs, bends over. She uses pads for it. She has leakage even when she empties her bladder completely.   Medication changes that the patient did since the past visit - Stopped taking HCTZ and vitamin C  without being aware of it. She has stopped taking fosamax because her gynecologist stopped it.   Has right knee pain-  She takes some pain medicine OTC when she has knee. She wants to think about physiotherapy option.  Social situation - She lives with her husband currently. She can drive, has friends, and is more or less satisfied with her life, she says.    Review of Systems  Constitutional: Negative.   HENT: Positive for rhinorrhea, sneezing, postnasal drip, sinus pressure and tinnitus. Negative for hearing loss, ear pain, nosebleeds, congestion, sore throat, facial swelling, drooling, mouth sores, trouble swallowing, neck pain, neck stiffness, dental problem, voice change and ear discharge.   Eyes: Negative for photophobia, pain, discharge, redness, itching and visual disturbance.  Respiratory: Negative for apnea, cough, choking, chest tightness, shortness of breath, wheezing and stridor.   Cardiovascular: Positive for leg swelling (chronic). Negative for chest pain and palpitations.  Gastrointestinal: Negative.   Endocrine: Negative for cold intolerance and heat intolerance.  Genitourinary: Negative.   Musculoskeletal: Positive for arthralgias and gait problem.  Allergic/Immunologic: Positive for environmental allergies.  Hematological: Negative.   Psychiatric/Behavioral: Negative.       Objective:   Physical Exam  Nursing note and vitals reviewed.  Filed Vitals:   04/09/13 0923  BP: 133/71  Pulse: 67  Temp: 96.4 F (35.8 C)   General: Comfortably sitting in chair  HEENT:  PERRL, EOMI, no scleral icterus. No neck tenderness.  Heart: RRR, no rubs, murmurs or gallops.  Lungs: Clear to auscultation bilaterally, no wheezes, rales, or rhonchi.  Right upper extremity: Warm, right hand has decreased sensation over fingers per patient which improves proximally. No tenderness, no erythema, no wounds. Normal stregnth. Normal DTRs  Left Upper Extremity: No abnormality detected. Lower  Extremities: Strength intact. Palpable pulses. Wearing compression stockings. Neuro: Alert and oriented X3, cranial nerves II-XII grossly intact. Gait: No abnormality detecte.  Cerebellar: Finger-nose intact. Romberg's negative.     Assessment & Plan:     Clumsiness, paresthesias, gait trouble - Given this patient's MCV is 97, I strongly suspect Vitamin B12 deficiency might have a role to play in her symptoms. We will check TSH, Vitamin B12 Folic Acid, Vitamin D levels.  Forgetfulness - We did mini-cog today. Perfect recall and perfect clock drawing. Rule out dementia. We will repeat this test, as needed every 6 months to a year.  Hypertension - Well controlled, even though she stopped HCTZ on her own. Recommend to stay on the current medications.  BP Readings from Last 3 Encounters:  04/09/13 133/71  01/16/13 140/76  01/02/13 144/74   Seasonal Allergies - Continue taking OTC allergy medication which is benefiting  Return in 3 months.

## 2013-04-10 DIAGNOSIS — M79609 Pain in unspecified limb: Secondary | ICD-10-CM | POA: Diagnosis not present

## 2013-04-10 LAB — FOLATE RBC: RBC Folate: 1205 ng/mL (ref >=366)

## 2013-04-10 LAB — VITAMIN D 25 HYDROXY (VIT D DEFICIENCY, FRACTURES): Vit D, 25-Hydroxy: 49 ng/mL (ref 30–89)

## 2013-04-12 DIAGNOSIS — I824Z9 Acute embolism and thrombosis of unspecified deep veins of unspecified distal lower extremity: Secondary | ICD-10-CM | POA: Diagnosis not present

## 2013-04-21 ENCOUNTER — Other Ambulatory Visit: Payer: Self-pay | Admitting: Internal Medicine

## 2013-05-02 ENCOUNTER — Other Ambulatory Visit: Payer: Self-pay | Admitting: Internal Medicine

## 2013-05-03 NOTE — Telephone Encounter (Signed)
This pt was just seen in clinic on 6/2 and asked to return in 3 months. Do you want to see her sooner?

## 2013-05-03 NOTE — Telephone Encounter (Signed)
Overdue for clinic visit. Must schedule one soon.

## 2013-05-07 ENCOUNTER — Ambulatory Visit (INDEPENDENT_AMBULATORY_CARE_PROVIDER_SITE_OTHER): Payer: Medicare Other | Admitting: Pharmacist

## 2013-05-07 DIAGNOSIS — D682 Hereditary deficiency of other clotting factors: Secondary | ICD-10-CM | POA: Diagnosis not present

## 2013-05-07 DIAGNOSIS — Z7901 Long term (current) use of anticoagulants: Secondary | ICD-10-CM | POA: Diagnosis not present

## 2013-05-07 LAB — POCT INR: INR: 2.2

## 2013-05-07 NOTE — Patient Instructions (Signed)
Patient instructed to take medications as defined in the Anti-coagulation Track section of this encounter.  Patient instructed to take today's dose.  Patient verbalized understanding of these instructions.    

## 2013-05-07 NOTE — Progress Notes (Signed)
Anti-Coagulation Progress Note  Judith [name] is a 77 y.o. female who is currently on an anti-coagulation regimen.    RECENT RESULTS: Recent results are below, the most recent result is correlated with a dose of 22.5 mg. per week: Lab Results  Component Value Date   INR 2.20 05/07/2013   INR 2.70 04/09/2013   INR 2.50 03/05/2013    ANTI-COAG DOSE: Anticoagulation Dose Instructions as of 05/07/2013     [name] Tue Wed Thu Fri Sat   New Dose 2.5 mg 5 mg 2.5 mg 5 mg 2.5 mg 5 mg 2.5 mg       ANTICOAG SUMMARY: Anticoagulation Episode Summary   Current INR goal 2.0-3.0  Next INR check 06/04/2013  INR from last check 2.20 (05/07/2013)  Weekly max dose   Target end date Indefinite  [facility] check location [facility]  Preferred lab   Send INR reminders to [facility]   Indications  FACTOR V DEFICIENCY [286.3] Long term current use of anticoagulant [V58.61]        Comments Index VTE (DVT) was 2007. She was subsequently documented as having Leiden Factor V Deficiency. She has indicated a continued interest in remaining upon warfarin. Should re-visit this issue with patient annually. Currently "with the advice and consent of the patient--after review of risks vs. benefits--she elects to stay upon warfarin.      Anticoagulation Care Providers   Provider Role Specialty Phone number   Zoila Shutter, MD  Internal Medicine [phone]      ANTICOAG TODAY: Anticoagulation Summary as of 05/07/2013   INR goal 2.0-3.0  Selected INR 2.20 (05/07/2013)  Next INR check 06/04/2013  Target end date Indefinite   Indications  FACTOR V DEFICIENCY [286.3] Long term current use of anticoagulant [V58.61]      Anticoagulation Episode Summary   [facility] check location [facility]   Preferred lab    Send INR reminders to [facility]   Comments Index VTE (DVT) was 2007. She was subsequently documented as having Leiden Factor V Deficiency. She has indicated a continued interest in remaining upon  warfarin. Should re-visit this issue with patient annually. Currently "with the advice and consent of the patient--after review of risks vs. benefits--she elects to stay upon warfarin.    Anticoagulation Care Providers   Provider Role Specialty Phone number   Zoila Shutter, MD  Internal Medicine [phone]      PATIENT INSTRUCTIONS: Patient Instructions  Patient instructed to take medications as defined in the Anti-coagulation Track section of this encounter.  Patient instructed to take today's dose.  Patient verbalized understanding of these instructions.       FOLLOW-UP Return in 4 weeks (on 06/04/2013) for Follow up INR at 0915h.  Hulen Luster, III Pharm.D., CACP

## 2013-05-15 ENCOUNTER — Other Ambulatory Visit: Payer: Self-pay | Admitting: Internal Medicine

## 2013-06-04 ENCOUNTER — Other Ambulatory Visit: Payer: Self-pay | Admitting: Internal Medicine

## 2013-06-04 ENCOUNTER — Ambulatory Visit (INDEPENDENT_AMBULATORY_CARE_PROVIDER_SITE_OTHER): Payer: Medicare Other | Admitting: Pharmacist

## 2013-06-04 DIAGNOSIS — Z7901 Long term (current) use of anticoagulants: Secondary | ICD-10-CM | POA: Diagnosis not present

## 2013-06-04 DIAGNOSIS — D682 Hereditary deficiency of other clotting factors: Secondary | ICD-10-CM

## 2013-06-04 LAB — POCT INR: INR: 3.4

## 2013-06-04 NOTE — Patient Instructions (Signed)
Patient instructed to take medications as defined in the Anti-coagulation Track section of this encounter.  Patient instructed to take today's dose.  Patient verbalized understanding of these instructions.    

## 2013-06-04 NOTE — Progress Notes (Signed)
Anti-Coagulation Progress Note  Judith [name] is a 77 y.o. female who is currently on an anti-coagulation regimen.    RECENT RESULTS: Recent results are below, the most recent result is correlated with a dose of 25 mg. per week: Lab Results  Component Value Date   INR 3.40 06/04/2013   INR 2.20 05/07/2013   INR 2.70 04/09/2013    ANTI-COAG DOSE: Anticoagulation Dose Instructions as of 06/04/2013     [name] Tue Wed Thu Fri Sat   New Dose 2.5 mg 2.5 mg 2.5 mg 5 mg 2.5 mg 5 mg 2.5 mg       ANTICOAG SUMMARY: Anticoagulation Episode Summary   Current INR goal 2.0-3.0  Next INR check 06/25/2013  INR from last check 3.40! (06/04/2013)  Weekly max dose   Target end date Indefinite  [facility] check location [facility]  Preferred lab   Send INR reminders to [facility]   Indications  FACTOR V DEFICIENCY [286.3] Long term current use of anticoagulant [V58.61]        Comments Index VTE (DVT) was 2007. She was subsequently documented as having Leiden Factor V Deficiency. She has indicated a continued interest in remaining upon warfarin. Should re-visit this issue with patient annually. Currently "with the advice and consent of the patient--after review of risks vs. benefits--she elects to stay upon warfarin.      Anticoagulation Care Providers   Provider Role Specialty Phone number   Zoila Shutter, MD  Internal Medicine [phone]      ANTICOAG TODAY: Anticoagulation Summary as of 06/04/2013   INR goal 2.0-3.0  Selected INR 3.40! (06/04/2013)  Next INR check 06/25/2013  Target end date Indefinite   Indications  FACTOR V DEFICIENCY [286.3] Long term current use of anticoagulant [V58.61]      Anticoagulation Episode Summary   [facility] check location [facility]   Preferred lab    Send INR reminders to [facility]   Comments Index VTE (DVT) was 2007. She was subsequently documented as having Leiden Factor V Deficiency. She has indicated a continued interest in remaining upon  warfarin. Should re-visit this issue with patient annually. Currently "with the advice and consent of the patient--after review of risks vs. benefits--she elects to stay upon warfarin.    Anticoagulation Care Providers   Provider Role Specialty Phone number   Zoila Shutter, MD  Internal Medicine [phone]      PATIENT INSTRUCTIONS: Patient Instructions  Patient instructed to take medications as defined in the Anti-coagulation Track section of this encounter.  Patient instructed to take today's dose.  Patient verbalized understanding of these instructions.       FOLLOW-UP Return in 3 weeks (on 06/25/2013) for Follow up INR at 0900h.  Hulen Luster, III Pharm.D., CACP

## 2013-06-11 ENCOUNTER — Other Ambulatory Visit: Payer: Self-pay | Admitting: Internal Medicine

## 2013-06-11 NOTE — Telephone Encounter (Signed)
THis was just refilled on 06/04/13.  Can you confirm if pharmacy received?

## 2013-06-25 ENCOUNTER — Ambulatory Visit (INDEPENDENT_AMBULATORY_CARE_PROVIDER_SITE_OTHER): Payer: Medicare Other | Admitting: Pharmacist

## 2013-06-25 DIAGNOSIS — D682 Hereditary deficiency of other clotting factors: Secondary | ICD-10-CM | POA: Diagnosis not present

## 2013-06-25 DIAGNOSIS — Z7901 Long term (current) use of anticoagulants: Secondary | ICD-10-CM

## 2013-06-25 LAB — POCT INR: INR: 2.6

## 2013-06-25 NOTE — Patient Instructions (Signed)
Patient instructed to take medications as defined in the Anti-coagulation Track section of this encounter.  Patient instructed to take today's dose.  Patient verbalized understanding of these instructions.    

## 2013-06-25 NOTE — Progress Notes (Signed)
Anti-Coagulation Progress Note  Judith [name] is a 77 y.o. female who is currently on an anti-coagulation regimen.    RECENT RESULTS: Recent results are below, the most recent result is correlated with a dose of 22.5 mg. per week: Lab Results  Component Value Date   INR 2.60 06/25/2013   INR 3.40 06/04/2013   INR 2.20 05/07/2013    ANTI-COAG DOSE: Anticoagulation Dose Instructions as of 06/25/2013     [name] Tue Wed Thu Fri Sat   New Dose 2.5 mg 2.5 mg 2.5 mg 5 mg 2.5 mg 5 mg 2.5 mg       ANTICOAG SUMMARY: Anticoagulation Episode Summary   Current INR goal 2.0-3.0  Next INR check 07/23/2013  INR from last check 2.60 (06/25/2013)  Weekly max dose   Target end date Indefinite  [facility] check location [facility]  Preferred lab   Send INR reminders to [facility]   Indications  FACTOR V DEFICIENCY [286.3] Long term current use of anticoagulant [V58.61]        Comments Index VTE (DVT) was 2007. She was subsequently documented as having Leiden Factor V Deficiency. She has indicated a continued interest in remaining upon warfarin. Should re-visit this issue with patient annually. Currently "with the advice and consent of the patient--after review of risks vs. benefits--she elects to stay upon warfarin.      Anticoagulation Care Providers   Provider Role Specialty Phone number   Zoila Shutter, MD  Internal Medicine [phone]      ANTICOAG TODAY: Anticoagulation Summary as of 06/25/2013   INR goal 2.0-3.0  Selected INR 2.60 (06/25/2013)  Next INR check 07/23/2013  Target end date Indefinite   Indications  FACTOR V DEFICIENCY [286.3] Long term current use of anticoagulant [V58.61]      Anticoagulation Episode Summary   [facility]R check location [facility]   Preferred lab    Send INR reminders to [facility]   Comments Index VTE (DVT) was 2007. She was subsequently documented as having Leiden Factor V Deficiency. She has indicated a continued interest in remaining upon  warfarin. Should re-visit this issue with patient annually. Currently "with the advice and consent of the patient--after review of risks vs. benefits--she elects to stay upon warfarin.    Anticoagulation Care Providers   Provider Role Specialty Phone number   Zoila Shutter, MD  Internal Medicine [phone]      PATIENT INSTRUCTIONS: Patient Instructions  Patient instructed to take medications as defined in the Anti-coagulation Track section of this encounter.  Patient instructed to take today's dose.  Patient verbalized understanding of these instructions.       FOLLOW-UP Return in 4 weeks (on 07/23/2013) for Follow up INR at 0915h.  Hulen Luster, III Pharm.D., CACP

## 2013-06-26 NOTE — Progress Notes (Signed)
Indication: Heterozygous for Factor V Leiden with history of venous thrombosis, wishes to remain on anticoagulation. Duration: Lifelong. INR at target. Agree with Dr. Saralyn Pilar assessment and plan as documented.

## 2013-07-23 ENCOUNTER — Ambulatory Visit (INDEPENDENT_AMBULATORY_CARE_PROVIDER_SITE_OTHER): Payer: Medicare Other | Admitting: Pharmacist

## 2013-07-23 DIAGNOSIS — D682 Hereditary deficiency of other clotting factors: Secondary | ICD-10-CM | POA: Diagnosis not present

## 2013-07-23 DIAGNOSIS — Z7901 Long term (current) use of anticoagulants: Secondary | ICD-10-CM

## 2013-07-23 LAB — POCT INR: INR: 3.3

## 2013-07-23 NOTE — Progress Notes (Signed)
Anti-Coagulation Progress Note  Judith [name] is a 77 y.o. female who is currently on an anti-coagulation regimen.    RECENT RESULTS: Recent results are below, the most recent result is correlated with a dose of 22.5 mg. per week: Lab Results  Component Value Date   INR 3.30 07/23/2013   INR 2.60 06/25/2013   INR 3.40 06/04/2013    ANTI-COAG DOSE: Anticoagulation Dose Instructions as of 07/23/2013     [name] Tue Wed Thu Fri Sat   New Dose 2.5 mg 2.5 mg 2.5 mg 5 mg 2.5 mg 2.5 mg 2.5 mg       ANTICOAG SUMMARY: Anticoagulation Episode Summary   Current INR goal 2.0-3.0  Next INR check 08/20/2013  INR from last check 3.30! (07/23/2013)  Weekly max dose   Target end date Indefinite  [facility]R check location [facility]  Preferred lab   Send INR reminders to [facility]AG [facility]   Indications  FACTOR V DEFICIENCY [286.3] Long term current use of anticoagulant [V58.61]        Comments Index VTE (DVT) was 2007. She was subsequently documented as having Leiden Factor V Deficiency. She has indicated a continued interest in remaining upon warfarin. Should re-visit this issue with patient annually. Currently "with the advice and consent of the patient--after review of risks vs. benefits--she elects to stay upon warfarin.      Anticoagulation Care Providers   Provider Role Specialty Phone number   Zoila Shutter, MD  Internal Medicine [phone]      ANTICOAG TODAY: Anticoagulation Summary as of 07/23/2013   INR goal 2.0-3.0  Selected INR 3.30! (07/23/2013)  Next INR check 08/20/2013  Target end date Indefinite   Indications  FACTOR V DEFICIENCY [286.3] Long term current use of anticoagulant [V58.61]      Anticoagulation Episode Summary   [facility] check location [facility]   Preferred lab    Send INR reminders to [facility]AG [facility]   Comments Index VTE (DVT) was 2007. She was subsequently documented as having Leiden Factor V Deficiency. She has indicated a continued interest in remaining  upon warfarin. Should re-visit this issue with patient annually. Currently "with the advice and consent of the patient--after review of risks vs. benefits--she elects to stay upon warfarin.    Anticoagulation Care Providers   Provider Role Specialty Phone number   Zoila Shutter, MD  Internal Medicine [phone]      PATIENT INSTRUCTIONS: Patient Instructions  Patient instructed to take medications as defined in the Anti-coagulation Track section of this encounter.  Patient instructed to take today's dose.  Patient verbalized understanding of these instructions.       FOLLOW-UP Return in 4 weeks (on 08/20/2013) for Follow up INR at 0915h.  Hulen Luster, III Pharm.D., CACP

## 2013-07-23 NOTE — Patient Instructions (Signed)
Patient instructed to take medications as defined in the Anti-coagulation Track section of this encounter.  Patient instructed to take today's dose.  Patient verbalized understanding of these instructions.    

## 2013-07-27 ENCOUNTER — Other Ambulatory Visit: Payer: Self-pay | Admitting: Internal Medicine

## 2013-07-29 ENCOUNTER — Other Ambulatory Visit: Payer: Self-pay | Admitting: Internal Medicine

## 2013-07-30 DIAGNOSIS — Z23 Encounter for immunization: Secondary | ICD-10-CM | POA: Diagnosis not present

## 2013-08-07 ENCOUNTER — Other Ambulatory Visit: Payer: Self-pay | Admitting: Internal Medicine

## 2013-08-15 ENCOUNTER — Telehealth: Payer: Self-pay | Admitting: *Deleted

## 2013-08-15 NOTE — Telephone Encounter (Signed)
Agree with advice given to patient.

## 2013-08-15 NOTE — Telephone Encounter (Signed)
Pt called with c/o cold and cough for 1 1/2 weeks.   She is taking cough meds at night and Sinus meds to help with congestion in her head.  Denies fever, SOB, headache or dizziness She wants to know if she needs to be seen.  Pt advised to call back in a few days if not improved, please call is she develops fever, SOB or other changes.

## 2013-08-20 ENCOUNTER — Ambulatory Visit (INDEPENDENT_AMBULATORY_CARE_PROVIDER_SITE_OTHER): Payer: Medicare Other | Admitting: Pharmacist

## 2013-08-20 DIAGNOSIS — Z7901 Long term (current) use of anticoagulants: Secondary | ICD-10-CM

## 2013-08-20 DIAGNOSIS — D682 Hereditary deficiency of other clotting factors: Secondary | ICD-10-CM | POA: Diagnosis not present

## 2013-08-20 LAB — POCT INR: INR: 3.7

## 2013-08-20 NOTE — Patient Instructions (Signed)
Patient instructed to take medications as defined in the Anti-coagulation Track section of this encounter.  Patient instructed to OMIT/HOLD today's dose.  Patient verbalized understanding of these instructions.    

## 2013-08-20 NOTE — Progress Notes (Signed)
Anti-Coagulation Progress Note  Judith [name] is a 77 y.o. female who is currently on an anti-coagulation regimen.    RECENT RESULTS: Recent results are below, the most recent result is correlated with a dose of 20 mg. per week: Lab Results  Component Value Date   INR 3.70 08/20/2013   INR 3.30 07/23/2013   INR 2.60 06/25/2013    ANTI-COAG DOSE: Anticoagulation Dose Instructions as of 08/20/2013     [name] Tue Wed Thu Fri Sat   New Dose 2.5 mg 2.5 mg 2.5 mg 5 mg 2.5 mg 2.5 mg 2.5 mg       ANTICOAG SUMMARY: Anticoagulation Episode Summary   Current INR goal 2.0-3.0  Next INR check 09/03/2013  INR from last check 3.70! (08/20/2013)  Weekly max dose   Target end date Indefinite  [facility] check [facility]  Preferred lab   Send INR reminders to [facility]   Indications  FACTOR V DEFICIENCY [286.3] Long term current use of anticoagulant [V58.61]        Comments Index VTE (DVT) was 2007. She was subsequently documented as having Leiden Factor V Deficiency. She has indicated a continued interest in remaining upon warfarin. Should re-visit this issue with patient annually. Currently "with the advice and consent of the patient--after review of risks vs. benefits--she elects to stay upon warfarin.      Anticoagulation Care Providers   Provider Role Specialty Phone number   Zoila Shutter, MD  Internal Medicine [phone]      ANTICOAG TODAY: Anticoagulation Summary as of 08/20/2013   INR goal 2.0-3.0  Selected INR 3.70! (08/20/2013)  Next INR check 09/03/2013  Target end date Indefinite   Indications  FACTOR V DEFICIENCY [286.3] Long term current use of anticoagulant [V58.61]      Anticoagulation Episode Summary   [facility] check [facility]   Preferred lab    Send INR reminders to [facility]   Comments Index VTE (DVT) was 2007. She was subsequently documented as having Leiden Factor V Deficiency. She has indicated a continued interest in  remaining upon warfarin. Should re-visit this issue with patient annually. Currently "with the advice and consent of the patient--after review of risks vs. benefits--she elects to stay upon warfarin.    Anticoagulation Care Providers   Provider Role Specialty Phone number   Zoila Shutter, MD  Internal Medicine [phone]      PATIENT INSTRUCTIONS: Patient Instructions  Patient instructed to take medications as defined in the Anti-coagulation Track section of this encounter.  Patient instructed to OMIT/HOLD today's dose.  Patient verbalized understanding of these instructions.       FOLLOW-UP Return in 2 weeks (on 09/03/2013).  Hulen Luster, III Pharm.D., CACP

## 2013-09-03 ENCOUNTER — Ambulatory Visit (INDEPENDENT_AMBULATORY_CARE_PROVIDER_SITE_OTHER): Payer: Medicare Other | Admitting: Pharmacist

## 2013-09-03 DIAGNOSIS — Z7901 Long term (current) use of anticoagulants: Secondary | ICD-10-CM

## 2013-09-03 DIAGNOSIS — D682 Hereditary deficiency of other clotting factors: Secondary | ICD-10-CM

## 2013-09-03 LAB — POCT INR: INR: 3.1

## 2013-09-03 NOTE — Progress Notes (Signed)
Indication: Heterozygous for Factor V Leiden with history of venous thrombosis, wishes to remain on anticoagulation. Duration: Lifelong. INR Above target. Agree with Dr. Saralyn Pilar assessment and plan as documented.

## 2013-09-03 NOTE — Patient Instructions (Signed)
Patient instructed to take medications as defined in the Anti-coagulation Track section of this encounter.  Patient instructed to take today's dose.  Patient verbalized understanding of these instructions.    

## 2013-09-03 NOTE — Progress Notes (Signed)
Anti-Coagulation Progress Note  Judith [name] is a 77 y.o. female who is currently on an anti-coagulation regimen.    RECENT RESULTS: Recent results are below, the most recent result is correlated with a dose of 20 mg. per week: Lab Results  Component Value Date   INR 3.10 09/03/2013   INR 3.70 08/20/2013   INR 3.30 07/23/2013    ANTI-COAG DOSE: Anticoagulation Dose Instructions as of 09/03/2013     [name] Tue Wed Thu Fri Sat   New Dose 2.5 mg 2.5 mg 2.5 mg 0 mg 2.5 mg 2.5 mg 2.5 mg       ANTICOAG SUMMARY: Anticoagulation Episode Summary   Current INR goal 2.0-3.0  Next INR check 09/24/2013  INR from last check 3.10! (09/03/2013)  Weekly max dose   Target end date Indefinite  [facility]  Preferred lab   Send INR reminders to [facility]   Indications  FACTOR V DEFICIENCY [286.3] Long term current use of anticoagulant [V58.61]        Comments Index VTE (DVT) was 2007. She was subsequently documented as having Leiden Factor V Deficiency. She has indicated a continued interest in remaining upon warfarin. Should re-visit this issue with patient annually. Currently "with the advice and consent of the patient--after review of risks vs. benefits--she elects to stay upon warfarin.      Anticoagulation Care Providers   Provider Role Specialty Phone number   Zoila Shutter, MD  Internal Medicine [phone]      ANTICOAG TODAY: Anticoagulation Summary as of 09/03/2013   INR goal 2.0-3.0  Selected INR 3.10! (09/03/2013)  Next INR check 09/24/2013  Target end date Indefinite   Indications  FACTOR V DEFICIENCY [286.3] Long term current use of anticoagulant [V58.61]      Anticoagulation Episode Summary   [facility] check location [facility]   Preferred lab    Send INR reminders to [facility]   Comments Index VTE (DVT) was 2007. She was subsequently documented as having Leiden Factor V Deficiency. She has indicated a continued interest in  remaining upon warfarin. Should re-visit this issue with patient annually. Currently "with the advice and consent of the patient--after review of risks vs. benefits--she elects to stay upon warfarin.    Anticoagulation Care Providers   Provider Role Specialty Phone number   Zoila Shutter, MD  Internal Medicine [phone]      PATIENT INSTRUCTIONS: Patient Instructions  Patient instructed to take medications as defined in the Anti-coagulation Track section of this encounter.  Patient instructed to take today's dose.  Patient verbalized understanding of these instructions.       FOLLOW-UP Return in 3 weeks (on 09/24/2013) for Follow up INR at 0845h.  Hulen Luster, III Pharm.D., CACP

## 2013-09-24 ENCOUNTER — Ambulatory Visit (INDEPENDENT_AMBULATORY_CARE_PROVIDER_SITE_OTHER): Payer: Medicare Other | Admitting: Pharmacist

## 2013-09-24 DIAGNOSIS — D682 Hereditary deficiency of other clotting factors: Secondary | ICD-10-CM

## 2013-09-24 DIAGNOSIS — Z7901 Long term (current) use of anticoagulants: Secondary | ICD-10-CM

## 2013-09-24 LAB — POCT INR: INR: 1.7

## 2013-09-24 NOTE — Progress Notes (Signed)
Anti-Coagulation Progress Note  Judith [name] is a 77 y.o. female who is currently on an anti-coagulation regimen.    RECENT RESULTS: Recent results are below, the most recent result is correlated with a dose of 15 mg. per week: Lab Results  Component Value Date   INR 1.70 09/24/2013   INR 3.10 09/03/2013   INR 3.70 08/20/2013    ANTI-COAG DOSE: Anticoagulation Dose Instructions as of 09/24/2013     [name] Tue Wed Thu Fri Sat   New Dose 2.5 mg 5 mg 2.5 mg 2.5 mg 2.5 mg 2.5 mg 2.5 mg       ANTICOAG SUMMARY: Anticoagulation Episode Summary   Current INR goal 2.0-3.0  Next INR check 10/08/2013  INR from last check 1.70! (09/24/2013)  Weekly max dose   Target end date Indefinite  [facility] check [facility]  Preferred lab   Send INR reminders to [facility]   Indications  FACTOR V DEFICIENCY [286.3] Long term current use of anticoagulant [V58.61]        Comments Index VTE (DVT) was 2007. She was subsequently documented as having Leiden Factor V Deficiency. She has indicated a continued interest in remaining upon warfarin. Should re-visit this issue with patient annually. Currently "with the advice and consent of the patient--after review of risks vs. benefits--she elects to stay upon warfarin.      Anticoagulation Care Providers   Provider Role Specialty Phone number   Zoila Shutter, MD  Internal Medicine [phone]      ANTICOAG TODAY: Anticoagulation Summary as of 09/24/2013   INR goal 2.0-3.0  Selected INR 1.70! (09/24/2013)  Next INR check 10/08/2013  Target end date Indefinite   Indications  FACTOR V DEFICIENCY [286.3] Long term current use of anticoagulant [V58.61]      Anticoagulation Episode Summary   [facility] check location [facility]   Preferred lab    Send INR reminders to [facility]   Comments Index VTE (DVT) was 2007. She was subsequently documented as having Leiden Factor V Deficiency. She has indicated a continued interest in  remaining upon warfarin. Should re-visit this issue with patient annually. Currently "with the advice and consent of the patient--after review of risks vs. benefits--she elects to stay upon warfarin.    Anticoagulation Care Providers   Provider Role Specialty Phone number   Zoila Shutter, MD  Internal Medicine [phone]      PATIENT INSTRUCTIONS: Patient Instructions  Patient instructed to take medications as defined in the Anti-coagulation Track section of this encounter.  Patient instructed to take today's dose.  Patient verbalized understanding of these instructions.       FOLLOW-UP Return in 2 weeks (on 10/08/2013) for Follow up INR at 0915h.  Hulen Luster, III Pharm.D., CACP

## 2013-09-24 NOTE — Patient Instructions (Signed)
Patient instructed to take medications as defined in the Anti-coagulation Track section of this encounter.  Patient instructed to take today's dose.  Patient verbalized understanding of these instructions.    

## 2013-10-08 ENCOUNTER — Ambulatory Visit (INDEPENDENT_AMBULATORY_CARE_PROVIDER_SITE_OTHER): Payer: Medicare Other | Admitting: Pharmacist

## 2013-10-08 DIAGNOSIS — Z7901 Long term (current) use of anticoagulants: Secondary | ICD-10-CM

## 2013-10-08 DIAGNOSIS — D682 Hereditary deficiency of other clotting factors: Secondary | ICD-10-CM

## 2013-10-08 LAB — POCT INR: INR: 2.2

## 2013-10-08 NOTE — Progress Notes (Signed)
agree

## 2013-10-08 NOTE — Progress Notes (Signed)
Anti-Coagulation Progress Note  Judith [name] is a 77 y.o. female who is currently on an anti-coagulation regimen.    RECENT RESULTS: Recent results are below, the most recent result is correlated with a dose of 20 mg. per week: Lab Results  Component Value Date   INR 2.20 10/08/2013   INR 1.70 09/24/2013   INR 3.10 09/03/2013    ANTI-COAG DOSE: Anticoagulation Dose Instructions as of 10/08/2013     [name] Tue Wed Thu Fri Sat   New Dose 2.5 mg 5 mg 2.5 mg 2.5 mg 5 mg 2.5 mg 2.5 mg       ANTICOAG SUMMARY: Anticoagulation Episode Summary   Current INR goal 2.0-3.0  Next INR check 11/05/2013  INR from last check 2.20 (10/08/2013)  Weekly max dose   Target end date Indefinite  [facility] check [facility]  Preferred lab   Send INR reminders to [facility]   Indications  FACTOR V DEFICIENCY [286.3] Long term current use of anticoagulant [V58.61]        Comments Index VTE (DVT) was 2007. She was subsequently documented as having Leiden Factor V Deficiency. She has indicated a continued interest in remaining upon warfarin. Should re-visit this issue with patient annually. Currently "with the advice and consent of the patient--after review of risks vs. benefits--she elects to stay upon warfarin.      Anticoagulation Care Providers   Provider Role Specialty Phone number   Zoila Shutter, MD  Internal Medicine [phone]      ANTICOAG TODAY: Anticoagulation Summary as of 10/08/2013   INR goal 2.0-3.0  Selected INR 2.20 (10/08/2013)  Next INR check 11/05/2013  Target end date Indefinite   Indications  FACTOR V DEFICIENCY [286.3] Long term current use of anticoagulant [V58.61]      Anticoagulation Episode Summary   [facility] check location [facility]   Preferred lab    Send INR reminders to [facility]   Comments Index VTE (DVT) was 2007. She was subsequently documented as having Leiden Factor V Deficiency. She has indicated a continued interest in remaining  upon warfarin. Should re-visit this issue with patient annually. Currently "with the advice and consent of the patient--after review of risks vs. benefits--she elects to stay upon warfarin.    Anticoagulation Care Providers   Provider Role Specialty Phone number   Zoila Shutter, MD  Internal Medicine [phone]      PATIENT INSTRUCTIONS: Patient Instructions  Patient instructed to take medications as defined in the Anti-coagulation Track section of this encounter.  Patient instructed to take today's dose.  Patient verbalized understanding of these instructions.       FOLLOW-UP Return in 4 weeks (on 11/05/2013) for Follow up INR at 0845h.  Hulen Luster, III Pharm.D., CACP

## 2013-10-08 NOTE — Patient Instructions (Signed)
Patient instructed to take medications as defined in the Anti-coagulation Track section of this encounter.  Patient instructed to take today's dose.  Patient verbalized understanding of these instructions.    

## 2013-10-17 ENCOUNTER — Other Ambulatory Visit: Payer: Self-pay | Admitting: Internal Medicine

## 2013-10-21 ENCOUNTER — Other Ambulatory Visit: Payer: Self-pay | Admitting: Internal Medicine

## 2013-10-23 ENCOUNTER — Encounter: Payer: Self-pay | Admitting: Internal Medicine

## 2013-10-23 ENCOUNTER — Other Ambulatory Visit: Payer: Self-pay | Admitting: Internal Medicine

## 2013-10-23 ENCOUNTER — Ambulatory Visit (INDEPENDENT_AMBULATORY_CARE_PROVIDER_SITE_OTHER): Payer: Medicare Other | Admitting: Internal Medicine

## 2013-10-23 VITALS — BP 124/59 | HR 71 | Temp 96.9°F | Ht 63.75 in | Wt 231.0 lb

## 2013-10-23 DIAGNOSIS — Z0000 Encounter for general adult medical examination without abnormal findings: Secondary | ICD-10-CM

## 2013-10-23 DIAGNOSIS — E875 Hyperkalemia: Secondary | ICD-10-CM

## 2013-10-23 DIAGNOSIS — I10 Essential (primary) hypertension: Secondary | ICD-10-CM | POA: Diagnosis not present

## 2013-10-23 DIAGNOSIS — Z7901 Long term (current) use of anticoagulants: Secondary | ICD-10-CM | POA: Diagnosis not present

## 2013-10-23 DIAGNOSIS — E785 Hyperlipidemia, unspecified: Secondary | ICD-10-CM

## 2013-10-23 DIAGNOSIS — R6889 Other general symptoms and signs: Secondary | ICD-10-CM

## 2013-10-23 DIAGNOSIS — D682 Hereditary deficiency of other clotting factors: Secondary | ICD-10-CM | POA: Diagnosis not present

## 2013-10-23 LAB — COMPLETE METABOLIC PANEL WITH GFR
ALT: 13 U/L (ref 0–35)
AST: 19 U/L (ref 0–37)
Albumin: 4.1 g/dL (ref 3.5–5.2)
Alkaline Phosphatase: 77 U/L (ref 39–117)
BUN: 23 mg/dL (ref 6–23)
CO2: 25 mEq/L (ref 19–32)
Calcium: 10.1 mg/dL (ref 8.4–10.5)
Chloride: 107 mEq/L (ref 96–112)
Creat: 1.05 mg/dL (ref 0.50–1.10)
GFR, Est African American: 60 mL/min
GFR, Est Non African American: 52 mL/min — ABNORMAL LOW
Glucose, Bld: 97 mg/dL (ref 70–99)
Potassium: 5.4 mEq/L — ABNORMAL HIGH (ref 3.5–5.3)
Sodium: 143 mEq/L (ref 135–145)
Total Bilirubin: 0.4 mg/dL (ref 0.3–1.2)
Total Protein: 7 g/dL (ref 6.0–8.3)

## 2013-10-23 LAB — LIPID PANEL
Cholesterol: 197 mg/dL (ref 0–200)
HDL: 62 mg/dL (ref >39)
LDL Cholesterol: 95 mg/dL (ref 0–99)
Total CHOL/HDL Ratio: 3.2 Ratio
Triglycerides: 200 mg/dL — ABNORMAL HIGH (ref <150)
VLDL: 40 mg/dL (ref 0–40)

## 2013-10-23 NOTE — Patient Instructions (Addendum)
Judith [name],   A pleasure to see you today.  Your blood pressure was high when you came in but we measured it again it was normal.  We will do some routine blood work today and I will call you if something is abnormal.  We did a memory test today and you did okay!!!  Let us meet in 1 month, if I am not available you see some other physician in the clinic - to follow up on your blood pressure and potassium.  Thanks Kimblery Diop       Dementia Dementia is a word that is used to describe problems with the brain and how it works. People with dementia have memory loss. They may also have problems with thinking, speaking, or solving problems. It can affect how they act around people, how they do their job, their mood, and their personality. These changes may not show up for a long time. Family or friends may not notice problems in the early part of this disease. HOME CARE The following tips are for the person living with, or caring for, the person with dementia. Make the home safe.  Remove locks on bathroom doors.  Use childproof locks on cabinets where alcohol, cleaning supplies, or chemicals are stored.  Put outlet covers in electrical outlets.  Put in childproof locks to keep doors and windows safe.  Remove stove knobs, or put in safety knobs that shut off on their own.  Lower the temperature on water heaters.  Label medicines. Lock them in a safe place.  Keep knives, lighters, matches, power tools, and guns out of reach or in a safe place.  Remove objects that might break or can hurt the person.  Make sure lighting is good inside and outside.  Put in grab bars if needed.  Use a device that detects falls or other needs for help. Lessen confusion.  Keep familiar objects and people around.  Use night lights or low lit (dim) lights at night.  Label objects or areas.  Use reminders, notes, or directions for daily activities or tasks.  Keep a simple routine that is the  same for waking, meals, bathing, dressing, and bedtime.  Create a calm and quiet home.  Put up clocks and calendars.  Keep emergency numbers and the home address near all phones.  Help show the different times of day. Open the curtains during the day to let light in. Speak clearly and directly.  Choose simple words and short sentences.  Use a gentle, calm voice.  Do not interrupt.  If the person has a hard time finding a word to use, give them the word or thought.  Ask 1 question at a time. Give enough time for the person to answer. Repeat the question if the person does not answer. Do things that lessen restlessness.  Provide a comfortable bed.  Have the same bedtime routine every night.  Have a regular walking and activity schedule.  Lessen naps during the day.  Do not let the person drink a lot of caffeine.  Go to events that are not overwhelming. Eat well and drink fluids.  Lessen distractions during meal times and snacks.  Avoid foods that are too hot or too cold.  Watch how the person chews and swallows. This is to make sure they do not choke. Other  Keep all vision, hearing, dental, and medical visits with the doctor.  Only give medicines as told by the doctor.  Watch the person's driving ability. Do  not let the person drive if he or she cannot drive safely.  Use a program that helps find a person if they become missing. You may need to register with this program. GET HELP RIGHT AWAY IF:   A fever of 102 F (38.9 C) develops.  Confusion develops or gets worse.  Sleepiness develops or gets worse.  Staying awake is hard to do.  New behavior problems start like mood swings, aggression, and seeing things that are not there.  Problems with balance, speech, or falling develop.  Problems swallowing develop.  Any problems of another sickness develop.   Hyperkalemia Hyperkalemia means you have too much potassium in your blood. Potassium is a type of  salt in the blood (electrolyte). Normally, your kidneys remove potassium from the body. Too much potassium can be life-threatening. HOME CARE  Only take medicine as told by your doctor.  Do not take vitamins or natural products unless your doctor says they are okay.  Keep all doctor visits as told.  Follow diet instructions as told by your doctor. GET HELP RIGHT AWAY IF:  Your heartbeat is not regular or very slow.  You feel dizzy (lightheaded).  You feel weak.  You are short of breath.  You have chest pain.  You pass out (faint).

## 2013-10-24 ENCOUNTER — Telehealth: Payer: Self-pay | Admitting: *Deleted

## 2013-10-24 DIAGNOSIS — F0280 Dementia in other diseases classified elsewhere without behavioral disturbance: Secondary | ICD-10-CM | POA: Insufficient documentation

## 2013-10-24 MED ORDER — ALENDRONATE SODIUM 70 MG PO TABS: 70.0000 mg | ORAL_TABLET | ORAL | Status: DC

## 2013-10-24 NOTE — Assessment & Plan Note (Signed)
Assessment: On pravastatin 20 mg Last LDL was 114.   Plan:  Lipid profile done this visit.  Would reassess statin dosage after results.   Lab results   Lab Results  Component Value Date   CHOL 197 10/23/2013   HDL 62 10/23/2013   LDLCALC 95 10/23/2013   LDLDIRECT 133* 07/07/2010   TRIG 200* 10/23/2013   CHOLHDL 3.2 10/23/2013   We will add an LDL direct in view of high triglycerides.

## 2013-10-24 NOTE — Addendum Note (Signed)
Addended by: Aletta Edouard on: 10/24/2013 10:15 AM   Modules accepted: Orders

## 2013-10-24 NOTE — Assessment & Plan Note (Signed)
   Flu shot - Patient has already taken outside of clinic per her report.  Mammogram - The patient would like to continue her mammograms, despite the discussion about risks and benefits at this age and false positive tests etc, she insists. Screening mammogram ordered.   Bone health - The patient's med chart says that she is on Fosamax, however the patient denies taking any medication of that name. A DEXA scan from 01/14/2012 documents osteoporosis. There is a note by Dr. Colin Broach (02/03/2012) which states that she should take it for 2 years and then a bone scan would be repeated and a drug holiday would be considered. I will restart which medication, and next visit, we will enquire about dental health of the patient.

## 2013-10-24 NOTE — Telephone Encounter (Signed)
Talked with pt per Dr Dalphine Handing about starting back on Fosamax - Rx has been sent and repeat potassium 10/25/13 due to inc on potassium level. Appt made in AM. Larnce Schnackenberg RN 10/24/13 1:30PM

## 2013-10-24 NOTE — Assessment & Plan Note (Signed)
Assessment: Family history of dementia. The patient exhibits some signs of short term recall. A mini-cog was done (submitted for scanning under "media" tab). The patient could draw the clock face well, however could recall only 1 word out of three.   Plan:  Normal cognitive function.   We will keep repeating mino-cog from time to time.  Information about dementia provided to the patient - printed material from the web and epic.

## 2013-10-24 NOTE — Assessment & Plan Note (Signed)
Assessment: Initial BP was high, the repeat BP was within normal range.  Plan:  Continue current regimen - lisinopril 20 and amlodipine 10.

## 2013-10-24 NOTE — Progress Notes (Addendum)
Subjective:     Patient ID: Judith [name], female   DOB: [birth year], 77 y.o.   MRN: [id number]  HPI Judith [name] comes in for regular follow up and has no new complaints. She reports that her allergies are better and she is taking [facility] allergy relief for them, which helps a lot. She has tingling fingers in right hand, which seem to be the same. It does not bother her much. There is no swelling, pain, erythema or skin changes associated with tingling. She endorses arthritis of the right knee, however she is still able to function without a walker/cane (she has them just in case). She is worried about forgetfulness because her mother had dementia. Her blood pressure is elevated this visit, however, she denies any headache, chest pain, visual changes, urinary changes, or any symptoms of dizziness or weakness.  Review of Systems As per HPI.    Objective:   Physical Exam General: No acute distress. The patient speaks as if she is having labored speech, however this is due to her cleft lip. She is a mouth breather as well.  HEENT: PERRL, EOMI.   CV: S1S2 RRR, no murmur Lungs: Bilateral Vesicular breath sounds.  Abdomen: Soft, non-tender, benign Pedal Edema: none Pedal pulses present.  Neuro: Alert and oriented times 3. No gross deficits seen.      Assessment & Plan:     Please see problem based charting.     ADDENDUM 10/24/13  K 5.4 I will repeat BMP tomorrow. Will ask the patient to come in.   ADDENDUM 10/25/13   Potassium 5.3 from 5.4. Some hemolysis accompanying.   I changed the patient's lisinopril 20mg  to lisinopril-HCTZ 10-12.5 I gave the patient a list of high and low potassium foods. She is asymptomatic.  I will call her back in 1 month to review her blood pressure and reassess BMP.

## 2013-10-25 ENCOUNTER — Other Ambulatory Visit (INDEPENDENT_AMBULATORY_CARE_PROVIDER_SITE_OTHER): Payer: Medicare Other

## 2013-10-25 DIAGNOSIS — E875 Hyperkalemia: Secondary | ICD-10-CM | POA: Diagnosis not present

## 2013-10-25 LAB — BASIC METABOLIC PANEL WITH GFR
BUN: 23 mg/dL (ref 6–23)
CO2: 24 mEq/L (ref 19–32)
Calcium: 9.6 mg/dL (ref 8.4–10.5)
Chloride: 103 mEq/L (ref 96–112)
Creat: 0.98 mg/dL (ref 0.50–1.10)
GFR, Est African American: 65 mL/min
GFR, Est Non African American: 57 mL/min — ABNORMAL LOW
Glucose, Bld: 86 mg/dL (ref 70–99)
Potassium: 5.3 mEq/L (ref 3.5–5.3)
Sodium: 137 mEq/L (ref 135–145)

## 2013-10-25 LAB — LDL CHOLESTEROL, DIRECT: Direct LDL: 119 mg/dL — ABNORMAL HIGH

## 2013-10-25 MED ORDER — LISINOPRIL-HYDROCHLOROTHIAZIDE 10-12.5 MG PO TABS: 1.0000 | ORAL_TABLET | Freq: Every day | ORAL | Status: DC

## 2013-10-25 NOTE — Progress Notes (Signed)
Labs were done this AM as requested.

## 2013-10-25 NOTE — Addendum Note (Signed)
Addended by: Aletta Edouard on: 10/25/2013 10:35 AM   Modules accepted: Orders, Medications

## 2013-10-25 NOTE — Progress Notes (Signed)
Potassium 5.3 from 5.4. Some hemolysis accompanying.   I will give the patient a list of low potassium foods. She is asymptomatic.  I will call

## 2013-11-05 ENCOUNTER — Ambulatory Visit: Payer: Medicare Other

## 2013-11-11 ENCOUNTER — Other Ambulatory Visit: Payer: Self-pay | Admitting: Internal Medicine

## 2013-11-11 DIAGNOSIS — E785 Hyperlipidemia, unspecified: Secondary | ICD-10-CM

## 2013-11-12 ENCOUNTER — Ambulatory Visit (INDEPENDENT_AMBULATORY_CARE_PROVIDER_SITE_OTHER): Payer: Medicare Other | Admitting: Internal Medicine

## 2013-11-12 ENCOUNTER — Ambulatory Visit (INDEPENDENT_AMBULATORY_CARE_PROVIDER_SITE_OTHER): Payer: Medicare Other | Admitting: Pharmacist

## 2013-11-12 ENCOUNTER — Encounter: Payer: Self-pay | Admitting: Internal Medicine

## 2013-11-12 VITALS — BP 131/70 | HR 98 | Temp 97.0°F | Wt 230.2 lb

## 2013-11-12 DIAGNOSIS — R6889 Other general symptoms and signs: Secondary | ICD-10-CM | POA: Diagnosis not present

## 2013-11-12 DIAGNOSIS — I10 Essential (primary) hypertension: Secondary | ICD-10-CM

## 2013-11-12 DIAGNOSIS — D682 Hereditary deficiency of other clotting factors: Secondary | ICD-10-CM | POA: Diagnosis not present

## 2013-11-12 DIAGNOSIS — Z7901 Long term (current) use of anticoagulants: Secondary | ICD-10-CM | POA: Diagnosis not present

## 2013-11-12 LAB — BASIC METABOLIC PANEL
BUN: 25 mg/dL — ABNORMAL HIGH (ref 6–23)
CO2: 25 mEq/L (ref 19–32)
Calcium: 9.4 mg/dL (ref 8.4–10.5)
Chloride: 104 mEq/L (ref 96–112)
Creat: 0.94 mg/dL (ref 0.50–1.10)
Glucose, Bld: 84 mg/dL (ref 70–99)
Potassium: 4.9 mEq/L (ref 3.5–5.3)
Sodium: 140 mEq/L (ref 135–145)

## 2013-11-12 LAB — POCT INR: INR: 2.8

## 2013-11-12 MED ORDER — WARFARIN SODIUM 5 MG PO TABS: ORAL_TABLET | ORAL | Status: DC

## 2013-11-12 NOTE — Patient Instructions (Signed)
Patient instructed to take medications as defined in the Anti-coagulation Track section of this encounter.  Patient instructed to take today's dose.  Patient verbalized understanding of these instructions.    

## 2013-11-12 NOTE — Progress Notes (Signed)
Indication: Venous thromboembolism in the setting of Factor V Leiden heterozygosity Duration: Lifelong INR: At target.  Dr. Gladstone Pih assessment and plan were reviewed and I agree with his documentation.

## 2013-11-12 NOTE — Assessment & Plan Note (Signed)
BP Readings from Last 3 Encounters:  11/12/13 131/70  10/23/13 124/59  04/09/13 133/71    Lab Results  Component Value Date   NA 137 10/25/2013   K 5.3 10/25/2013   CREATININE 0.98 10/25/2013    Assessment: Blood pressure control: controlled Progress toward BP goal:  at goal Comments: Patient doesn't recall meds, called pharmacy, she is picking up amlodipine 10 (10/18/13) and lisinopril-hctz 10-12.5 (10/25/13)  Plan: Medications:  continue current medications Educational resources provided:   Self management tools provided:   Other plans: BMET today, K elevated at last visit, meds subsequently changed

## 2013-11-12 NOTE — Patient Instructions (Addendum)
General Instructions: -Your blood pressure looks great today, keep up the good work! You are taking 3 different medications (2 pills) for your blood pressure - amlodipine 10mg  daily and Lisinopril-hydrochlorothiazide 10-12.5mg  daily.    Please be sure to bring all of your medications with you to every visit.  Should you have any new or worsening symptoms, please be sure to call the clinic at [phone].  Treatment Goals:  Goals (1 Years of Data) as of 11/12/13         10/23/13 01/02/13 01/20/12     Exercise    . Exercise 3x per week (30 min per time)   No      Result Component    . HDL > 40  62  60    . LDL CALC < 100  95  114      Progress Toward Treatment Goals:  Treatment Goal 11/12/2013  Blood pressure at goal    Self Care Goals & Plans:  Self Care Goal 11/12/2013  Manage my medications take my medicines as prescribed; bring my medications to every visit; refill my medications on time  Monitor my health -  Eat healthy foods -  Be physically active -    No flowsheet data found.   Care Management & Community Referrals:  Referral 11/12/2013  Referrals made for care management support none needed

## 2013-11-12 NOTE — Progress Notes (Signed)
Anti-Coagulation Progress Note  Chailyn Racette is a 78 y.o. female who is currently on an anti-coagulation regimen.    RECENT RESULTS: Recent results are below, the most recent result is correlated with a dose of 22.5 mg. per week: Lab Results  Component Value Date   INR 2.80 11/12/2013   INR 2.20 10/08/2013   INR 1.70 09/24/2013    ANTI-COAG DOSE: Anticoagulation Dose Instructions as of 11/12/2013     [name] Tue Wed Thu Fri Sat   New Dose 2.5 mg 2.5 mg 2.5 mg 5 mg 2.5 mg 2.5 mg 2.5 mg       ANTICOAG SUMMARY: Anticoagulation Episode Summary   Current INR goal 2.0-3.0  Next INR check 12/03/2013  INR from last check 2.80 (11/12/2013)  Weekly max dose   Target end date Indefinite  [facility]  Preferred lab   Send INR reminders to [facility]   Indications  FACTOR V DEFICIENCY [286.3] Long term current use of anticoagulant [V58.61]        Comments Index VTE (DVT) was 2007. She was subsequently documented as having Leiden Factor V Deficiency. She has indicated a continued interest in remaining upon warfarin. Should re-visit this issue with patient annually. Currently "with the advice and consent of the patient--after review of risks vs. benefits--she elects to stay upon warfarin.      Anticoagulation Care Providers   Provider Role Specialty Phone number   Burman Freestone, MD  Internal Medicine [phone]      ANTICOAG TODAY: Anticoagulation Summary as of 11/12/2013   INR goal 2.0-3.0  Selected INR 2.80 (11/12/2013)  Next INR check 12/03/2013  Target end date Indefinite   Indications  FACTOR V DEFICIENCY [286.3] Long term current use of anticoagulant [V58.61]      Anticoagulation Episode Summary   [facility]   Preferred lab    Send INR reminders to [facility]   Comments Index VTE (DVT) was 2007. She was subsequently documented as having Leiden Factor V Deficiency. She has indicated a continued interest in remaining upon  warfarin. Should re-visit this issue with patient annually. Currently "with the advice and consent of the patient--after review of risks vs. benefits--she elects to stay upon warfarin.    Anticoagulation Care Providers   Provider Role Specialty Phone number   Burman Freestone, MD  Internal Medicine [phone]      PATIENT INSTRUCTIONS: Patient Instructions  Patient instructed to take medications as defined in the Anti-coagulation Track section of this encounter.  Patient instructed to take today's dose.  Patient verbalized understanding of these instructions.       FOLLOW-UP Return in 3 weeks (on 12/03/2013) for Follow up INR at 0900h.  [name], III Pharm.D., CACP

## 2013-11-12 NOTE — Progress Notes (Signed)
Subjective:   Patient ID: Judith [name] female   DOB: [birth year] 78 y.o.   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 At visit on 10/23/13, K was found to be elevated and antihypertensive regimen was changed from lisinopril alone to lisinopril-HCTZ.   Review of Systems: Constitutional: Denies fever, chills, diaphoresis, appetite change and fatigue.  HEENT: Denies photophobia, eye pain, redness, hearing loss, ear pain, congestion, sore throat, rhinorrhea, sneezing, mouth sores, trouble swallowing, neck pain, neck stiffness and tinnitus.  Respiratory: Denies cough, chest tightness, and wheezing. Chronic DOE Cardiovascular: Denies chest pain, palpitations and leg swelling.  Gastrointestinal: Denies nausea, vomiting, abdominal pain, diarrhea, constipation,blood in stool and abdominal distention.  Genitourinary: Denies dysuria, urgency, frequency, hematuria, flank pain and difficulty urinating.  Musculoskeletal: Denies myalgias, back pain, joint swelling, arthralgias and gait problem.  Skin: Denies pallor, rash and wound.  Neurological: Denies seizures, syncope, weakness, numbness and headaches. Rare occasions of dizziness/lightheadedness if she stands too quickly, but this passes quickly as well, no falls but has always been clumsy  Past Medical History  Diagnosis Date  . Hypertension   . Hyperlipidemia   . Pulmonary embolus 2007    and DVT, on chronic anti-coagulation  . Shortness of breath 2011    Thought to be secondary to weight and deconditioning, negative cardiac and pulmonary evaluation: Lexiscan ETT, Echo  (EF nl., +LVH),-CXR, nl PFT's  . Osteoporosis 01/2012    t score -2.5 forearm  . Cancer 1983, 2006    , s/p XRT, partial hysterectomy and coloscopy, then upper vaginectomy  . Stress incontinence, female   . Chronic sinusitis   .  DVT (deep vein thrombosis) in pregnancy     Both lower extremities  . Acid reflux   . Heterozygous factor V Leiden mutation   . Adenocarcinoma of uterus 83  . CIN II (cervical intraepithelial neoplasia II)    Current Outpatient Prescriptions  Medication Sig Dispense Refill  . alendronate (FOSAMAX) 70 MG tablet Take 1 tablet (70 mg total) by mouth once a week. Sundays  12 tablet  3  . amLODipine (NORVASC) 10 MG tablet TAKE 1 TABLET BY MOUTH DAILY  31 tablet  5  . Calcium Carbonate-Vit D-Min (CALTRATE 600+D PLUS PO) Take 1 tablet by mouth daily.       Marland Kitchen lisinopril-hydrochlorothiazide (PRINZIDE,ZESTORETIC) 10-12.5 MG per tablet Take 1 tablet by mouth daily.  30 tablet  2  . Multiple Vitamins-Minerals (CENTRUM) tablet Take 1 tablet by mouth daily.       . pravastatin (PRAVACHOL) 20 MG tablet TAKE 1 TABLET BY MOUTH EVERY DAY  30 tablet  5  . VESICARE 10 MG tablet TAKE 1 TABLET BY MOUTH DAILY  30 tablet  5  . warfarin (COUMADIN) 5 MG tablet TAKE AS DIRECTED BY ANTICOAGULATION CLINIC PROVIDED  31 tablet  3   No current facility-administered medications for this visit.   Family History  Problem Relation Age of Onset  . Heart disease Sister   . Atrial fibrillation Sister   . Hypertension Sister   . Breast cancer Maternal Aunt     Age unknown   History   Social History  . Marital Status: Married    Spouse Name: N/A    Number of Children: N/A  . Years of Education: N/A   Social History Main Topics  .  Smoking status: Never Smoker   . Smokeless tobacco: Never Used  . Alcohol Use: No  . Drug Use: No  . Sexual Activity: No   Other Topics Concern  . None   Social History Narrative   Married, for the second time. She retired from [employer], she was an Teacher, music. 2 sons from first marriage (1 was schizophrenic and committed suicide, the other lives out of state and is doing well).   Husband has 2 sons from previous marriage.    Objective:  Physical Exam: Filed Vitals:    11/12/13 0824  BP: 131/70  Pulse: 98  Temp: 97 F (36.1 C)  TempSrc: Oral  Weight: 230 lb 3.2 oz (104.418 kg)  SpO2: 97%   General: appears as stated age, pleasant  HEENT: PERRL, EOMI, no scleral icterus Cardiac: RRR, no rubs, murmurs or gallops Pulm: clear to auscultation bilaterally, moving normal volumes of air Abd: soft, obese, nontender, nondistended, BS normoactive Ext: warm and well perfused, 1+ pitting pedal edema with support hose in place Neuro: alert and oriented X3, cranial nerves II-XII grossly intact MiniCog: recalls 3/3 words, appropriate clock (numbers placed appropriately with correct time - arms at 11:10)  Assessment & Plan:  Case and care discussed with Dr. Eppie Gibson.  Please see problem oriented charting for further details. Patient to return in 3 months for routine f/u with PCP.

## 2013-11-12 NOTE — Assessment & Plan Note (Signed)
Bennett with 3/3 recall and appropriate clock.

## 2013-11-12 NOTE — Progress Notes (Signed)
Case discussed with Dr. Sharda at time of visit.  We reviewed the resident's history and exam and pertinent patient test results.  I agree with the assessment, diagnosis, and plan of care documented in the resident's note. 

## 2013-11-22 ENCOUNTER — Ambulatory Visit (HOSPITAL_COMMUNITY): Payer: Medicare Other

## 2013-12-03 ENCOUNTER — Ambulatory Visit (INDEPENDENT_AMBULATORY_CARE_PROVIDER_SITE_OTHER): Payer: Medicare Other | Admitting: Pharmacist

## 2013-12-03 DIAGNOSIS — Z7901 Long term (current) use of anticoagulants: Secondary | ICD-10-CM | POA: Diagnosis not present

## 2013-12-03 DIAGNOSIS — D682 Hereditary deficiency of other clotting factors: Secondary | ICD-10-CM

## 2013-12-03 LAB — POCT INR: INR: 2.1

## 2013-12-03 MED ORDER — WARFARIN SODIUM 5 MG PO TABS: ORAL_TABLET | ORAL | Status: DC

## 2013-12-03 NOTE — Progress Notes (Signed)
Anti-Coagulation Progress Note  Judith [name] is a 78 y.o. female who is currently on an anti-coagulation regimen.    RECENT RESULTS: Recent results are below, the most recent result is correlated with a dose of 20 mg. per week: Lab Results  Component Value Date   INR 2.10 12/03/2013   INR 2.80 11/12/2013   INR 2.20 10/08/2013    ANTI-COAG DOSE: Anticoagulation Dose Instructions as of 12/03/2013     [name] Tue Wed Thu Fri Sat   New Dose 2.5 mg 5 mg 2.5 mg 5 mg 2.5 mg 5 mg 2.5 mg       ANTICOAG SUMMARY: Anticoagulation Episode Summary   Current INR goal 2.0-3.0  Next INR check 12/24/2013  INR from last check 2.10 (12/03/2013)  Weekly max dose   Target end date Indefinite  [facility]  Preferred lab   Send INR reminders to [facility]   Indications  FACTOR V DEFICIENCY [286.3] Long term current use of anticoagulant [V58.61]        Comments Index VTE (DVT) was 2007. She was subsequently documented as having Leiden Factor V Deficiency. She has indicated a continued interest in remaining upon warfarin. Should re-visit this issue with patient annually. Currently "with the advice and consent of the patient--after review of risks vs. benefits--she elects to stay upon warfarin.      Anticoagulation Care Providers   Provider Role Specialty Phone number   Burman Freestone, MD  Internal Medicine [phone]      ANTICOAG TODAY: Anticoagulation Summary as of 12/03/2013   INR goal 2.0-3.0  Selected INR 2.10 (12/03/2013)  Next INR check 12/24/2013  Target end date Indefinite   Indications  FACTOR V DEFICIENCY [286.3] Long term current use of anticoagulant [V58.61]      Anticoagulation Episode Summary   [facility]   Preferred lab    Send INR reminders to [facility]   Comments Index VTE (DVT) was 2007. She was subsequently documented as having Leiden Factor V Deficiency. She has indicated a continued interest in remaining upon  warfarin. Should re-visit this issue with patient annually. Currently "with the advice and consent of the patient--after review of risks vs. benefits--she elects to stay upon warfarin.    Anticoagulation Care Providers   Provider Role Specialty Phone number   Burman Freestone, MD  Internal Medicine [phone]      PATIENT INSTRUCTIONS: Patient Instructions  Patient instructed to take medications as defined in the Anti-coagulation Track section of this encounter.  Patient instructed to take today's dose.  Patient verbalized understanding of these instructions.       FOLLOW-UP Return in 3 weeks (on 12/24/2013) for Follow up INR at 0900h.  [name], III Pharm.D., CACP

## 2013-12-03 NOTE — Patient Instructions (Signed)
Patient instructed to take medications as defined in the Anti-coagulation Track section of this encounter.  Patient instructed to take today's dose.  Patient verbalized understanding of these instructions.    

## 2013-12-03 NOTE — Progress Notes (Signed)
Indication: Venous thromboembolism in the setting of Factor V Leiden heterozygosity Duration: Lifelong per patient request INR: At target. Dr. Groce's assessment and plan were reviewed and I agree with his documentation. 

## 2013-12-24 ENCOUNTER — Ambulatory Visit (INDEPENDENT_AMBULATORY_CARE_PROVIDER_SITE_OTHER): Payer: Medicare Other | Admitting: Pharmacist

## 2013-12-24 DIAGNOSIS — D682 Hereditary deficiency of other clotting factors: Secondary | ICD-10-CM | POA: Diagnosis not present

## 2013-12-24 DIAGNOSIS — Z0000 Encounter for general adult medical examination without abnormal findings: Secondary | ICD-10-CM | POA: Diagnosis not present

## 2013-12-24 DIAGNOSIS — Z7901 Long term (current) use of anticoagulants: Secondary | ICD-10-CM | POA: Diagnosis not present

## 2013-12-24 DIAGNOSIS — I10 Essential (primary) hypertension: Secondary | ICD-10-CM | POA: Diagnosis not present

## 2013-12-24 DIAGNOSIS — E785 Hyperlipidemia, unspecified: Secondary | ICD-10-CM | POA: Diagnosis not present

## 2013-12-24 DIAGNOSIS — R6889 Other general symptoms and signs: Secondary | ICD-10-CM | POA: Diagnosis not present

## 2013-12-24 DIAGNOSIS — E875 Hyperkalemia: Secondary | ICD-10-CM | POA: Diagnosis not present

## 2013-12-24 LAB — POCT INR: INR: 2.3

## 2013-12-24 NOTE — Progress Notes (Signed)
Anti-Coagulation Progress Note  Judith [name] is a 78 y.o. female who is currently on an anti-coagulation regimen.    RECENT RESULTS: Recent results are below, the most recent result is correlated with a dose of 25 mg. per week: Lab Results  Component Value Date   INR 2.30 12/24/2013   INR 2.10 12/03/2013   INR 2.80 11/12/2013    ANTI-COAG DOSE: Anticoagulation Dose Instructions as of 12/24/2013     Sun Mon Tue Wed Thu Fri Sat   New Dose 2.5 mg 5 mg 2.5 mg 5 mg 2.5 mg 5 mg 2.5 mg       ANTICOAG SUMMARY: Anticoagulation Episode Summary   Current INR goal 2.0-3.0  Next INR check 01/21/2014  INR from last check 2.30 (12/24/2013)  Weekly max dose   Target end date Indefinite  [facility]  Preferred lab   Send INR reminders to [facility]   Indications  FACTOR V DEFICIENCY [286.3] Long term current use of anticoagulant [V58.61]        Comments Index VTE (DVT) was 2007. She was subsequently documented as having Leiden Factor V Deficiency. She has indicated a continued interest in remaining upon warfarin. Should re-visit this issue with patient annually. Currently "with the advice and consent of the patient--after review of risks vs. benefits--she elects to stay upon warfarin.      Anticoagulation Care Providers   Provider Role Specialty Phone number   Burman Freestone, MD  Internal Medicine [phone]      ANTICOAG TODAY: Anticoagulation Summary as of 12/24/2013   INR goal 2.0-3.0  Selected INR 2.30 (12/24/2013)  Next INR check 01/21/2014  Target end date Indefinite   Indications  FACTOR V DEFICIENCY [286.3] Long term current use of anticoagulant [V58.61]      Anticoagulation Episode Summary   [facility]   Preferred lab    Send INR reminders to [facility]   Comments Index VTE (DVT) was 2007. She was subsequently documented as having Leiden Factor V Deficiency. She has indicated a continued interest in remaining upon  warfarin. Should re-visit this issue with patient annually. Currently "with the advice and consent of the patient--after review of risks vs. benefits--she elects to stay upon warfarin.    Anticoagulation Care Providers   Provider Role Specialty Phone number   Burman Freestone, MD  Internal Medicine [phone]      PATIENT INSTRUCTIONS: Patient Instructions  Patient instructed to take medications as defined in the Anti-coagulation Track section of this encounter.  Patient instructed to take today's dose.  Patient verbalized understanding of these instructions.       FOLLOW-UP Return in 4 weeks (on 01/21/2014) for Follow up INR at 0915h.  [name], III Pharm.D., CACP

## 2013-12-24 NOTE — Patient Instructions (Signed)
Patient instructed to take medications as defined in the Anti-coagulation Track section of this encounter.  Patient instructed to take today's dose.  Patient verbalized understanding of these instructions.    

## 2014-01-04 ENCOUNTER — Other Ambulatory Visit: Payer: Self-pay | Admitting: Internal Medicine

## 2014-01-12 ENCOUNTER — Other Ambulatory Visit: Payer: Self-pay | Admitting: Internal Medicine

## 2014-01-21 ENCOUNTER — Ambulatory Visit (INDEPENDENT_AMBULATORY_CARE_PROVIDER_SITE_OTHER): Payer: Medicare Other | Admitting: Pharmacist

## 2014-01-21 DIAGNOSIS — Z7901 Long term (current) use of anticoagulants: Secondary | ICD-10-CM | POA: Diagnosis not present

## 2014-01-21 DIAGNOSIS — D682 Hereditary deficiency of other clotting factors: Secondary | ICD-10-CM | POA: Diagnosis not present

## 2014-01-21 LAB — POCT INR: INR: 3.8

## 2014-01-21 NOTE — Progress Notes (Signed)
Indication: Venous thromboembolism in the setting of Factor V Leiden heterozygosity Duration: Lifelong per patient request INR: Above target. Dr. Gladstone Pih assessment and plan were reviewed and I agree with his documentation.

## 2014-01-21 NOTE — Progress Notes (Signed)
Anti-Coagulation Progress Note  Judith [name] is a 78 y.o. female who is currently on an anti-coagulation regimen.    RECENT RESULTS: Recent results are below, the most recent result is correlated with a dose of 25 mg. per week: Lab Results  Component Value Date   INR 3.80 01/21/2014   INR 2.30 12/24/2013   INR 2.10 12/03/2013    ANTI-COAG DOSE: Anticoagulation Dose Instructions as of 01/21/2014     Sun Mon Tue Wed Thu Fri Sat   New Dose 2.5 mg 2.5 mg 5 mg 2.5 mg 2.5 mg 5 mg 2.5 mg       ANTICOAG SUMMARY: Anticoagulation Episode Summary   Current INR goal 2.0-3.0  Next INR check 02/18/2014  INR from last check 3.80! (01/21/2014)  Weekly max dose   Target end date Indefinite  [facility]  Preferred lab   Send INR reminders to [facility]   Indications  FACTOR V DEFICIENCY [286.3] Long term current use of anticoagulant [V58.61]        Comments Index VTE (DVT) was 2007. She was subsequently documented as having Leiden Factor V Deficiency. She has indicated a continued interest in remaining upon warfarin. Should re-visit this issue with patient annually. Currently "with the advice and consent of the patient--after review of risks vs. benefits--she elects to stay upon warfarin.      Anticoagulation Care Providers   Provider Role Specialty Phone number   Burman Freestone, MD  Internal Medicine [phone]      ANTICOAG TODAY: Anticoagulation Summary as of 01/21/2014   INR goal 2.0-3.0  Selected INR 3.80! (01/21/2014)  Next INR check 02/18/2014  Target end date Indefinite   Indications  FACTOR V DEFICIENCY [286.3] Long term current use of anticoagulant [V58.61]      Anticoagulation Episode Summary   [facility]   Preferred lab    Send INR reminders to [facility]   Comments Index VTE (DVT) was 2007. She was subsequently documented as having Leiden Factor V Deficiency. She has indicated a continued interest in remaining upon  warfarin. Should re-visit this issue with patient annually. Currently "with the advice and consent of the patient--after review of risks vs. benefits--she elects to stay upon warfarin.    Anticoagulation Care Providers   Provider Role Specialty Phone number   Burman Freestone, MD  Internal Medicine [phone]      PATIENT INSTRUCTIONS: Patient Instructions  Patient instructed to take medications as defined in the Anti-coagulation Track section of this encounter.  Patient instructed to take today's dose.  Patient verbalized understanding of these instructions.       FOLLOW-UP Return in 4 weeks (on 02/18/2014) for Follow up INR at 0900h.  [name], III Pharm.D., CACP

## 2014-01-21 NOTE — Patient Instructions (Signed)
Patient instructed to take medications as defined in the Anti-coagulation Track section of this encounter.  Patient instructed to take today's dose.  Patient verbalized understanding of these instructions.    

## 2014-02-03 IMAGING — CR DG CHEST 2V
2 series · 2 of 2 positions shown · non-contrast
Comparison: 12/15/2011

CLINICAL DATA: Cough

CHEST - 2 VIEW

[w chest pa]
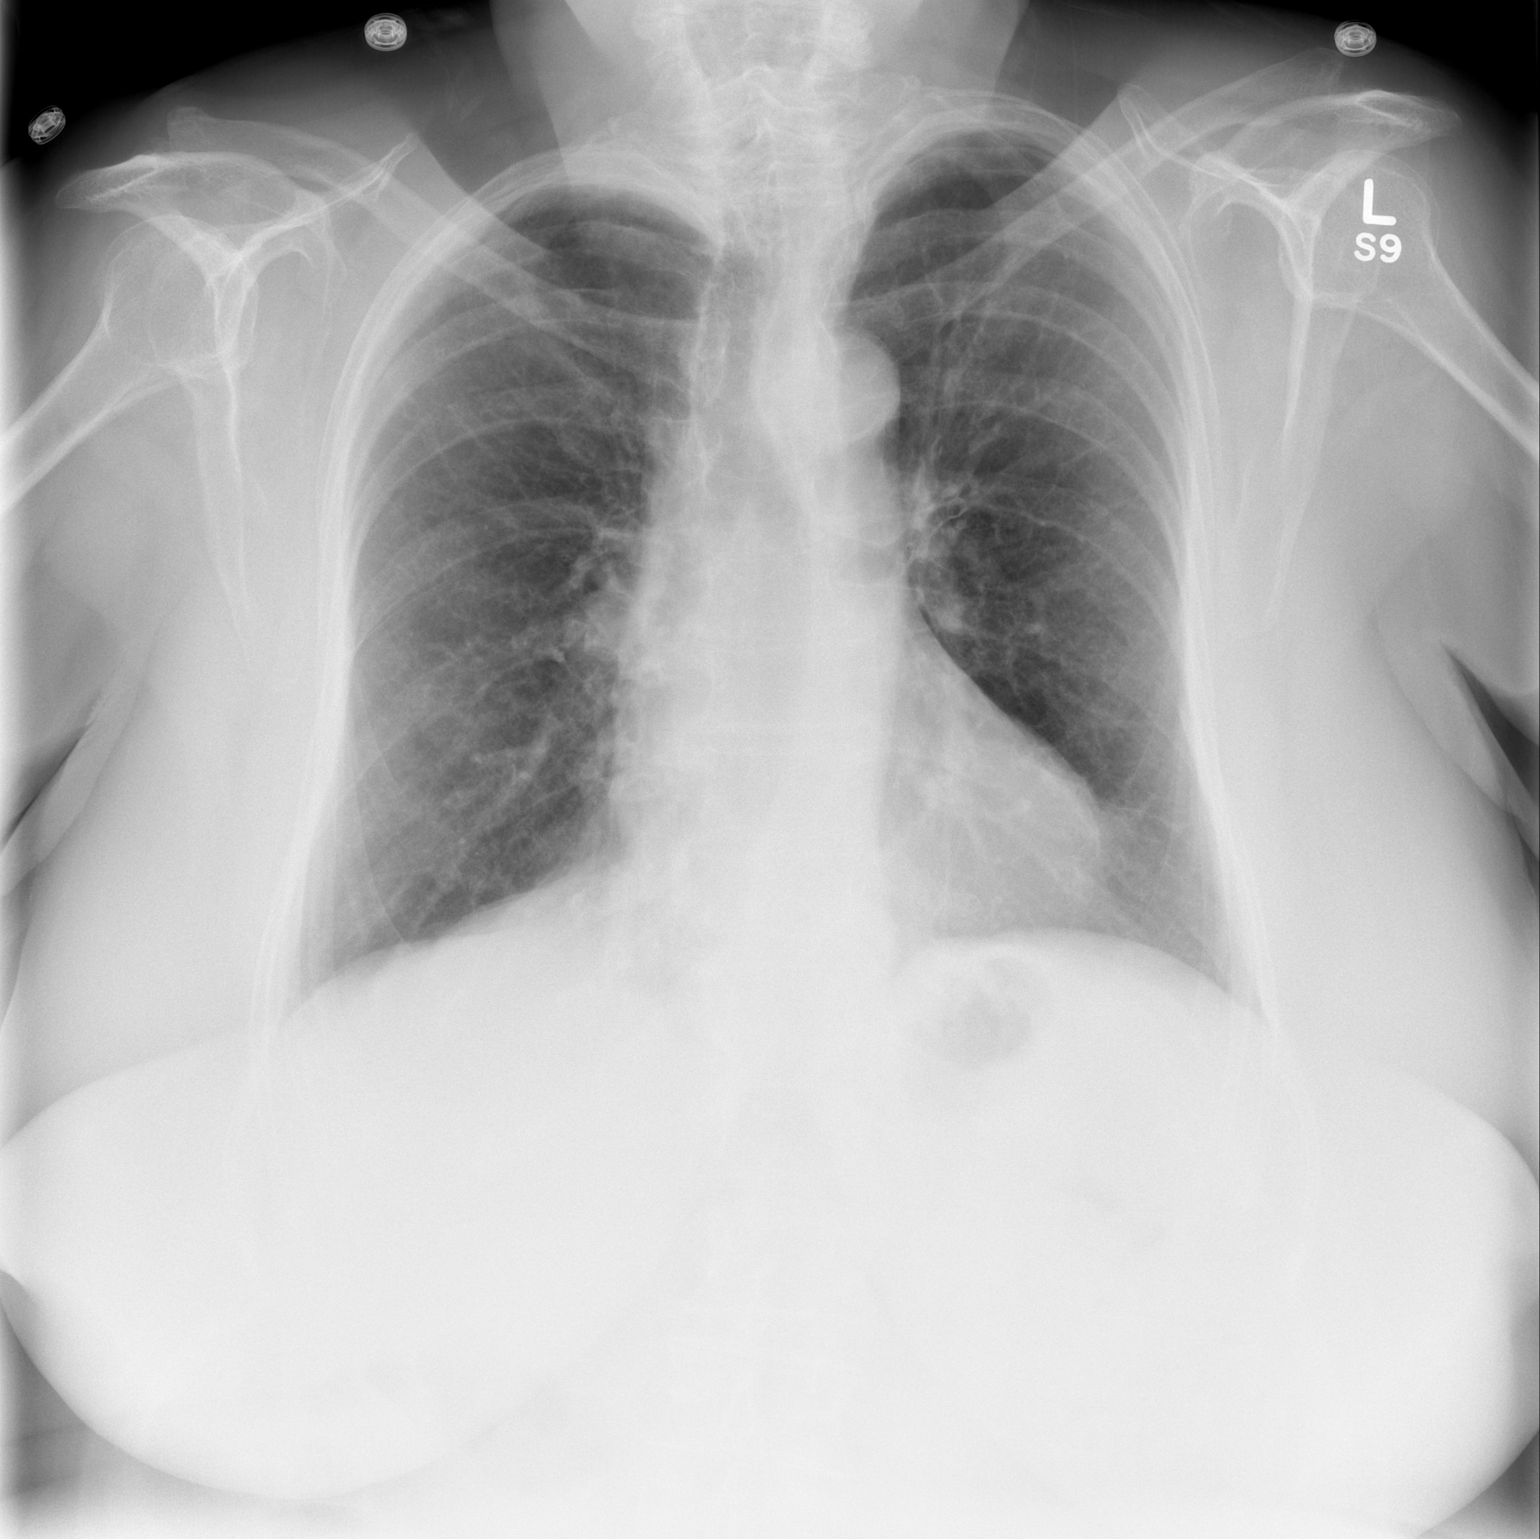

[w chest lat]
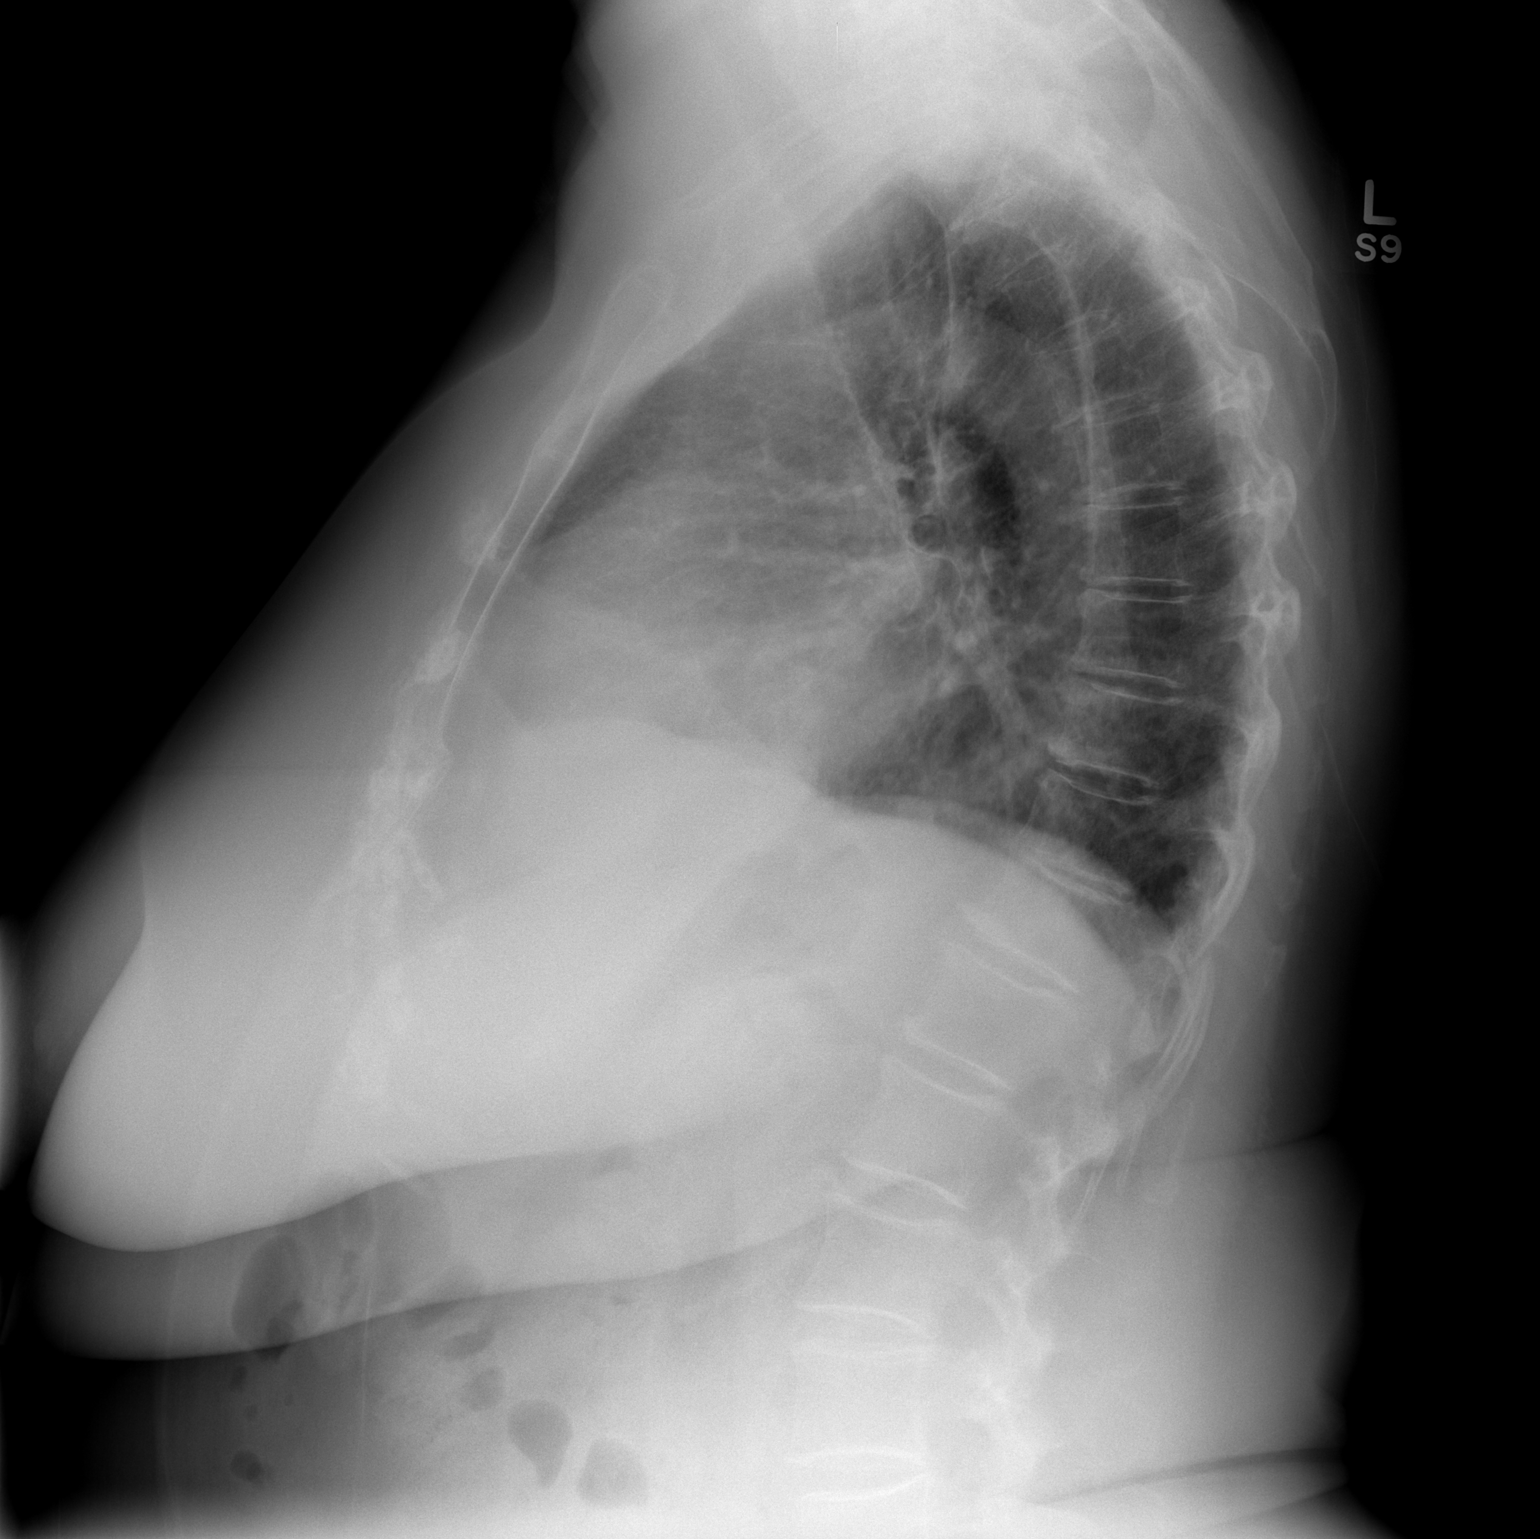

[2 of 2 positions shown; findings below may reference images not displayed]

FINDINGS: Chronic interstitial markings/emphysematous changes.  No
focal consolidation.  No pleural effusion or pneumothorax.

Cardiomediastinal silhouette is within normal limits.

Mild degenerative changes of the visualized thoracolumbar spine.
IMPRESSION: No evidence of acute cardiopulmonary disease.

## 2014-02-18 ENCOUNTER — Ambulatory Visit (INDEPENDENT_AMBULATORY_CARE_PROVIDER_SITE_OTHER): Payer: Medicare Other | Admitting: Pharmacist

## 2014-02-18 DIAGNOSIS — Z7901 Long term (current) use of anticoagulants: Secondary | ICD-10-CM | POA: Diagnosis not present

## 2014-02-18 DIAGNOSIS — D682 Hereditary deficiency of other clotting factors: Secondary | ICD-10-CM

## 2014-02-18 LAB — POCT INR: INR: 4.6

## 2014-02-18 NOTE — Patient Instructions (Signed)
Patient instructed to take medications as defined in the Anti-coagulation Track section of this encounter.  Patient instructed to OMIT today's dose; OMIT tomorrow's dose.   Patient verbalized understanding of these instructions.    

## 2014-02-18 NOTE — Progress Notes (Signed)
Anti-Coagulation Progress Note  Judith [name] is a 78 y.o. female who is currently on an anti-coagulation regimen.    RECENT RESULTS: Recent results are below, the most recent result is correlated with a dose of 22.5 mg. per week: Lab Results  Component Value Date   INR 4.60 02/18/2014   INR 3.80 01/21/2014   INR 2.30 12/24/2013    ANTI-COAG DOSE: Anticoagulation Dose Instructions as of 02/18/2014     [name] Tue Wed Thu Fri Sat   New Dose 2.5 mg 2.5 mg 0 mg 2.5 mg 2.5 mg 2.5 mg 2.5 mg       ANTICOAG SUMMARY: Anticoagulation Episode Summary   Current INR goal 2.0-3.0  Next INR check 02/25/2014  INR from last check 4.60! (02/18/2014)  Weekly max dose   Target end date Indefinite  [facility]  Preferred lab   Send INR reminders to [facility]   Indications  FACTOR V DEFICIENCY [286.3] Long term current use of anticoagulant [V58.61]        Comments Index VTE (DVT) was 2007. She was subsequently documented as having Leiden Factor V Deficiency. She has indicated a continued interest in remaining upon warfarin. Should re-visit this issue with patient annually. Currently "with the advice and consent of the patient--after review of risks vs. benefits--she elects to stay upon warfarin.      Anticoagulation Care Providers   Provider Role Specialty Phone number   Burman Freestone, MD  Internal Medicine [phone]      ANTICOAG TODAY: Anticoagulation Summary as of 02/18/2014   INR goal 2.0-3.0  Selected INR 4.60! (02/18/2014)  Next INR check 02/25/2014  Target end date Indefinite   Indications  FACTOR V DEFICIENCY [286.3] Long term current use of anticoagulant [V58.61]      Anticoagulation Episode Summary   [facility] check [facility]   Preferred lab    Send INR reminders to [facility]   Comments Index VTE (DVT) was 2007. She was subsequently documented as having Leiden Factor V Deficiency. She has indicated a continued interest in remaining  upon warfarin. Should re-visit this issue with patient annually. Currently "with the advice and consent of the patient--after review of risks vs. benefits--she elects to stay upon warfarin.    Anticoagulation Care Providers   Provider Role Specialty Phone number   Burman Freestone, MD  Internal Medicine [phone]      PATIENT INSTRUCTIONS: Patient Instructions  Patient instructed to take medications as defined in the Anti-coagulation Track section of this encounter.  Patient instructed to OMIT today's dose. OMIT tomorrow's dose. Patient verbalized understanding of these instructions.       FOLLOW-UP Return in 7 days (on 02/25/2014) for Follow up INR at [place], III Pharm.D., CACP

## 2014-02-25 ENCOUNTER — Ambulatory Visit (INDEPENDENT_AMBULATORY_CARE_PROVIDER_SITE_OTHER): Payer: Medicare Other | Admitting: Pharmacist

## 2014-02-25 DIAGNOSIS — Z7901 Long term (current) use of anticoagulants: Secondary | ICD-10-CM | POA: Diagnosis not present

## 2014-02-25 DIAGNOSIS — D682 Hereditary deficiency of other clotting factors: Secondary | ICD-10-CM

## 2014-02-25 LAB — POCT INR: INR: 2.1

## 2014-02-25 NOTE — Progress Notes (Signed)
Anti-Coagulation Progress Note  Davinia Riccardi is a 78 y.o. female who is currently on an anti-coagulation regimen.    RECENT RESULTS: Recent results are below, the most recent result is correlated with a dose of 15 mg. per week: Lab Results  Component Value Date   INR 2.10 02/25/2014   INR 4.60 02/18/2014   INR 3.80 01/21/2014    ANTI-COAG DOSE: Anticoagulation Dose Instructions as of 02/25/2014     [name] Tue Wed Thu Fri Sat   New Dose 2.5 mg 2.5 mg 2.5 mg 5 mg 2.5 mg 2.5 mg 2.5 mg       ANTICOAG SUMMARY: Anticoagulation Episode Summary   Current INR goal 2.0-3.0  Next INR check 03/25/2014  INR from last check 2.10 (02/25/2014)  Weekly max dose   Target end date Indefinite  [facility]  Preferred lab   Send INR reminders to [facility]   Indications  FACTOR V DEFICIENCY [286.3] Long term current use of anticoagulant [V58.61]        Comments Index VTE (DVT) was 2007. She was subsequently documented as having Leiden Factor V Deficiency. She has indicated a continued interest in remaining upon warfarin. Should re-visit this issue with patient annually. Currently "with the advice and consent of the patient--after review of risks vs. benefits--she elects to stay upon warfarin.      Anticoagulation Care Providers   Provider Role Specialty Phone number   Burman Freestone, MD  Internal Medicine [phone]      ANTICOAG TODAY: Anticoagulation Summary as of 02/25/2014   INR goal 2.0-3.0  Selected INR 2.10 (02/25/2014)  Next INR check 03/25/2014  Target end date Indefinite   Indications  FACTOR V DEFICIENCY [286.3] Long term current use of anticoagulant [V58.61]      Anticoagulation Episode Summary   [facility]   Preferred lab    Send INR reminders to [facility]   Comments Index VTE (DVT) was 2007. She was subsequently documented as having Leiden Factor V Deficiency. She has indicated a continued interest in remaining upon  warfarin. Should re-visit this issue with patient annually. Currently "with the advice and consent of the patient--after review of risks vs. benefits--she elects to stay upon warfarin.    Anticoagulation Care Providers   Provider Role Specialty Phone number   Burman Freestone, MD  Internal Medicine [phone]      PATIENT INSTRUCTIONS: Patient Instructions  Patient instructed to take medications as defined in the Anti-coagulation Track section of this encounter.  Patient instructed to take today's dose.  Patient verbalized understanding of these instructions.       FOLLOW-UP Return in 4 weeks (on 03/25/2014) for Follow up INR at 0845h.  [name], III Pharm.D., CACP

## 2014-02-25 NOTE — Patient Instructions (Signed)
Patient instructed to take medications as defined in the Anti-coagulation Track section of this encounter.  Patient instructed to take today's dose.  Patient verbalized understanding of these instructions.    

## 2014-02-26 NOTE — Progress Notes (Signed)
Indication: Venous thromboembolism in the setting of Factor V Leiden heterozygosity Duration: Lifelong per patient request INR: At target. Dr. Groce's assessment and plan were reviewed and I agree with his documentation. 

## 2014-03-25 ENCOUNTER — Ambulatory Visit (INDEPENDENT_AMBULATORY_CARE_PROVIDER_SITE_OTHER): Payer: Medicare Other | Admitting: Pharmacist

## 2014-03-25 DIAGNOSIS — D682 Hereditary deficiency of other clotting factors: Secondary | ICD-10-CM

## 2014-03-25 DIAGNOSIS — Z7901 Long term (current) use of anticoagulants: Secondary | ICD-10-CM | POA: Diagnosis not present

## 2014-03-25 LAB — POCT INR: INR: 2.4

## 2014-03-25 NOTE — Patient Instructions (Signed)
Patient instructed to take medications as defined in the Anti-coagulation Track section of this encounter.  Patient instructed to take today's dose.  Patient verbalized understanding of these instructions.    

## 2014-03-25 NOTE — Progress Notes (Signed)
INTERNAL MEDICINE TEACHING ATTENDING ADDENDUM - Cleo Villamizar M.D  Duration- indefinite, Indication- DVT, Factor V Leiden, INR- therapeutic. Agree with Dr. Groce's recommendations as outlined in his note.      

## 2014-03-25 NOTE — Progress Notes (Addendum)
Anti-Coagulation Progress Note  Judith [name] is a 78 y.o. female who is currently on an anti-coagulation regimen.    RECENT RESULTS: Recent results are below, the most recent result is correlated with a dose of 20 mg. per week (no changes): Lab Results  Component Value Date   INR 2.4 03/25/2014   INR 2.10 02/25/2014   INR 4.60 02/18/2014    ANTI-COAG DOSE: Anticoagulation Dose Instructions as of 03/25/2014     Sun Mon Tue Wed Thu Fri Sat   New Dose 2.5 mg 2.5 mg 2.5 mg 5 mg 2.5 mg 2.5 mg 2.5 mg       ANTICOAG SUMMARY: Anticoagulation Episode Summary   Current INR goal 2.0-3.0  Next INR check 04/22/2014  INR from last check 2.4 (03/25/2014)  Weekly max dose   Target end date Indefinite  [facility] check location [facility]  Preferred lab   Send INR reminders to [facility]TICOAG [facility]   Indications  FACTOR V DEFICIENCY [286.3] Long term current use of anticoagulant [V58.61]        Comments Index VTE (DVT) was 2007. She was subsequently documented as having Leiden Factor V Deficiency. She has indicated a continued interest in remaining upon warfarin. Should re-visit this issue with patient annually. Currently "with the advice and consent of the patient--after review of risks vs. benefits--she elects to stay upon warfarin.      Anticoagulation Care Providers   Provider Role Specialty Phone number   Burman Freestone, MD  Internal Medicine [phone]      ANTICOAG TODAY: Anticoagulation Summary as of 03/25/2014   INR goal 2.0-3.0  Selected INR 2.4 (03/25/2014)  Next INR check 04/22/2014  Target end date Indefinite   Indications  FACTOR V DEFICIENCY [286.3] Long term current use of anticoagulant [V58.61]      Anticoagulation Episode Summary   [facility] check [facility]   Preferred lab    Send INR reminders to [facility]AG [facility]   Comments Index VTE (DVT) was 2007. She was subsequently documented as having Leiden Factor V Deficiency. She has indicated a continued interest in  remaining upon warfarin. Should re-visit this issue with patient annually. Currently "with the advice and consent of the patient--after review of risks vs. benefits--she elects to stay upon warfarin.    Anticoagulation Care Providers   Provider Role Specialty Phone number   Burman Freestone, MD  Internal Medicine [phone]      PATIENT INSTRUCTIONS: Patient Instructions  Patient instructed to take medications as defined in the Anti-coagulation Track section of this encounter.  Patient instructed to take today's dose.  Patient verbalized understanding of these instructions.       FOLLOW-UP Return in about 4 weeks (around 04/22/2014) for Follow up at 0915.  [name], III Pharm.D., CACP

## 2014-04-16 ENCOUNTER — Ambulatory Visit: Payer: Medicare Other | Admitting: Internal Medicine

## 2014-04-22 ENCOUNTER — Ambulatory Visit (INDEPENDENT_AMBULATORY_CARE_PROVIDER_SITE_OTHER): Payer: Medicare Other | Admitting: Pharmacist

## 2014-04-22 DIAGNOSIS — Z7901 Long term (current) use of anticoagulants: Secondary | ICD-10-CM | POA: Diagnosis not present

## 2014-04-22 DIAGNOSIS — D682 Hereditary deficiency of other clotting factors: Secondary | ICD-10-CM

## 2014-04-22 LAB — POCT INR: INR: 2.2

## 2014-04-22 NOTE — Patient Instructions (Signed)
Patient instructed to take medications as defined in the Anti-coagulation Track section of this encounter.  Patient instructed to take today's dose.  Patient verbalized understanding of these instructions.    

## 2014-04-22 NOTE — Progress Notes (Signed)
Anti-Coagulation Progress Note  Judith [name] is a 78 y.o. female who is currently on an anti-coagulation regimen.    RECENT RESULTS: Recent results are below, the most recent result is correlated with a dose of 20 mg. per week: Lab Results  Component Value Date   INR 2.20 04/22/2014   INR 2.4 03/25/2014   INR 2.10 02/25/2014    ANTI-COAG DOSE: Anticoagulation Dose Instructions as of 04/22/2014     [name] Tue Wed Thu Fri Sat   New Dose 2.5 mg 2.5 mg 2.5 mg 5 mg 2.5 mg 2.5 mg 2.5 mg       ANTICOAG SUMMARY: Anticoagulation Episode Summary   Current INR goal 2.0-3.0  Next INR check 05/20/2014  INR from last check 2.20 (04/22/2014)  Weekly max dose   Target end date Indefinite  [facility]  Preferred lab   Send INR reminders to [facility]   Indications  FACTOR V DEFICIENCY [286.3] Long term current use of anticoagulant [V58.61]        Comments Index VTE (DVT) was 2007. She was subsequently documented as having Leiden Factor V Deficiency. She has indicated a continued interest in remaining upon warfarin. Should re-visit this issue with patient annually. Currently "with the advice and consent of the patient--after review of risks vs. benefits--she elects to stay upon warfarin.      Anticoagulation Care Providers   Provider Role Specialty Phone number   Burman Freestone, MD  Internal Medicine [phone]      ANTICOAG TODAY: Anticoagulation Summary as of 04/22/2014   INR goal 2.0-3.0  Selected INR 2.20 (04/22/2014)  Next INR check 05/20/2014  Target end date Indefinite   Indications  FACTOR V DEFICIENCY [286.3] Long term current use of anticoagulant [V58.61]      Anticoagulation Episode Summary   [facility] check [facility]   Preferred lab    Send INR reminders to [facility]AG [facility]   Comments Index VTE (DVT) was 2007. She was subsequently documented as having Leiden Factor V Deficiency. She has indicated a continued interest in remaining upon  warfarin. Should re-visit this issue with patient annually. Currently "with the advice and consent of the patient--after review of risks vs. benefits--she elects to stay upon warfarin.    Anticoagulation Care Providers   Provider Role Specialty Phone number   Burman Freestone, MD  Internal Medicine [phone]      PATIENT INSTRUCTIONS: Patient Instructions  Patient instructed to take medications as defined in the Anti-coagulation Track section of this encounter.  Patient instructed to take today's dose.  Patient verbalized understanding of these instructions.       FOLLOW-UP Return in 4 weeks (on 05/20/2014) for Follow up INR at 0900h.  [name], III Pharm.D., CACP

## 2014-04-23 ENCOUNTER — Other Ambulatory Visit: Payer: Self-pay | Admitting: Internal Medicine

## 2014-04-29 ENCOUNTER — Encounter: Payer: Self-pay | Admitting: Gynecology

## 2014-05-01 ENCOUNTER — Ambulatory Visit (INDEPENDENT_AMBULATORY_CARE_PROVIDER_SITE_OTHER): Payer: Medicare Other | Admitting: Gynecology

## 2014-05-01 ENCOUNTER — Encounter: Payer: Self-pay | Admitting: Gynecology

## 2014-05-01 ENCOUNTER — Other Ambulatory Visit (HOSPITAL_COMMUNITY)
Admission: RE | Admit: 2014-05-01 | Discharge: 2014-05-01 | Disposition: A | Discharge status: Home / Self Care | Payer: Medicare Other | Source: Ambulatory Visit | Attending: Gynecology | Admitting: Gynecology

## 2014-05-01 VITALS — BP 150/74 | Ht 64.0 in | Wt 224.0 lb

## 2014-05-01 DIAGNOSIS — M810 Age-related osteoporosis without current pathological fracture: Secondary | ICD-10-CM | POA: Diagnosis not present

## 2014-05-01 DIAGNOSIS — N816 Rectocele: Secondary | ICD-10-CM

## 2014-05-01 DIAGNOSIS — C549 Malignant neoplasm of corpus uteri, unspecified: Secondary | ICD-10-CM | POA: Diagnosis not present

## 2014-05-01 DIAGNOSIS — Z124 Encounter for screening for malignant neoplasm of cervix: Secondary | ICD-10-CM | POA: Insufficient documentation

## 2014-05-01 DIAGNOSIS — N952 Postmenopausal atrophic vaginitis: Secondary | ICD-10-CM | POA: Diagnosis not present

## 2014-05-01 DIAGNOSIS — Z9189 Other specified personal risk factors, not elsewhere classified: Secondary | ICD-10-CM | POA: Diagnosis not present

## 2014-05-01 DIAGNOSIS — C541 Malignant neoplasm of endometrium: Secondary | ICD-10-CM

## 2014-05-01 NOTE — Progress Notes (Signed)
Judith [name] 1936-09-17 [id number]        78 y.o.  G2P2001 for followup exam.  History is remarkable for adenocarcinoma of the uterus stage II status post radiation treatment in 1983. History of DVT and pulmonary embolus in 2007 found to be heterologous for Leiden factor V. 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 Marti Sleigh 2006. History of osteoporosis.   Past medical history,surgical history, problem list, medications, allergies, family history and social history were all reviewed and documented as reviewed in the EPIC chart.  ROS:  12 system ROS performed with pertinent positives and negatives included in the history, assessment and plan.  Included Systems: General, HEENT, Neck, Cardiovascular, Pulmonary, Gastrointestinal, Genitourinary, Musculoskeletal, Dermatologic, Endocrine, Hematological, Neurologic, Psychiatric Additional significant findings :  None   Exam: [name] assistant Filed Vitals:   05/01/14 0757  BP: 150/74  Height: 5\' 4"  (1.626 m)  Weight: 224 lb (101.606 kg)   General appearance:  Normal affect, orientation and appearance. Skin: Grossly normal HEENT: Without gross lesions.  No cervical or supraclavicular adenopathy. Thyroid normal.  Lungs:  Clear without wheezing, rales or rhonchi Cardiac: RR, without RMG Abdominal:  Soft, nontender, without masses, guarding, rebound, organomegaly or hernia Breasts:  Examined lying and sitting without masses, retractions, discharge or axillary adenopathy. Pelvic:  Ext/BUS/vagina with generalized atrophic changes. Shortened vagina with no palpable nodularity or lesions. Pap of cuff done. First degree rectocele noted  Bimanual exam without masses or tenderness    Anus and perineum  Normal   Rectovaginal  Normal sphincter tone without palpated masses or tenderness.    Assessment/Plan:  78 y.o. G36P2001 female for followup exam.   1. History of stage II adenocarcinoma of the  endometrium status post radiation 1983. Subsequent trachelectomy with upper vaginectomy by Dr. Marti Sleigh in 2006 for CIN-2. Followup has been negative. Exam today NED. Pap of cuff done. Continue with annual followup. Need to report any vaginal bleeding reviewed. 2. Osteoporosis. DEXA 01/2012 T score of -2.5. Improved from prior DEXA 2006. Had been on Fosamax since 2006/2007. We stopped her Fosamax last year for drug-free holiday. She is listed as taking the medication but she clearly remembers stopping it. Schedule DEXA now at 2 year interval. Increase calcium and vitamin D reviewed. 3. Urinary incontinence. Patient with mild urinary incontinence. Is on VESIcare per her primary physician. Apparently doing well with this. Check urinalysis today. 4. Mammography 11/2011. Patient reminded that she is over due and needs to schedule and she agrees to do so. SBE monthly reviewed. 5. Colonoscopy 2012. Repeat at their recommended interval. 6. Health maintenance. Blood pressure 150/74. She is being actively followed for hypertension. Numbers reviewed with the patient. No routine blood work done as this is done through her primary physician's office. Follow up for bone density otherwise 1 year, sooner if any issues.   Note: This document was prepared with digital dictation and possible smart phrase technology. Any transcriptional errors that result from this process are unintentional.   Anastasio Auerbach MD, 8:25 AM 05/01/2014

## 2014-05-01 NOTE — Patient Instructions (Signed)
Followup for bone density as scheduled. Schedule your mammogram as you are overdue for this. Followup with me in one year, sooner if any issues. Report any vaginal bleeding.

## 2014-05-01 NOTE — Addendum Note (Signed)
Addended by: Nelva Nay on: 05/01/2014 08:40 AM   Modules accepted: Orders

## 2014-05-02 LAB — URINALYSIS W MICROSCOPIC + REFLEX CULTURE
Bacteria, UA: NONE SEEN
Bilirubin Urine: NEGATIVE
Casts: NONE SEEN
Crystals: NONE SEEN
Glucose, UA: NEGATIVE mg/dL
Hgb urine dipstick: NEGATIVE
Ketones, ur: NEGATIVE mg/dL
Leukocytes, UA: NEGATIVE
Nitrite: NEGATIVE
Protein, ur: NEGATIVE mg/dL
Specific Gravity, Urine: 1.024 (ref 1.005–1.030)
Urobilinogen, UA: 0.2 mg/dL (ref 0.0–1.0)
pH: 5 (ref 5.0–8.0)

## 2014-05-02 LAB — CYTOLOGY - PAP

## 2014-05-03 ENCOUNTER — Other Ambulatory Visit: Payer: Self-pay | Admitting: Internal Medicine

## 2014-05-07 NOTE — Telephone Encounter (Signed)
Message sent to front desk

## 2014-05-07 NOTE — Telephone Encounter (Signed)
The patient needs to come in for appointment before I can refill lisinopril. She has history of hyperkalemia, and I do not see any labs after Jan 2015. Please give the patient an appointnent as soon as possible.

## 2014-05-19 ENCOUNTER — Other Ambulatory Visit: Payer: Self-pay | Admitting: Internal Medicine

## 2014-05-20 ENCOUNTER — Ambulatory Visit (INDEPENDENT_AMBULATORY_CARE_PROVIDER_SITE_OTHER): Payer: Medicare Other | Admitting: Pharmacist

## 2014-05-20 DIAGNOSIS — D682 Hereditary deficiency of other clotting factors: Secondary | ICD-10-CM | POA: Diagnosis not present

## 2014-05-20 DIAGNOSIS — Z7901 Long term (current) use of anticoagulants: Secondary | ICD-10-CM

## 2014-05-20 LAB — POCT INR: INR: 1.9

## 2014-05-20 NOTE — Patient Instructions (Signed)
Patient instructed to take medications as defined in the Anti-coagulation Track section of this encounter.  Patient instructed to take today's dose.  Patient verbalized understanding of these instructions.    

## 2014-05-20 NOTE — Progress Notes (Signed)
Anti-Coagulation Progress Note  Piccola Arico is a 78 y.o. female who is currently on an anti-coagulation regimen.    RECENT RESULTS: Recent results are below, the most recent result is correlated with a dose of 20 mg. per week: Lab Results  Component Value Date   INR 1.90 05/20/2014   INR 2.20 04/22/2014   INR 2.4 03/25/2014    ANTI-COAG DOSE: Anticoagulation Dose Instructions as of 05/20/2014     Sun Mon Tue Wed Thu Fri Sat   New Dose 2.5 mg 5 mg 2.5 mg 2.5 mg 5 mg 2.5 mg 2.5 mg       ANTICOAG SUMMARY: Anticoagulation Episode Summary   Current INR goal 2.0-3.0  Next INR check 06/18/2014  INR from last check 1.90! (05/20/2014)  Weekly max dose   Target end date Indefinite  [facility]  Preferred lab   Send INR reminders to [facility]AG [facility]   Indications  FACTOR V DEFICIENCY [286.3] Long term current use of anticoagulant [V58.61]        Comments Index VTE (DVT) was 2007. She was subsequently documented as having Leiden Factor V Deficiency. She has indicated a continued interest in remaining upon warfarin. Should re-visit this issue with patient annually. Currently "with the advice and consent of the patient--after review of risks vs. benefits--she elects to stay upon warfarin.      Anticoagulation Care Providers   Provider Role Specialty Phone number   Burman Freestone, MD  Internal Medicine [phone]      ANTICOAG TODAY: Anticoagulation Summary as of 05/20/2014   INR goal 2.0-3.0  Selected INR 1.90! (05/20/2014)  Next INR check 06/18/2014  Target end date Indefinite   Indications  FACTOR V DEFICIENCY [286.3] Long term current use of anticoagulant [V58.61]      Anticoagulation Episode Summary   [facility] check [facility]   Preferred lab    Send INR reminders to [facility]AG [facility]   Comments Index VTE (DVT) was 2007. She was subsequently documented as having Leiden Factor V Deficiency. She has indicated a continued interest in remaining upon  warfarin. Should re-visit this issue with patient annually. Currently "with the advice and consent of the patient--after review of risks vs. benefits--she elects to stay upon warfarin.    Anticoagulation Care Providers   Provider Role Specialty Phone number   Burman Freestone, MD  Internal Medicine [phone]      PATIENT INSTRUCTIONS: Patient Instructions  Patient instructed to take medications as defined in the Anti-coagulation Track section of this encounter.  Patient instructed to take today's dose.  Patient verbalized understanding of these instructions.       FOLLOW-UP Return in 4 weeks (on 06/18/2014) for Follow up INR at 0930 before seeing Dr. Ellwood Dense at [phone].  [name], III Pharm.D., CACP

## 2014-05-21 ENCOUNTER — Encounter: Payer: Medicare Other | Admitting: Internal Medicine

## 2014-06-13 ENCOUNTER — Ambulatory Visit (INDEPENDENT_AMBULATORY_CARE_PROVIDER_SITE_OTHER): Payer: Medicare Other

## 2014-06-13 DIAGNOSIS — M810 Age-related osteoporosis without current pathological fracture: Secondary | ICD-10-CM

## 2014-06-14 ENCOUNTER — Encounter: Payer: Self-pay | Admitting: Internal Medicine

## 2014-06-14 ENCOUNTER — Ambulatory Visit (INDEPENDENT_AMBULATORY_CARE_PROVIDER_SITE_OTHER): Payer: Medicare Other | Admitting: Internal Medicine

## 2014-06-14 ENCOUNTER — Ambulatory Visit (HOSPITAL_COMMUNITY)
Admission: RE | Admit: 2014-06-14 | Discharge: 2014-06-14 | Disposition: A | Discharge status: Home / Self Care | Payer: Medicare Other | Source: Ambulatory Visit | Attending: Internal Medicine | Admitting: Internal Medicine

## 2014-06-14 VITALS — BP 172/77 | HR 78 | Temp 97.8°F | Ht 64.0 in | Wt 227.4 lb

## 2014-06-14 DIAGNOSIS — E785 Hyperlipidemia, unspecified: Secondary | ICD-10-CM | POA: Diagnosis not present

## 2014-06-14 DIAGNOSIS — R0609 Other forms of dyspnea: Secondary | ICD-10-CM

## 2014-06-14 DIAGNOSIS — M7989 Other specified soft tissue disorders: Secondary | ICD-10-CM | POA: Diagnosis not present

## 2014-06-14 DIAGNOSIS — I10 Essential (primary) hypertension: Secondary | ICD-10-CM | POA: Diagnosis not present

## 2014-06-14 DIAGNOSIS — M810 Age-related osteoporosis without current pathological fracture: Secondary | ICD-10-CM | POA: Diagnosis not present

## 2014-06-14 DIAGNOSIS — R0989 Other specified symptoms and signs involving the circulatory and respiratory systems: Secondary | ICD-10-CM | POA: Diagnosis not present

## 2014-06-14 DIAGNOSIS — R413 Other amnesia: Secondary | ICD-10-CM

## 2014-06-14 DIAGNOSIS — R0600 Dyspnea, unspecified: Secondary | ICD-10-CM | POA: Insufficient documentation

## 2014-06-14 DIAGNOSIS — G3184 Mild cognitive impairment, so stated: Secondary | ICD-10-CM | POA: Diagnosis not present

## 2014-06-14 LAB — CBC
HCT: 37.8 % (ref 36.0–46.0)
Hemoglobin: 13.2 g/dL (ref 12.0–15.0)
MCH: 31.7 pg (ref 26.0–34.0)
MCHC: 34.9 g/dL (ref 30.0–36.0)
MCV: 90.6 fL (ref 78.0–100.0)
Platelets: 328 10*3/uL (ref 150–400)
RBC: 4.17 MIL/uL (ref 3.87–5.11)
RDW: 12.7 % (ref 11.5–15.5)
WBC: 7.8 10*3/uL (ref 4.0–10.5)

## 2014-06-14 NOTE — Assessment & Plan Note (Signed)
Continue pravastatin, direct LDL 119 from last time. Discussed with the patient about how I felt the risk of dementia is not yet conclusive according to studies and that it might more beneficial to takes statin than stop it. Husband and patient both agree.

## 2014-06-14 NOTE — Assessment & Plan Note (Signed)
On fosamax. DEXA done this month.

## 2014-06-14 NOTE — Assessment & Plan Note (Signed)
BP Readings from Last 3 Encounters:  06/14/14 172/77  05/01/14 150/74  11/12/13 131/70   On amlodipine 10 and Lisinopril HCTZ 10-12.5. I will consider changing amlodipine dose to 5 and going up on lisinopril -HCTZ next visit.

## 2014-06-14 NOTE — Assessment & Plan Note (Signed)
Last ECHO in 2011 - mild LVH, EF 41-32% and no diastolic wall motion abnormalities. EKG done normal. Would repeat ECHO. Do not find any clinical signs for decompensated CHF at this time.

## 2014-06-14 NOTE — Progress Notes (Signed)
Subjective:     Patient ID: Judith [name], female   DOB: 1936/06/16, 78 y.o.   MRN: [id number]  HPI Judith [name] comes in with her husband and has two complaints today - progressively worsening forgetfulness, and exertional shortness of breath and leg swelling.  Progressive Forgetfulness - Her husband says that she is forgetting the activities she did soon after she has done them. She is also very forgetful of her appointments, or where she kept certain things. However, she recognizes relations, persons, objects well. She is able to do her ADLs without assistance. She is able to shop with a list, and remembers how to read and write. Husband worried if Pravastatin is responsible for dementia and if it should be stopped.  Exertional Dysnea and Leg swelling -  She has had this for months. She denies orthopnea, or PND. She cannot walk for distances longer than 50 meters without getting short of breath, and these days even taking a shower leaves her breathless.   Review of Systems  Constitutional: Positive for activity change (more sedantary) and fatigue. Negative for fever, chills, diaphoresis, appetite change and unexpected weight change.  HENT: Negative for congestion, sinus pressure and voice change.   Eyes: Negative for visual disturbance.  Respiratory: Positive for shortness of breath. Negative for cough, choking, chest tightness, wheezing and stridor.   Cardiovascular: Positive for leg swelling. Negative for chest pain and palpitations.  Gastrointestinal: Negative for vomiting, diarrhea and constipation.  Musculoskeletal: Positive for arthralgias and back pain. Negative for myalgias.  Skin: Negative for rash and wound.  Neurological: Negative for dizziness, tremors, syncope, facial asymmetry, speech difficulty, weakness, light-headedness, numbness and headaches.  Hematological: Negative for adenopathy.  Psychiatric/Behavioral: Negative for confusion, dysphoric mood and decreased concentration. The  patient is not nervous/anxious.        Objective:   Physical Exam  Constitutional: She is oriented to person, place, and time.  Obese  HENT:  Head: Normocephalic and atraumatic.  Right Ear: External ear normal.  Left Ear: External ear normal.  Mouth/Throat: Oropharynx is clear and moist. No oropharyngeal exudate.  Eyes: Conjunctivae and EOM are normal. Pupils are equal, round, and reactive to light. Right eye exhibits no discharge. Left eye exhibits no discharge.  Neck: Normal range of motion. Neck supple.  Cardiovascular: Normal rate, regular rhythm, normal heart sounds and intact distal pulses.   No murmur heard. Pulmonary/Chest: Effort normal and breath sounds normal. She has no wheezes. She has no rales. She exhibits no tenderness.  Abdominal: Soft. Bowel sounds are normal.  Musculoskeletal: Normal range of motion. She exhibits edema (Bilateral pitting 1-2 +).  Neurological: She is alert and oriented to person, place, and time. She displays normal reflexes. No cranial nerve deficit. She exhibits normal muscle tone. Coordination normal.  Skin: Skin is warm.  Psychiatric: She has a normal mood and affect. Thought content normal.       Assessment & Plan:     Please see problem based charting.

## 2014-06-14 NOTE — Assessment & Plan Note (Signed)
Could be cardiac or amlodipine or varicose veins.Controlled with stockings at this time and elevation and other conservative measures. Will do a TTE, and if that does not explain then would try to decrease amlodipine to 5 and add another medications/increase lisinopril.

## 2014-06-14 NOTE — Assessment & Plan Note (Addendum)
Assessment: MMSE done shows 25/30 (Deficits in Orientation - couldn't tell date and month and hospital floor; Attention and calculation - couldn't spell world backwards completely; Recall - only 2/3 words). I think the patient has MCI. Given family history high likelihood of progression to AD.   Plan:  Referral to geriatrics.   CBC, BMP, TSH today. B12 done within the year was normal.   No need to start Donepezil yet.  Patient seems to have adequate family support.    Would continue statins - for now I think the benefit is greater than the disputed risk of dementia.   Would follow up in 3 months.

## 2014-06-14 NOTE — Patient Instructions (Addendum)
[name], [name] to meet you. We did a memory test called MMSE today, and you did very well on it. I think you have something called MILD COGNITIVE IMPAIRMENT which means you are getting a little forgetful. I have referred you to a geriatrician (a doctor for old people). The clinic will call you with an appointment.   We did an EKG today for your shortness of breath. It is normal.  I will also schedule you for an Echo.   I would recommend that you keep taking your cholesterol medicine.   Thanks, Madilyn Fireman MD MPH 06/14/2014 12:10 PM      Mild Neurocognitive Disorder Mild neurocognitive disorder (formerly known as mild cognitive impairment) is a mental disorder. It is a slight abnormal decrease in mental function. The areas of mental function affected may include memory, thought, communication, behavior, and completion of tasks. The decrease is noticeable and measurable but for the most part does not interfere with your daily activities. Mild neurocognitive disorder typically occurs in people older than 60 years but can occur earlier. It is not as serious as major neurocognitive disorder (formerly known as dementia) but may lead to a more serious neurocognitive disorder. However, in some cases the condition does not get worse. A few people with this disorder even improve. CAUSES  There are a number of different causes of mild neurocognitive disorder:   Brain disorders associated with abnormal protein deposits, such as Alzheimer's disease, Pick's disease, and Lewy body disease.  Brain disorders associated with abnormal movement, such as Parkinson's disease and Huntington's disease.  Diseases affecting blood vessels in the brain and resulting in mini-strokes.  Certain infections, such as human immunodeficiency virus (HIV) infection.  Traumatic brain injury.  Other medical conditions such as brain tumors, underactive thyroid (hypothyroidism), and vitamin B12 deficiency.  Use of  certain prescription medicine and "recreational" drugs. SYMPTOMS  Symptoms of mild neurocognitive disorder include:  Difficulty remembering. You may forget details of recent events, names, or phone numbers. You may forget important social events and appointments or repeatedly forget where you put your car keys.  Difficulty thinking and solving problems. You may have trouble with complex tasks such as paying bills or driving in unfamiliar locations.  Difficulty communicating. You may have trouble finding the right word, naming an object, forming a sentence that makes sense, or understanding what you read or hear.  Changes in your behavior or personality. You may lose interest in the things that you used to enjoy or withdraw from social situations. You may get angry more easily than usual. You may act before thinking. You may do things in public that you would not usually do. You may hear or see things that are not real (hallucinations). You may believe falsely that others are trying to hurt you (paranoia). DIAGNOSIS Mild neurocognitive disorder is diagnosed through an assessment by your health care provider. Your health care provider will ask you and your family, friends, or coworkers questions about your symptoms. He or she will ask how often the symptoms occur, how long they have been occurring, whether they are getting worse, and the effect they are having on your life. Your health care provider may refer you to a neurologist or mental health specialist for a detailed evaluation of your mental functions (neuropsychological testing).  To identify the cause of your mild neurocognitive disorder, your health care provider may:  Obtain a detailed medical history.  Ask about alcohol and drug use, including prescription medicine.  Perform  a physical exam.  Order blood tests and brain imaging exams. TREATMENT  Mild neurocognitive disorder caused by infections, use of certain medicines or  "recreational" drugs, and certain medical conditions may improve with treatment of the condition that is causing the disorder. Mild neurocognitive disorder resulting from other causes generally does not improve and may worsen. In these cases, the goal of treatment is to slow progression of the disorder and help you cope with the loss of mental function. Treatments in these cases include:   Medicine. Medicine helps mainly with memory loss and behavioral symptoms.   Talk therapy. Talk therapy provides education, emotional support, memory aids, and other ways of making up for decreases in mental function.   Lifestyle changes. These include regular exercise, a healthy diet (including essential omega-3 fatty acids), intellectual stimulation, and increased social interaction.  Document Released: 06/27/2013 Document Revised: 03/11/2014 Document Reviewed: 06/27/2013 [facility] Patient Information 2015 [place], [place]. This information is not intended to replace advice given to you by your health care provider. Make sure you discuss any questions you have with your health care provider.

## 2014-06-15 LAB — COMPLETE METABOLIC PANEL WITH GFR
ALT: 14 U/L (ref 0–35)
AST: 22 U/L (ref 0–37)
Albumin: 4 g/dL (ref 3.5–5.2)
Alkaline Phosphatase: 77 U/L (ref 39–117)
BUN: 25 mg/dL — ABNORMAL HIGH (ref 6–23)
CO2: 23 mEq/L (ref 19–32)
Calcium: 9.4 mg/dL (ref 8.4–10.5)
Chloride: 107 mEq/L (ref 96–112)
Creat: 0.83 mg/dL (ref 0.50–1.10)
GFR, Est African American: 79 mL/min
GFR, Est Non African American: 69 mL/min
Glucose, Bld: 83 mg/dL (ref 70–99)
Potassium: 5 mEq/L (ref 3.5–5.3)
Sodium: 141 mEq/L (ref 135–145)
Total Bilirubin: 0.3 mg/dL (ref 0.2–1.2)
Total Protein: 6.7 g/dL (ref 6.0–8.3)

## 2014-06-15 LAB — TSH: TSH: 1.125 u[IU]/mL (ref 0.350–4.500)

## 2014-06-18 ENCOUNTER — Ambulatory Visit (INDEPENDENT_AMBULATORY_CARE_PROVIDER_SITE_OTHER): Payer: Medicare Other | Admitting: Pharmacist

## 2014-06-18 ENCOUNTER — Encounter: Payer: Medicare Other | Admitting: Internal Medicine

## 2014-06-18 DIAGNOSIS — Z7901 Long term (current) use of anticoagulants: Secondary | ICD-10-CM | POA: Diagnosis not present

## 2014-06-18 DIAGNOSIS — D682 Hereditary deficiency of other clotting factors: Secondary | ICD-10-CM

## 2014-06-18 LAB — POCT INR: INR: 3

## 2014-06-18 MED ORDER — WARFARIN SODIUM 5 MG PO TABS: ORAL_TABLET | ORAL | Status: DC

## 2014-06-18 NOTE — Patient Instructions (Signed)
Patient instructed to take medications as defined in the Anti-coagulation Track section of this encounter.  Patient instructed to take today's dose.  Patient verbalized understanding of these instructions.    

## 2014-06-18 NOTE — Progress Notes (Signed)
Anti-Coagulation Progress Note  Judith [name] is a 78 y.o. female who is currently on an anti-coagulation regimen.    RECENT RESULTS: Recent results are below, the most recent result is correlated with a dose of 22.5 mg. per week: Lab Results  Component Value Date   INR 3.0 06/18/2014   INR 1.90 05/20/2014   INR 2.20 04/22/2014    ANTI-COAG DOSE: Anticoagulation Dose Instructions as of 06/18/2014     [name] Tue Wed Thu Fri Sat   New Dose 2.5 mg 2.5 mg 2.5 mg 5 mg 2.5 mg 2.5 mg 2.5 mg       ANTICOAG SUMMARY: Anticoagulation Episode Summary   Current INR goal 2.0-3.0  Next INR check 07/08/2014  INR from last check 3.0 (06/18/2014)  Weekly max dose   Target end date Indefinite  [facility] check location [facility]  Preferred lab   Send INR reminders to [facility]   Indications  FACTOR V DEFICIENCY [286.3] Long term current use of anticoagulant [V58.61]        Comments Index VTE (DVT) was 2007. She was subsequently documented as having Leiden Factor V Deficiency. She has indicated a continued interest in remaining upon warfarin. Should re-visit this issue with patient annually. Currently "with the advice and consent of the patient--after review of risks vs. benefits--she elects to stay upon warfarin.      Anticoagulation Care Providers   Provider Role Specialty Phone number   Burman Freestone, MD  Internal Medicine [phone]      ANTICOAG TODAY: Anticoagulation Summary as of 06/18/2014   INR goal 2.0-3.0  Selected INR 3.0 (06/18/2014)  Next INR check 07/08/2014  Target end date Indefinite   Indications  FACTOR V DEFICIENCY [286.3] Long term current use of anticoagulant [V58.61]      Anticoagulation Episode Summary   [facility] check location [facility]   Preferred lab    Send INR reminders to [facility]   Comments Index VTE (DVT) was 2007. She was subsequently documented as having Leiden Factor V Deficiency. She has indicated a continued interest in remaining upon  warfarin. Should re-visit this issue with patient annually. Currently "with the advice and consent of the patient--after review of risks vs. benefits--she elects to stay upon warfarin.    Anticoagulation Care Providers   Provider Role Specialty Phone number   Burman Freestone, MD  Internal Medicine [phone]      PATIENT INSTRUCTIONS: Patient Instructions  Patient instructed to take medications as defined in the Anti-coagulation Track section of this encounter.  Patient instructed to take today's dose.  Patient verbalized understanding of these instructions.       FOLLOW-UP Return in 3 weeks (on 07/08/2014) for Follow up INR at 0900h.  [name], III Pharm.D., CACP

## 2014-06-21 ENCOUNTER — Telehealth: Payer: Self-pay | Admitting: *Deleted

## 2014-06-24 ENCOUNTER — Encounter: Payer: Self-pay | Admitting: Gynecology

## 2014-07-01 NOTE — Telephone Encounter (Signed)
Discussed with Judith [name] that the geriatrics referral we put in would only be possible if she wanted to transfer her care to the geriatric clinic, as per RN Lela told me. Judith [name] says that she is okay with our care right now and would not like to change.

## 2014-07-08 ENCOUNTER — Ambulatory Visit (INDEPENDENT_AMBULATORY_CARE_PROVIDER_SITE_OTHER): Payer: Medicare Other | Admitting: Pharmacist

## 2014-07-08 DIAGNOSIS — E875 Hyperkalemia: Secondary | ICD-10-CM | POA: Diagnosis not present

## 2014-07-08 DIAGNOSIS — E785 Hyperlipidemia, unspecified: Secondary | ICD-10-CM | POA: Diagnosis not present

## 2014-07-08 DIAGNOSIS — Z0000 Encounter for general adult medical examination without abnormal findings: Secondary | ICD-10-CM | POA: Diagnosis not present

## 2014-07-08 DIAGNOSIS — R6889 Other general symptoms and signs: Secondary | ICD-10-CM | POA: Diagnosis not present

## 2014-07-08 DIAGNOSIS — Z7901 Long term (current) use of anticoagulants: Secondary | ICD-10-CM | POA: Diagnosis not present

## 2014-07-08 DIAGNOSIS — D682 Hereditary deficiency of other clotting factors: Secondary | ICD-10-CM

## 2014-07-08 DIAGNOSIS — I10 Essential (primary) hypertension: Secondary | ICD-10-CM | POA: Diagnosis not present

## 2014-07-08 LAB — POCT INR: INR: 2.9

## 2014-07-08 NOTE — Patient Instructions (Signed)
Patient instructed to take medications as defined in the Anti-coagulation Track section of this encounter.  Patient instructed to take today's dose.  Patient verbalized understanding of these instructions.    

## 2014-07-08 NOTE — Progress Notes (Signed)
Anti-Coagulation Progress Note  Judith [name] is a 77 y.o. female who is currently on an anti-coagulation regimen.    RECENT RESULTS: Recent results are below, the most recent result is correlated with a dose of 20 mg. per week: Lab Results  Component Value Date   INR 2.90 07/08/2014   INR 3.0 06/18/2014   INR 1.90 05/20/2014    ANTI-COAG DOSE: Anticoagulation Dose Instructions as of 07/08/2014     [name] Tue Wed Thu Fri Sat   New Dose 2.5 mg 2.5 mg 2.5 mg 5 mg 2.5 mg 2.5 mg 2.5 mg       ANTICOAG SUMMARY: Anticoagulation Episode Summary   Current INR goal 2.0-3.0  Next INR check 08/05/2014  INR from last check 2.90 (07/08/2014)  Weekly max dose   Target end date Indefinite  [facility] check [facility]  Preferred lab   Send INR reminders to [facility]   Indications  FACTOR V DEFICIENCY [286.3] Long term current use of anticoagulant [V58.61]        Comments Index VTE (DVT) was 2007. She was subsequently documented as having Leiden Factor V Deficiency. She has indicated a continued interest in remaining upon warfarin. Should re-visit this issue with patient annually. Currently "with the advice and consent of the patient--after review of risks vs. benefits--she elects to stay upon warfarin.      Anticoagulation Care Providers   Provider Role Specialty Phone number   Burman Freestone, MD  Internal Medicine [phone]      ANTICOAG TODAY: Anticoagulation Summary as of 07/08/2014   INR goal 2.0-3.0  Selected INR 2.90 (07/08/2014)  Next INR check 08/05/2014  Target end date Indefinite   Indications  FACTOR V DEFICIENCY [286.3] Long term current use of anticoagulant [V58.61]      Anticoagulation Episode Summary   [facility] check [facility]   Preferred lab    Send INR reminders to [facility]   Comments Index VTE (DVT) was 2007. She was subsequently documented as having Leiden Factor V Deficiency. She has indicated a continued interest in remaining upon  warfarin. Should re-visit this issue with patient annually. Currently "with the advice and consent of the patient--after review of risks vs. benefits--she elects to stay upon warfarin.    Anticoagulation Care Providers   Provider Role Specialty Phone number   Burman Freestone, MD  Internal Medicine [phone]      PATIENT INSTRUCTIONS: Patient Instructions  Patient instructed to take medications as defined in the Anti-coagulation Track section of this encounter.  Patient instructed to take today's dose.  Patient verbalized understanding of these instructions.       FOLLOW-UP Return in 4 weeks (on 08/05/2014) for Follow up INR at 0900h.  [name], III Pharm.D., CACP

## 2014-07-09 NOTE — Progress Notes (Signed)
Patient is on anticoagulation for factor 5 deficiency, had previous clots.  INR is 2.9.  Dr. Gladstone Pih note was reviewed.

## 2014-07-12 ENCOUNTER — Ambulatory Visit: Payer: Medicare Other | Admitting: Gynecology

## 2014-07-12 NOTE — Addendum Note (Signed)
Addended by: Hulan Fray on: 07/12/2014 08:19 AM   Modules accepted: Orders

## 2014-07-18 ENCOUNTER — Ambulatory Visit (INDEPENDENT_AMBULATORY_CARE_PROVIDER_SITE_OTHER): Payer: Medicare Other | Admitting: Gynecology

## 2014-07-18 ENCOUNTER — Encounter: Payer: Self-pay | Admitting: Gynecology

## 2014-07-18 DIAGNOSIS — Z008 Encounter for other general examination: Secondary | ICD-10-CM | POA: Diagnosis not present

## 2014-07-18 NOTE — Patient Instructions (Signed)
Follow up with physicians as we discussed.

## 2014-07-18 NOTE — Progress Notes (Signed)
Judith [name] 09/16/1936 [id number]        78 y.o.  G2P2001 Presents with her husband to discuss referral for ongoing medical care. Is not happy being followed in the clinic seeing different physicians and they were wanting a more consistent follow up.  Past medical history,surgical history, problem list, medications, allergies, family history and social history were all reviewed and documented in the EPIC chart.  Directed ROS with pertinent positives and negatives documented in the history of present illness/assessment and plan.   Assessment/Plan:  78 y.o. G2P2001 with a complex medical history to include osteoporosis DVT/pulmonary embolus, heterozygous factor V Leiden dictation, adenocarcinoma of the uterus. I discussed with her and her husband several internal medicine/family practice physicians that they may try to set up an appointment and gave them names and numbers. Patient's husband sees Dr. Reynaldo Minium and she he is going to call back repulsive to see if they would arrange an appointment with his wife.   Note: This document was prepared with digital dictation and possible smart phrase technology. Any transcriptional errors that result from this process are unintentional.   Anastasio Auerbach MD, 12:05 PM 07/18/2014

## 2014-07-23 DIAGNOSIS — Z23 Encounter for immunization: Secondary | ICD-10-CM | POA: Diagnosis not present

## 2014-07-31 DIAGNOSIS — L719 Rosacea, unspecified: Secondary | ICD-10-CM | POA: Diagnosis not present

## 2014-08-05 ENCOUNTER — Ambulatory Visit: Payer: Medicare Other

## 2014-08-26 ENCOUNTER — Ambulatory Visit (INDEPENDENT_AMBULATORY_CARE_PROVIDER_SITE_OTHER): Payer: Medicare Other | Admitting: Pharmacist

## 2014-08-26 DIAGNOSIS — D682 Hereditary deficiency of other clotting factors: Secondary | ICD-10-CM | POA: Diagnosis not present

## 2014-08-26 DIAGNOSIS — Z7901 Long term (current) use of anticoagulants: Secondary | ICD-10-CM | POA: Diagnosis not present

## 2014-08-26 LAB — POCT INR: INR: 2.1

## 2014-08-26 NOTE — Progress Notes (Signed)
Anti-Coagulation Progress Note  Judith [name] is a 78 y.o. female who is currently on an anti-coagulation regimen.    RECENT RESULTS: Recent results are below, the most recent result is correlated with a dose of 20 mg. per week: Lab Results  Component Value Date   INR 2.10 08/26/2014   INR 2.90 07/08/2014   INR 3.0 06/18/2014    ANTI-COAG DOSE: Anticoagulation Dose Instructions as of 08/26/2014     Judith [name] Tue Wed Thu Fri Sat   New Dose 2.5 mg 5 mg 2.5 mg 5 mg 2.5 mg 2.5 mg 2.5 mg       ANTICOAG SUMMARY: Anticoagulation Episode Summary   Current INR goal 2.0-3.0  Next INR check 09/23/2014  INR from last check 2.10 (08/26/2014)  Weekly max dose   Target end date Indefinite  [facility] check [facility]  Preferred lab   Send INR reminders to [facility]   Indications  FACTOR V DEFICIENCY [D68.2] Long term current use of anticoagulant [Z79.01]        Comments Index VTE (DVT) was 2007. She was subsequently documented as having Leiden Factor V Deficiency. She has indicated a continued interest in remaining upon warfarin. Should re-visit this issue with patient annually. Currently "with the advice and consent of the patient--after review of risks vs. benefits--she elects to stay upon warfarin.      Anticoagulation Care Providers   Provider Role Specialty Phone number   Burman Freestone, MD  Internal Medicine [phone]      ANTICOAG TODAY: Anticoagulation Summary as of 08/26/2014   INR goal 2.0-3.0  Selected INR 2.10 (08/26/2014)  Next INR check 09/23/2014  Target end date Indefinite   Indications  FACTOR V DEFICIENCY [D68.2] Long term current use of anticoagulant [Z79.01]      Anticoagulation Episode Summary   [facility] check location [facility]   Preferred lab    Send INR reminders to [facility]   Comments Index VTE (DVT) was 2007. She was subsequently documented as having Leiden Factor V Deficiency. She has indicated a continued interest in remaining  upon warfarin. Should re-visit this issue with patient annually. Currently "with the advice and consent of the patient--after review of risks vs. benefits--she elects to stay upon warfarin.    Anticoagulation Care Providers   Provider Role Specialty Phone number   Burman Freestone, MD  Internal Medicine [phone]      PATIENT INSTRUCTIONS: Patient Instructions  Patient instructed to take medications as defined in the Anti-coagulation Track section of this encounter.  Patient instructed to take today's dose.  Patient verbalized understanding of these instructions.       FOLLOW-UP Return in 4 weeks (on 09/23/2014) for Follow up INR at 0945h.  Judith [name], III Pharm.D., CACP  f

## 2014-08-26 NOTE — Patient Instructions (Signed)
Patient instructed to take medications as defined in the Anti-coagulation Track section of this encounter.  Patient instructed to take today's dose.  Patient verbalized understanding of these instructions.    

## 2014-08-26 NOTE — Progress Notes (Signed)
Indication: Venous thromboembolism in the setting of Factor V Leiden heterozygosity Duration: Lifelong per patient request INR: At target. Dr. Groce's assessment and plan were reviewed and I agree with his documentation. 

## 2014-09-09 ENCOUNTER — Encounter: Payer: Self-pay | Admitting: Gynecology

## 2014-09-23 ENCOUNTER — Ambulatory Visit (INDEPENDENT_AMBULATORY_CARE_PROVIDER_SITE_OTHER): Payer: Medicare Other | Admitting: Pharmacist

## 2014-09-23 DIAGNOSIS — D682 Hereditary deficiency of other clotting factors: Secondary | ICD-10-CM

## 2014-09-23 DIAGNOSIS — Z7901 Long term (current) use of anticoagulants: Secondary | ICD-10-CM | POA: Diagnosis not present

## 2014-09-23 LAB — POCT INR: INR: 2.5

## 2014-09-23 NOTE — Progress Notes (Signed)
Anti-Coagulation Progress Note  Judith [name] is a 78 y.o. female who is currently on an anti-coagulation regimen.    RECENT RESULTS: Recent results are below, the most recent result is correlated with a dose of 22.5 mg. per week: Lab Results  Component Value Date   INR 2.50 09/23/2014   INR 2.10 08/26/2014   INR 2.90 07/08/2014    ANTI-COAG DOSE: Anticoagulation Dose Instructions as of 09/23/2014      [name] Tue Wed Thu Fri Sat   New Dose 2.5 mg 5 mg 2.5 mg 5 mg 2.5 mg 2.5 mg 2.5 mg       ANTICOAG SUMMARY: Anticoagulation Episode Summary    Current INR goal 2.0-3.0  Next INR check 10/21/2014  INR from last check 2.50 (09/23/2014)  Weekly max dose   Target end date Indefinite  [facility] check [facility]  Preferred lab   Send INR reminders to [facility]   Indications  FACTOR V DEFICIENCY [D68.2] Long term current use of anticoagulant [Z79.01]        Comments Index VTE (DVT) was 2007. She was subsequently documented as having Leiden Factor V Deficiency. She has indicated a continued interest in remaining upon warfarin. Should re-visit this issue with patient annually. Currently "with the advice and consent of the patient--after review of risks vs. benefits--she elects to stay upon warfarin.      Anticoagulation Care Providers    Provider Role Specialty Phone number   Burman Freestone, MD  Internal Medicine [phone]      ANTICOAG TODAY: Anticoagulation Summary as of 09/23/2014    INR goal 2.0-3.0  Selected INR 2.50 (09/23/2014)  Next INR check 10/21/2014  Target end date Indefinite   Indications  FACTOR V DEFICIENCY [D68.2] Long term current use of anticoagulant [Z79.01]      Anticoagulation Episode Summary    [facility] check location [facility]   Preferred lab    Send INR reminders to [facility]   Comments Index VTE (DVT) was 2007. She was subsequently documented as having Leiden Factor V Deficiency. She has indicated a continued interest in  remaining upon warfarin. Should re-visit this issue with patient annually. Currently "with the advice and consent of the patient--after review of risks vs. benefits--she elects to stay upon warfarin.    Anticoagulation Care Providers    Provider Role Specialty Phone number   Burman Freestone, MD  Internal Medicine [phone]      PATIENT INSTRUCTIONS: Patient Instructions  Patient instructed to take medications as defined in the Anti-coagulation Track section of this encounter.  Patient instructed to take today's dose.  Patient verbalized understanding of these instructions.       FOLLOW-UP Return in about 4 weeks (around 10/21/2014) for Follow up INR at 0930h.  [name], III Pharm.D., CACP

## 2014-09-23 NOTE — Progress Notes (Signed)
Indication: Venous thromboembolism in the setting of Factor V Leiden heterozygosity Duration: Lifelong per patient request INR: At target. Dr. Groce's assessment and plan were reviewed and I agree with his documentation. 

## 2014-09-23 NOTE — Patient Instructions (Signed)
Patient instructed to take medications as defined in the Anti-coagulation Track section of this encounter.  Patient instructed to take today's dose.  Patient verbalized understanding of these instructions.    

## 2014-09-25 ENCOUNTER — Other Ambulatory Visit: Payer: Self-pay | Admitting: Internal Medicine

## 2014-10-21 ENCOUNTER — Ambulatory Visit (INDEPENDENT_AMBULATORY_CARE_PROVIDER_SITE_OTHER): Payer: Medicare Other | Admitting: Pharmacist

## 2014-10-21 DIAGNOSIS — D682 Hereditary deficiency of other clotting factors: Secondary | ICD-10-CM | POA: Diagnosis not present

## 2014-10-21 DIAGNOSIS — Z7901 Long term (current) use of anticoagulants: Secondary | ICD-10-CM | POA: Diagnosis not present

## 2014-10-21 LAB — POCT INR: INR: 3.8

## 2014-10-21 NOTE — Patient Instructions (Signed)
Patient instructed to take medications as defined in the Anti-coagulation Track section of this encounter.  Patient instructed to take today's dose.  Patient verbalized understanding of these instructions.    

## 2014-10-21 NOTE — Progress Notes (Signed)
Anti-Coagulation Progress Note  Judith [name] is a 78 y.o. female who is currently on an anti-coagulation regimen.    RECENT RESULTS: Recent results are below, the most recent result is correlated with a dose of 22.5 mg. per week: Lab Results  Component Value Date   INR 3.80 10/21/2014   INR 2.50 09/23/2014   INR 2.10 08/26/2014    ANTI-COAG DOSE: Anticoagulation Dose Instructions as of 10/21/2014      [name] Tue Wed Thu Fri Sat   New Dose 2.5 mg 2.5 mg 2.5 mg 2.5 mg 2.5 mg 2.5 mg 2.5 mg       ANTICOAG SUMMARY: Anticoagulation Episode Summary    Current INR goal 2.0-3.0  Next INR check 11/18/2014  INR from last check 3.80! (10/21/2014)  Weekly max dose   Target end date Indefinite  [facility] check location [facility]  Preferred lab   Send INR reminders to [facility]   Indications  FACTOR V DEFICIENCY [D68.2] Long term current use of anticoagulant [Z79.01]        Comments Index VTE (DVT) was 2007. She was subsequently documented as having Leiden Factor V Deficiency. She has indicated a continued interest in remaining upon warfarin. Should re-visit this issue with patient annually. Currently "with the advice and consent of the patient--after review of risks vs. benefits--she elects to stay upon warfarin.      Anticoagulation Care Providers    Provider Role Specialty Phone number   Burman Freestone, MD  Internal Medicine [phone]      ANTICOAG TODAY: Anticoagulation Summary as of 10/21/2014    INR goal 2.0-3.0  Selected INR 3.80! (10/21/2014)  Next INR check 11/18/2014  Target end date Indefinite   Indications  FACTOR V DEFICIENCY [D68.2] Long term current use of anticoagulant [Z79.01]      Anticoagulation Episode Summary    [facility] check location [facility]   Preferred lab    Send INR reminders to [facility]   Comments Index VTE (DVT) was 2007. She was subsequently documented as having Leiden Factor V Deficiency. She has indicated a continued interest  in remaining upon warfarin. Should re-visit this issue with patient annually. Currently "with the advice and consent of the patient--after review of risks vs. benefits--she elects to stay upon warfarin.    Anticoagulation Care Providers    Provider Role Specialty Phone number   Burman Freestone, MD  Internal Medicine [phone]      PATIENT INSTRUCTIONS: Patient Instructions  Patient instructed to take medications as defined in the Anti-coagulation Track section of this encounter.  Patient instructed to take today's dose.  Patient verbalized understanding of these instructions.       FOLLOW-UP Return in 4 weeks (on 11/18/2014) for Follow up INR at 0900h.  [name], III Pharm.D., CACP

## 2014-10-22 NOTE — Progress Notes (Signed)
Judith [name] is on anticoagulation due to a DVT and found to be have factor V deficiency.  INR was elevated.  Coumadin was decreased.  I have reviewed Dr. Gladstone Pih note.

## 2014-11-11 ENCOUNTER — Ambulatory Visit (INDEPENDENT_AMBULATORY_CARE_PROVIDER_SITE_OTHER): Payer: Medicare Other | Admitting: Pharmacist

## 2014-11-11 DIAGNOSIS — Z7901 Long term (current) use of anticoagulants: Secondary | ICD-10-CM | POA: Diagnosis not present

## 2014-11-11 DIAGNOSIS — D682 Hereditary deficiency of other clotting factors: Secondary | ICD-10-CM | POA: Diagnosis not present

## 2014-11-11 LAB — POCT INR: INR: 2.3

## 2014-11-11 NOTE — Progress Notes (Signed)
Anti-Coagulation Progress Note  Judith [name] is a 79 y.o. female who is currently on an anti-coagulation regimen.    RECENT RESULTS: Recent results are below, the most recent result is correlated with a dose of 17.5 mg. per week: Lab Results  Component Value Date   INR 2.30 11/11/2014   INR 3.80 10/21/2014   INR 2.50 09/23/2014    ANTI-COAG DOSE: Anticoagulation Dose Instructions as of 11/11/2014      [name] Tue Wed Thu Fri Sat   New Dose 2.5 mg 2.5 mg 2.5 mg 2.5 mg 2.5 mg 2.5 mg 2.5 mg       ANTICOAG SUMMARY: Anticoagulation Episode Summary    Current INR goal 2.0-3.0  Next INR check 12/09/2014  INR from last check 2.30 (11/11/2014)  Weekly max dose   Target end date Indefinite  [facility] check location [facility]  Preferred lab   Send INR reminders to [facility]   Indications  FACTOR V DEFICIENCY [D68.2] Long term current use of anticoagulant [Z79.01]        Comments Index VTE (DVT) was 2007. She was subsequently documented as having Leiden Factor V Deficiency. She has indicated a continued interest in remaining upon warfarin. Should re-visit this issue with patient annually. Currently "with the advice and consent of the patient--after review of risks vs. benefits--she elects to stay upon warfarin.      Anticoagulation Care Providers    Provider Role Specialty Phone number   Burman Freestone, MD  Internal Medicine [phone]      ANTICOAG TODAY: Anticoagulation Summary as of 11/11/2014    INR goal 2.0-3.0  Selected INR 2.30 (11/11/2014)  Next INR check 12/09/2014  Target end date Indefinite   Indications  FACTOR V DEFICIENCY [D68.2] Long term current use of anticoagulant [Z79.01]      Anticoagulation Episode Summary    [facility] check location [facility]   Preferred lab    Send INR reminders to [facility]   Comments Index VTE (DVT) was 2007. She was subsequently documented as having Leiden Factor V Deficiency. She has indicated a continued interest in remaining  upon warfarin. Should re-visit this issue with patient annually. Currently "with the advice and consent of the patient--after review of risks vs. benefits--she elects to stay upon warfarin.    Anticoagulation Care Providers    Provider Role Specialty Phone number   Burman Freestone, MD  Internal Medicine [phone]      PATIENT INSTRUCTIONS: Patient Instructions  Patient instructed to take medications as defined in the Anti-coagulation Track section of this encounter.  Patient instructed to take today's dose.  Patient verbalized understanding of these instructions.       FOLLOW-UP Return in 4 weeks (on 12/09/2014) for Follow up INR at 0930h.  [name], III Pharm.D., CACP

## 2014-11-11 NOTE — Patient Instructions (Signed)
Patient instructed to take medications as defined in the Anti-coagulation Track section of this encounter.  Patient instructed to take today's dose.  Patient verbalized understanding of these instructions.    

## 2014-11-13 ENCOUNTER — Other Ambulatory Visit: Payer: Self-pay | Admitting: Internal Medicine

## 2014-11-18 ENCOUNTER — Ambulatory Visit: Payer: Medicare Other

## 2014-11-30 ENCOUNTER — Other Ambulatory Visit: Payer: Self-pay | Admitting: Internal Medicine

## 2014-12-02 ENCOUNTER — Other Ambulatory Visit: Payer: Self-pay | Admitting: Internal Medicine

## 2014-12-02 NOTE — Telephone Encounter (Signed)
Please ask the patient to schedule a visit. She has not been in clinic since 06/2014

## 2014-12-08 ENCOUNTER — Other Ambulatory Visit: Payer: Self-pay | Admitting: Internal Medicine

## 2014-12-09 ENCOUNTER — Ambulatory Visit (INDEPENDENT_AMBULATORY_CARE_PROVIDER_SITE_OTHER): Payer: Medicare Other | Admitting: Pharmacist

## 2014-12-09 DIAGNOSIS — Z7901 Long term (current) use of anticoagulants: Secondary | ICD-10-CM

## 2014-12-09 LAB — POCT INR: INR: 2.9

## 2014-12-09 NOTE — Progress Notes (Signed)
Anti-Coagulation Progress Note  Judith [name] is a 80 y.o. female who is currently on an anti-coagulation regimen.    RECENT RESULTS: Recent results are below, the most recent result is correlated with a dose of 17.5 mg. per week: Lab Results  Component Value Date   INR 2.90 12/09/2014   INR 2.30 11/11/2014   INR 3.80 10/21/2014    ANTI-COAG DOSE: Anticoagulation Dose Instructions as of 12/09/2014      [name] Tue Wed Thu Fri Sat   New Dose 2.5 mg 0 mg 2.5 mg 2.5 mg 2.5 mg 2.5 mg 2.5 mg       ANTICOAG SUMMARY: Anticoagulation Episode Summary    Current INR goal 2.0-3.0  Next INR check 12/30/2014  INR from last check 2.90 (12/09/2014)  Weekly max dose   Target end date Indefinite  [facility] check location [facility]  Preferred lab   Send INR reminders to [facility]   Indications  FACTOR V DEFICIENCY [D68.2] Long term current use of anticoagulant [Z79.01]        Comments Index VTE (DVT) was 2007. She was subsequently documented as having Leiden Factor V Deficiency. She has indicated a continued interest in remaining upon warfarin. Should re-visit this issue with patient annually. Currently "with the advice and consent of the patient--after review of risks vs. benefits--she elects to stay upon warfarin.      Anticoagulation Care Providers    Provider Role Specialty Phone number   Burman Freestone, MD  Internal Medicine [phone]      ANTICOAG TODAY: Anticoagulation Summary as of 12/09/2014    INR goal 2.0-3.0  Selected INR 2.90 (12/09/2014)  Next INR check 12/30/2014  Target end date Indefinite   Indications  FACTOR V DEFICIENCY [D68.2] Long term current use of anticoagulant [Z79.01]      Anticoagulation Episode Summary    [facility] check location [facility]   Preferred lab    Send INR reminders to [facility]   Comments Index VTE (DVT) was 2007. She was subsequently documented as having Leiden Factor V Deficiency. She has indicated a continued interest in remaining  upon warfarin. Should re-visit this issue with patient annually. Currently "with the advice and consent of the patient--after review of risks vs. benefits--she elects to stay upon warfarin.    Anticoagulation Care Providers    Provider Role Specialty Phone number   Burman Freestone, MD  Internal Medicine [phone]      PATIENT INSTRUCTIONS: Patient Instructions  Patient instructed to take medications as defined in the Anti-coagulation Track section of this encounter.  Patient instructed to OMIT today's dose--and ALL Monday doses.  Patient verbalized understanding of these instructions.       FOLLOW-UP Return in 3 weeks (on 12/30/2014) for Follow up INR at 0930h.  [name], III Pharm.D., CACP

## 2014-12-09 NOTE — Patient Instructions (Signed)
Patient instructed to take medications as defined in the Anti-coagulation Track section of this encounter.  Patient instructed to OMIT today's dose--and ALL Monday doses.  Patient verbalized understanding of these instructions.

## 2014-12-16 ENCOUNTER — Other Ambulatory Visit: Payer: Self-pay | Admitting: Internal Medicine

## 2014-12-30 ENCOUNTER — Ambulatory Visit (INDEPENDENT_AMBULATORY_CARE_PROVIDER_SITE_OTHER): Payer: Medicare Other | Admitting: Pharmacist

## 2014-12-30 DIAGNOSIS — Z7901 Long term (current) use of anticoagulants: Secondary | ICD-10-CM

## 2014-12-30 DIAGNOSIS — D682 Hereditary deficiency of other clotting factors: Secondary | ICD-10-CM | POA: Diagnosis not present

## 2014-12-30 LAB — POCT INR: INR: 2.4

## 2014-12-30 NOTE — Patient Instructions (Signed)
Patient instructed to take medications as defined in the Anti-coagulation Track section of this encounter.  Patient instructed to take today's dose.  Patient verbalized understanding of these instructions.    

## 2014-12-30 NOTE — Progress Notes (Signed)
Anti-Coagulation Progress Note  Joye Wesenberg is a 79 y.o. female who is currently on an anti-coagulation regimen.    RECENT RESULTS: Recent results are below, the most recent result is correlated with a dose of 15 mg. per week: Lab Results  Component Value Date   INR 2.40 12/30/2014   INR 2.90 12/09/2014   INR 2.30 11/11/2014    ANTI-COAG DOSE: Anticoagulation Dose Instructions as of 12/30/2014      Sun Mon Tue Wed Thu Fri Sat   New Dose 2.5 mg 2.5 mg 2.5 mg 2.5 mg 2.5 mg 2.5 mg 2.5 mg       ANTICOAG SUMMARY: Anticoagulation Episode Summary    Current INR goal 2.0-3.0  Next INR check 01/20/2015  INR from last check 2.40 (12/30/2014)  Weekly max dose   Target end date Indefinite  [facility]  Preferred lab   Send INR reminders to [facility]   Indications  FACTOR V DEFICIENCY [D68.2] Long term current use of anticoagulant [Z79.01]        Comments Index VTE (DVT) was 2007. She was subsequently documented as having Leiden Factor V Deficiency. She has indicated a continued interest in remaining upon warfarin. Should re-visit this issue with patient annually. Currently "with the advice and consent of the patient--after review of risks vs. benefits--she elects to stay upon warfarin.      Anticoagulation Care Providers    Provider Role Specialty Phone number   Burman Freestone, MD  Internal Medicine [phone]      ANTICOAG TODAY: Anticoagulation Summary as of 12/30/2014    INR goal 2.0-3.0  Selected INR 2.40 (12/30/2014)  Next INR check 01/20/2015  Target end date Indefinite   Indications  FACTOR V DEFICIENCY [D68.2] Long term current use of anticoagulant [Z79.01]      Anticoagulation Episode Summary    [facility] check location [facility]   Preferred lab    Send INR reminders to [facility]   Comments Index VTE (DVT) was 2007. She was subsequently documented as having Leiden Factor V Deficiency. She has indicated a continued interest in  remaining upon warfarin. Should re-visit this issue with patient annually. Currently "with the advice and consent of the patient--after review of risks vs. benefits--she elects to stay upon warfarin.    Anticoagulation Care Providers    Provider Role Specialty Phone number   Burman Freestone, MD  Internal Medicine [phone]      PATIENT INSTRUCTIONS: Patient Instructions  Patient instructed to take medications as defined in the Anti-coagulation Track section of this encounter.  Patient instructed to take today's dose.  Patient verbalized understanding of these instructions.       FOLLOW-UP Return in 3 weeks (on 01/20/2015) for Follow up INR at 0915h.  [name], III Pharm.D., CACP

## 2015-01-20 ENCOUNTER — Ambulatory Visit (INDEPENDENT_AMBULATORY_CARE_PROVIDER_SITE_OTHER): Payer: Medicare Other | Admitting: Pharmacist

## 2015-01-20 DIAGNOSIS — D682 Hereditary deficiency of other clotting factors: Secondary | ICD-10-CM | POA: Diagnosis not present

## 2015-01-20 DIAGNOSIS — Z7901 Long term (current) use of anticoagulants: Secondary | ICD-10-CM | POA: Diagnosis not present

## 2015-01-20 LAB — POCT INR: INR: 2.8

## 2015-01-20 NOTE — Patient Instructions (Signed)
Patient instructed to take medications as defined in the Anti-coagulation Track section of this encounter.  Patient instructed to take today's dose.  Patient verbalized understanding of these instructions.    

## 2015-01-20 NOTE — Progress Notes (Signed)
Anti-Coagulation Progress Note  Maxene Byington is a 79 y.o. female who is currently on an anti-coagulation regimen.    RECENT RESULTS: Recent results are below, the most recent result is correlated with a dose of 17.5 mg. per week: Lab Results  Component Value Date   INR 2.80 01/20/2015   INR 2.40 12/30/2014   INR 2.90 12/09/2014    ANTI-COAG DOSE: Anticoagulation Dose Instructions as of 01/20/2015      [name] Tue Wed Thu Fri Sat   New Dose 2.5 mg 2.5 mg 2.5 mg 2.5 mg 2.5 mg 2.5 mg 2.5 mg       ANTICOAG SUMMARY: Anticoagulation Episode Summary    Current INR goal 2.0-3.0  Next INR check 02/17/2015  INR from last check 2.80 (01/20/2015)  Weekly max dose   Target end date Indefinite  [facility] check [facility]  Preferred lab   Send INR reminders to [facility]   Indications  FACTOR V DEFICIENCY [D68.2] Long term current use of anticoagulant [Z79.01]        Comments Index VTE (DVT) was 2007. She was subsequently documented as having Leiden Factor V Deficiency. She has indicated a continued interest in remaining upon warfarin. Should re-visit this issue with patient annually. Currently "with the advice and consent of the patient--after review of risks vs. benefits--she elects to stay upon warfarin.      Anticoagulation Care Providers    Provider Role Specialty Phone number   Burman Freestone, MD  Internal Medicine [phone]      ANTICOAG TODAY: Anticoagulation Summary as of 01/20/2015    INR goal 2.0-3.0  Selected INR 2.80 (01/20/2015)  Next INR check 02/17/2015  Target end date Indefinite   Indications  FACTOR V DEFICIENCY [D68.2] Long term current use of anticoagulant [Z79.01]      Anticoagulation Episode Summary    [facility] check [facility]   Preferred lab    Send INR reminders to [facility]   Comments Index VTE (DVT) was 2007. She was subsequently documented as having Leiden Factor V Deficiency. She has indicated a continued interest in  remaining upon warfarin. Should re-visit this issue with patient annually. Currently "with the advice and consent of the patient--after review of risks vs. benefits--she elects to stay upon warfarin.    Anticoagulation Care Providers    Provider Role Specialty Phone number   Burman Freestone, MD  Internal Medicine [phone]      PATIENT INSTRUCTIONS: Patient Instructions  Patient instructed to take medications as defined in the Anti-coagulation Track section of this encounter.  Patient instructed to take today's dose.  Patient verbalized understanding of these instructions.       FOLLOW-UP Return in 4 weeks (on 02/17/2015) for Follow up INR at 0915h.  [name], III Pharm.D., CACP

## 2015-01-29 ENCOUNTER — Encounter: Payer: Self-pay | Admitting: *Deleted

## 2015-02-09 ENCOUNTER — Other Ambulatory Visit: Payer: Self-pay | Admitting: Internal Medicine

## 2015-02-11 ENCOUNTER — Other Ambulatory Visit: Payer: Self-pay | Admitting: Internal Medicine

## 2015-02-11 ENCOUNTER — Ambulatory Visit: Payer: PRIVATE HEALTH INSURANCE | Admitting: Internal Medicine

## 2015-02-13 ENCOUNTER — Ambulatory Visit (INDEPENDENT_AMBULATORY_CARE_PROVIDER_SITE_OTHER): Payer: Medicare Other | Admitting: Internal Medicine

## 2015-02-13 ENCOUNTER — Encounter: Payer: Self-pay | Admitting: Internal Medicine

## 2015-02-13 ENCOUNTER — Ambulatory Visit (INDEPENDENT_AMBULATORY_CARE_PROVIDER_SITE_OTHER): Payer: Medicare Other | Admitting: Pharmacist

## 2015-02-13 VITALS — BP 148/66 | HR 71 | Temp 97.8°F | Ht 64.0 in | Wt 228.5 lb

## 2015-02-13 DIAGNOSIS — E785 Hyperlipidemia, unspecified: Secondary | ICD-10-CM

## 2015-02-13 DIAGNOSIS — Z0000 Encounter for general adult medical examination without abnormal findings: Secondary | ICD-10-CM

## 2015-02-13 DIAGNOSIS — D682 Hereditary deficiency of other clotting factors: Secondary | ICD-10-CM | POA: Diagnosis not present

## 2015-02-13 DIAGNOSIS — Z7901 Long term (current) use of anticoagulants: Secondary | ICD-10-CM | POA: Diagnosis present

## 2015-02-13 DIAGNOSIS — R0600 Dyspnea, unspecified: Secondary | ICD-10-CM

## 2015-02-13 DIAGNOSIS — I10 Essential (primary) hypertension: Secondary | ICD-10-CM

## 2015-02-13 DIAGNOSIS — R0609 Other forms of dyspnea: Secondary | ICD-10-CM

## 2015-02-13 DIAGNOSIS — Z23 Encounter for immunization: Secondary | ICD-10-CM | POA: Diagnosis not present

## 2015-02-13 DIAGNOSIS — Z8639 Personal history of other endocrine, nutritional and metabolic disease: Secondary | ICD-10-CM

## 2015-02-13 DIAGNOSIS — G3184 Mild cognitive impairment, so stated: Secondary | ICD-10-CM | POA: Diagnosis not present

## 2015-02-13 DIAGNOSIS — M7989 Other specified soft tissue disorders: Secondary | ICD-10-CM

## 2015-02-13 LAB — LIPID PANEL
Cholesterol: 171 mg/dL (ref 0–200)
HDL: 48 mg/dL (ref >=46)
LDL Cholesterol: 84 mg/dL (ref 0–99)
Total CHOL/HDL Ratio: 3.6 Ratio
Triglycerides: 197 mg/dL — ABNORMAL HIGH (ref <150)
VLDL: 39 mg/dL (ref 0–40)

## 2015-02-13 LAB — BASIC METABOLIC PANEL WITH GFR
BUN: 21 mg/dL (ref 6–23)
CO2: 25 mEq/L (ref 19–32)
Calcium: 9.8 mg/dL (ref 8.4–10.5)
Chloride: 103 mEq/L (ref 96–112)
Creat: 0.8 mg/dL (ref 0.50–1.10)
GFR, Est African American: 82 mL/min
GFR, Est Non African American: 71 mL/min
Glucose, Bld: 76 mg/dL (ref 70–99)
Potassium: 4.8 mEq/L (ref 3.5–5.3)
Sodium: 140 mEq/L (ref 135–145)

## 2015-02-13 LAB — POCT INR: INR: 3.5

## 2015-02-13 MED ORDER — ALENDRONATE SODIUM 70 MG PO TABS: 70.0000 mg | ORAL_TABLET | ORAL | Status: DC

## 2015-02-13 NOTE — Progress Notes (Signed)
CLINICAL PHARMACY ANTICOAGULATION MANAGEMENT Judith [name] is a 79 y.o. female who reports to the clinic for monitoring of warfarin treatment.    Indication: factor V Leiden deficiency Duration: indefinite  Anticoagulation Clinic Visit History: Anticoagulation Episode Summary    Current INR goal 2.0-3.0  Next INR check 02/24/2015  INR from last check 3.5! (02/13/2015)  Weekly max dose   Target end date Indefinite  [facility] check [facility]  Preferred lab   Send INR reminders to [facility]   Indications  FACTOR V DEFICIENCY [D68.2] Long term current use of anticoagulant [Z79.01]        Comments Index VTE (DVT) was 2007. She was subsequently documented as having Leiden Factor V Deficiency. She has indicated a continued interest in remaining upon warfarin. Should re-visit this issue with patient annually. Currently "with the advice and consent of the patient--after review of risks vs. benefits--she elects to stay upon warfarin.      Anticoagulation Care Providers    Provider Role Specialty Phone number   Burman Freestone, MD  Internal Medicine [phone]     ASSESSMENT Recent Results: Recent results are below, the most recent result is correlated with a dose of 17.5 mg per week: Lab Results  Component Value Date   INR 3.5 02/13/2015   INR 2.80 01/20/2015   INR 2.40 12/30/2014    INR today: Supratherapeutic  Anticoagulation Dosing: INR as of 02/13/2015 and Previous Dosing Information    INR Dt INR Goal [employer] Sun Mon Tue Wed Thu Fri Sat   02/13/2015 3.5 2.0-3.0 17.5 mg 2.5 mg 2.5 mg 2.5 mg 2.5 mg 2.5 mg 2.5 mg 2.5 mg    Anticoagulation Dose Instructions as of 02/13/2015      Total Sun Mon Tue Wed Thu Fri Sat   New Dose 17.5 mg 2.5 mg 2.5 mg 2.5 mg 2.5 mg 2.5 mg 2.5 mg 2.5 mg     (5 mg x 0.5)  (5 mg x 0.5)  (5 mg x 0.5)  (5 mg x 0.5)  (5 mg x 0.5)  (5 mg x 0.5)  (5 mg x 0.5)                         Description        Hold x 1 dose then resume previous  weekly dose of 17.5 mg. Patient educated on the importance of consistent vitamin K intake      PLAN Patient was educated to hold x 1 dose. Weekly dose was unchanged at this time due to dietary changes.   Patient Instructions  Patient educated about medication as defined in this encounter and verbalized understanding by repeating back instructions provided.    Follow-up Return in about 11 days (around 02/24/2015) for For follow up INR on 02/24/15 at 9am.  Flossie Dibble, PharmD BCPS, BCACP

## 2015-02-13 NOTE — Patient Instructions (Signed)
Patient educated about medication as defined in this encounter and verbalized understanding by repeating back instructions provided.   

## 2015-02-13 NOTE — Patient Instructions (Signed)
[name], It was a pleasure to meet you today.  Today we will do some basic blood work - BMP and lipid profile.  Your INR level is within target range. You DO NOT need to come on Monday for INR check.  You will be given PREVNAR 13 today. I will also give you information about it.  Please lets meet in 6 months.   Thanks, Madilyn Fireman MD MPH 02/13/2015 3:50 PM    Pneumococcal Vaccine, Polyvalent suspension for injection What is this medicine? PNEUMOCOCCAL VACCINE, POLYVALENT (NEU mo KOK al vak SEEN, pol ee VEY luhnt) is a vaccine to prevent pneumococcus bacteria infection. These bacteria are a major cause of ear infections, 'Strep throat' infections, and serious pneumonia, meningitis, or blood infections worldwide. These vaccines help the body to produce antibodies (protective substances) that help your body defend against these bacteria. This vaccine is recommended for infants and young children. This vaccine will not treat an infection. This medicine may be used for other purposes; ask your health care provider or pharmacist if you have questions. COMMON BRAND NAME(S): Prevnar 13 What should I tell my health care provider before I take this medicine? They need to know if you have any of these conditions: -bleeding problems -fever -immune system problems -low platelet count in the blood -seizures -an unusual or allergic reaction to pneumococcal vaccine, diphtheria toxoid, other vaccines, latex, other medicines, foods, dyes, or preservatives -pregnant or trying to get pregnant -breast-feeding How should I use this medicine? This vaccine is for injection into a muscle. It is given by a health care professional. A copy of Vaccine Information Statements will be given before each vaccination. Read this sheet carefully each time. The sheet may change frequently. Talk to your pediatrician regarding the use of this medicine in children. While this drug may be prescribed for children as young  as 77 weeks old for selected conditions, precautions do apply. Overdosage: If you think you have taken too much of this medicine contact a poison control center or emergency room at once. NOTE: This medicine is only for you. Do not share this medicine with others. What if I miss a dose? It is important not to miss your dose. Call your doctor or health care professional if you are unable to keep an appointment. What may interact with this medicine? -medicines for cancer chemotherapy -medicines that suppress your immune function -medicines that treat or prevent blood clots like warfarin, enoxaparin, and dalteparin -steroid medicines like prednisone or cortisone This list may not describe all possible interactions. Give your health care provider a list of all the medicines, herbs, non-prescription drugs, or dietary supplements you use. Also tell them if you smoke, drink alcohol, or use illegal drugs. Some items may interact with your medicine. What should I watch for while using this medicine? Mild fever and pain should go away in 3 days or less. Report any unusual symptoms to your doctor or health care professional. What side effects may I notice from receiving this medicine? Side effects that you should report to your doctor or health care professional as soon as possible: -allergic reactions like skin rash, itching or hives, swelling of the face, lips, or tongue -breathing problems -confused -fever over 102 degrees F -pain, tingling, numbness in the hands or feet -seizures -unusual bleeding or bruising -unusual muscle weakness Side effects that usually do not require medical attention (report to your doctor or health care professional if they continue or are bothersome): -aches and pains -diarrhea -fever  of 102 degrees F or less -headache -irritable -loss of appetite -pain, tender at site where injected -trouble sleeping This list may not describe all possible side effects. Call your  doctor for medical advice about side effects. You may report side effects to FDA at 1-800-FDA-1088. Where should I keep my medicine? This does not apply. This vaccine is given in a clinic, pharmacy, doctor's office, or other health care setting and will not be stored at home. NOTE: This sheet is a summary. It may not cover all possible information. If you have questions about this medicine, talk to your doctor, pharmacist, or health care provider.  2015, Elsevier/Gold Standard. (2009-01-07 10:17:22)

## 2015-02-16 ENCOUNTER — Other Ambulatory Visit: Payer: Self-pay | Admitting: Internal Medicine

## 2015-02-17 ENCOUNTER — Ambulatory Visit: Payer: PRIVATE HEALTH INSURANCE

## 2015-02-17 NOTE — Progress Notes (Signed)
I have reviewed Dr. Julianne Rice note.  Judith [name] is on anticoagulation for factor V leiden, she prefers to stay on anticoagulation.  INR was elevated and she was advised to hold her coumadin for 1 day.

## 2015-02-17 NOTE — Assessment & Plan Note (Signed)
Patient does seem to give a history that seems very worrisome for cardiac/lung symptoms. Will keep monitoring.

## 2015-02-17 NOTE — Assessment & Plan Note (Signed)
Appears stable. Patient not driving. Stable support from family.  We will repeat MMSE next visit/

## 2015-02-17 NOTE — Assessment & Plan Note (Addendum)
Prevnar 13 this visit. BMP and lipid profile this visit.

## 2015-02-17 NOTE — Assessment & Plan Note (Signed)
Stable, controlled with compression stockings. Likely venous stasis and Amlodipine related.

## 2015-02-17 NOTE — Assessment & Plan Note (Signed)
BP Readings from Last 3 Encounters:  02/13/15 148/66  06/14/14 172/77  05/01/14 150/74   Well controlled at this visit, will not make any changes (Lisinopril, HCTZ, amlodipine).

## 2015-02-17 NOTE — Progress Notes (Signed)
   Subjective:    Patient ID: Judith [name], female    DOB: 03/26/1936, 79 y.o.   MRN: [id number]  HPI Judith [name] is a 79 year old lady who is a patient of mine, who has past medical history of hypertension, hyperlipidemia, venous stasis, factor V deficiency and remote history of adenocarcinoma of the uterus. She has recently scored low on MMSE and is likely having Mild Cognitive Impairment. We discussed the following issues this visit:  MCI - Judith [name] comes to the clinic accompanied by her husband who she jokingly says these days serves as her "memory". She has not had any decline in her symptoms, however she has stopped driving as a preventive measure. Her husband takes care of her finances and shopping. She is otherwise fully functional in activities of daily living, can perform moderately complex tasks, can hold a lengthy conversation with comprehension.   Shortness of breath - There was some concern about shortness of breath raised by the patient last visit which had had me order a 2D echo, however it was for some reason never scheduled and done. This visit, the patient denies any shortness of breath. She denies orthopnea and PND. She can walk upto 75-100 yards before stopping for a breath and she thinks that this distance has not changed in a while.   Leg Swelling - Her leg swelling has not worsened and remains in control with her compression stockings which she wears regularly. She is also on Amlodipine.   Hypertension - She reports compliance with her medications. Her initial BP was higher however a repeat was better.    Review of Systems  Constitutional: Negative for activity change and fatigue.  Respiratory: Negative for cough, choking, chest tightness, shortness of breath and wheezing.   Cardiovascular: Positive for leg swelling. Negative for chest pain and palpitations.  Gastrointestinal: Negative for diarrhea and constipation.  Musculoskeletal: Positive for arthralgias. Negative for  myalgias, back pain and gait problem.  Neurological: Negative for dizziness, syncope and headaches.   Objective:   Physical Exam  Constitutional: She is oriented to person, place, and time.  Obese  HENT:  Head: Normocephalic and atraumatic.  Mouth/Throat: Oropharynx is clear and moist. No oropharyngeal exudate.  Eyes: Conjunctivae and EOM are normal. Pupils are equal, round, and reactive to light. Right eye exhibits no discharge. Left eye exhibits no discharge.  Neck: Normal range of motion. Neck supple.  Cardiovascular: Normal rate, regular rhythm, normal heart sounds and intact distal pulses.   No murmur heard. Pulmonary/Chest: Effort normal and breath sounds normal. She has no wheezes. She has no rales. She exhibits no tenderness.  Abdominal: Soft. Bowel sounds are normal.  Musculoskeletal: Normal range of motion. She exhibits edema (Bilateral, wearing compression stockings).  Neurological: She is alert and oriented to person, place, and time.  Skin: Skin is warm.  Psychiatric: She has a normal mood and affect. Her behavior is normal. Judgment and thought content normal.      Assessment & Plan:   Please see problem based charting.

## 2015-02-17 NOTE — Assessment & Plan Note (Signed)
Lab Results  Component Value Date   CHOL 171 02/13/2015   HDL 48 02/13/2015   LDLCALC 84 02/13/2015   LDLDIRECT 119* 10/24/2013   TRIG 197* 02/13/2015   CHOLHDL 3.6 02/13/2015   On Pravastatin. Will continue current treatment.

## 2015-02-24 ENCOUNTER — Ambulatory Visit (INDEPENDENT_AMBULATORY_CARE_PROVIDER_SITE_OTHER): Payer: Medicare Other | Admitting: Pharmacist

## 2015-02-24 DIAGNOSIS — D682 Hereditary deficiency of other clotting factors: Secondary | ICD-10-CM

## 2015-02-24 DIAGNOSIS — Z7901 Long term (current) use of anticoagulants: Secondary | ICD-10-CM | POA: Diagnosis present

## 2015-02-24 LAB — POCT INR: INR: 3

## 2015-02-24 NOTE — Patient Instructions (Signed)
Patient instructed to take medications as defined in the Anti-coagulation Track section of this encounter.  Patient instructed to OMIT today's dose. OMIT doses on Mondays and Thursdays; take 1/2 tablet on all other days.  Patient verbalized understanding of these instructions.

## 2015-02-24 NOTE — Progress Notes (Signed)
Anti-Coagulation Progress Note  Judith [name] is a 79 y.o. female who is currently on an anti-coagulation regimen.    RECENT RESULTS: Recent results are below, the most recent result is correlated with a dose of 15 mg. per week: Lab Results  Component Value Date   INR 3.00 02/24/2015   INR 3.5 02/13/2015   INR 2.80 01/20/2015    ANTI-COAG DOSE: Anticoagulation Dose Instructions as of 02/24/2015      [name] Tue Wed Thu Fri Sat   New Dose 2.5 mg 0 mg 2.5 mg 2.5 mg 0 mg 2.5 mg 2.5 mg       ANTICOAG SUMMARY: Anticoagulation Episode Summary    Current INR goal 2.0-3.0  Next INR check 03/10/2015  INR from last check 3.00 (02/24/2015)  Weekly max dose   Target end date Indefinite  [facility]  Preferred lab   Send INR reminders to [facility]   Indications  FACTOR V DEFICIENCY [D68.2] Long term current use of anticoagulant [Z79.01]        Comments Index VTE (DVT) was 2007. She was subsequently documented as having Leiden Factor V Deficiency. She has indicated a continued interest in remaining upon warfarin. Should re-visit this issue with patient annually. Currently "with the advice and consent of the patient--after review of risks vs. benefits--she elects to stay upon warfarin.      Anticoagulation Care Providers    Provider Role Specialty Phone number   Burman Freestone, MD  Internal Medicine [phone]      ANTICOAG TODAY: Anticoagulation Summary as of 02/24/2015    INR goal 2.0-3.0  Selected INR 3.00 (02/24/2015)  Next INR check 03/10/2015  Target end date Indefinite   Indications  FACTOR V DEFICIENCY [D68.2] Long term current use of anticoagulant [Z79.01]      Anticoagulation Episode Summary    [facility] check [facility]   Preferred lab    Send INR reminders to [facility]AG [facility]   Comments Index VTE (DVT) was 2007. She was subsequently documented as having Leiden Factor V Deficiency. She has indicated a continued interest in remaining  upon warfarin. Should re-visit this issue with patient annually. Currently "with the advice and consent of the patient--after review of risks vs. benefits--she elects to stay upon warfarin.    Anticoagulation Care Providers    Provider Role Specialty Phone number   Burman Freestone, MD  Internal Medicine [phone]      PATIENT INSTRUCTIONS: Patient Instructions  Patient instructed to take medications as defined in the Anti-coagulation Track section of this encounter.  Patient instructed to OMIT today's dose. OMIT doses on Mondays and Thursdays; take 1/2 tablet on all other days.  Patient verbalized understanding of these instructions.       FOLLOW-UP Return in 2 weeks (on 03/10/2015) for Follow up INR at 0945h.  [name], III Pharm.D., CACP

## 2015-02-24 NOTE — Progress Notes (Signed)
INTERNAL MEDICINE TEACHING ATTENDING ADDENDUM - Aldine Contes M.D  Duration- indefinite, Indication- DVT, Factor V Leiden, INR- therapeutic. Agree with Dr. Gladstone Pih recommendations as outlined in his note.

## 2015-03-10 ENCOUNTER — Ambulatory Visit (INDEPENDENT_AMBULATORY_CARE_PROVIDER_SITE_OTHER): Payer: Medicare Other | Admitting: Pharmacist

## 2015-03-10 DIAGNOSIS — D682 Hereditary deficiency of other clotting factors: Secondary | ICD-10-CM

## 2015-03-10 DIAGNOSIS — Z7901 Long term (current) use of anticoagulants: Secondary | ICD-10-CM

## 2015-03-10 LAB — POCT INR: INR: 4.6

## 2015-03-10 NOTE — Progress Notes (Signed)
INTERNAL MEDICINE TEACHING ATTENDING ADDENDUM - Aldine Contes M.D  Duration- indefinite, Indication- DVT, Factor V Leiden, INR- supratherapeutic. Agree with Dr. Gladstone Pih recommendations as outlined in his note.

## 2015-03-10 NOTE — Patient Instructions (Signed)
Patient instructed to take medications as defined in the Anti-coagulation Track section of this encounter.  Patient instructed to OMIT/HOLD today's dose.  Patient verbalized understanding of these instructions.    

## 2015-03-10 NOTE — Progress Notes (Signed)
Anti-Coagulation Progress Note  Judith [name] is a 79 y.o. female who is currently on an anti-coagulation regimen.    RECENT RESULTS: Recent results are below, the most recent result is correlated with a dose of 12.5 mg. per week--by instructions provided--but patient volunteers that she may have been taking differently. Patient has grown increasingly confused/forgetful. I suggested to her husband that is going to perhaps personally supervise the administration of her medications and keep the bottles secured so that she cannot access them in her confused state and possibly be taking too much: Lab Results  Component Value Date   INR 4.60 03/10/2015   INR 3.00 02/24/2015   INR 3.5 02/13/2015    ANTI-COAG DOSE: Anticoagulation Dose Instructions as of 03/10/2015      Sun Mon Tue Wed Thu Fri Sat   New Dose 2.5 mg 0 mg 2.5 mg 0 mg 2.5 mg 0 mg 2.5 mg       ANTICOAG SUMMARY: Anticoagulation Episode Summary    Current INR goal 2.0-3.0  Next INR check 03/17/2015  INR from last check 4.60! (03/10/2015)  Weekly max dose   Target end date Indefinite  [facility]  Preferred lab   Send INR reminders to [facility]   Indications  FACTOR V DEFICIENCY [D68.2] Long term current use of anticoagulant [Z79.01]        Comments Index VTE (DVT) was 2007. She was subsequently documented as having Leiden Factor V Deficiency. She has indicated a continued interest in remaining upon warfarin. Should re-visit this issue with patient annually. Currently "with the advice and consent of the patient--after review of risks vs. benefits--she elects to stay upon warfarin.      Anticoagulation Care Providers    Provider Role Specialty Phone number   Burman Freestone, MD  Internal Medicine [phone]      ANTICOAG TODAY: Anticoagulation Summary as of 03/10/2015    INR goal 2.0-3.0  Selected INR 4.60! (03/10/2015)  Next INR check 03/17/2015  Target end date Indefinite   Indications  FACTOR  V DEFICIENCY [D68.2] Long term current use of anticoagulant [Z79.01]      Anticoagulation Episode Summary    [facility]   Preferred lab    Send INR reminders to ANT[facility]AG [facility]   Comments Index VTE (DVT) was 2007. She was subsequently documented as having Leiden Factor V Deficiency. She has indicated a continued interest in remaining upon warfarin. Should re-visit this issue with patient annually. Currently "with the advice and consent of the patient--after review of risks vs. benefits--she elects to stay upon warfarin.    Anticoagulation Care Providers    Provider Role Specialty Phone number   Burman Freestone, MD  Internal Medicine [phone]      PATIENT INSTRUCTIONS: Patient Instructions  Patient instructed to take medications as defined in the Anti-coagulation Track section of this encounter.  Patient instructed to OMIT/HOLD today's dose.  Patient verbalized understanding of these instructions.       FOLLOW-UP Return in 7 days (on 03/17/2015) for Follow up INR at 0930h.  [name], III Pharm.D., CACP

## 2015-03-13 ENCOUNTER — Telehealth: Payer: Self-pay | Admitting: Pharmacist

## 2015-03-13 NOTE — Telephone Encounter (Signed)
Call to patient to confirm appointment for 03/17/15 at 9:30 lmtcb

## 2015-03-17 ENCOUNTER — Ambulatory Visit (INDEPENDENT_AMBULATORY_CARE_PROVIDER_SITE_OTHER): Payer: Medicare Other | Admitting: Pharmacist

## 2015-03-17 DIAGNOSIS — Z7901 Long term (current) use of anticoagulants: Secondary | ICD-10-CM

## 2015-03-17 DIAGNOSIS — D682 Hereditary deficiency of other clotting factors: Secondary | ICD-10-CM

## 2015-03-17 LAB — POCT INR: INR: 2

## 2015-03-17 NOTE — Progress Notes (Signed)
INTERNAL MEDICINE TEACHING ATTENDING ADDENDUM - Nazaria Riesen M.D  Duration- indefinite, Indication- DVT, Factor V Leiden, INR- therapeutic. Agree with pharmacy recommendations as outlined in their note.      

## 2015-03-17 NOTE — Progress Notes (Signed)
Anti-Coagulation Progress Note  Judith [name] is a 79 y.o. female who is currently on an anti-coagulation regimen.    RECENT RESULTS: Recent results are below, the most recent result is correlated with a dose of 10 mg. per week: Lab Results  Component Value Date   INR 2.00 03/17/2015   INR 4.60 03/10/2015   INR 3.00 02/24/2015    ANTI-COAG DOSE: Anticoagulation Dose Instructions as of 03/17/2015      [name] Tue Wed Thu Fri Sat   New Dose 2.5 mg 2.5 mg 2.5 mg 0 mg 2.5 mg 2.5 mg 2.5 mg       ANTICOAG SUMMARY: Anticoagulation Episode Summary    Current INR goal 2.0-3.0  Next INR check 03/31/2015  INR from last check 2.00 (03/17/2015)  Weekly max dose   Target end date Indefinite  [facility]  Preferred lab   Send INR reminders to [facility]   Indications  FACTOR V DEFICIENCY [D68.2] Long term current use of anticoagulant [Z79.01]        Comments Index VTE (DVT) was 2007. She was subsequently documented as having Leiden Factor V Deficiency. She has indicated a continued interest in remaining upon warfarin. Should re-visit this issue with patient annually. Currently "with the advice and consent of the patient--after review of risks vs. benefits--she elects to stay upon warfarin.      Anticoagulation Care Providers    Provider Role Specialty Phone number   Burman Freestone, MD  Internal Medicine [phone]      ANTICOAG TODAY: Anticoagulation Summary as of 03/17/2015    INR goal 2.0-3.0  Selected INR 2.00 (03/17/2015)  Next INR check 03/31/2015  Target end date Indefinite   Indications  FACTOR V DEFICIENCY [D68.2] Long term current use of anticoagulant [Z79.01]      Anticoagulation Episode Summary    [facility] check [facility]   Preferred lab    Send INR reminders to [facility]   Comments Index VTE (DVT) was 2007. She was subsequently documented as having Leiden Factor V Deficiency. She has indicated a continued interest in remaining  upon warfarin. Should re-visit this issue with patient annually. Currently "with the advice and consent of the patient--after review of risks vs. benefits--she elects to stay upon warfarin.    Anticoagulation Care Providers    Provider Role Specialty Phone number   Burman Freestone, MD  Internal Medicine [phone]      PATIENT INSTRUCTIONS: Patient Instructions  Patient instructed to take medications as defined in the Anti-coagulation Track section of this encounter.  Patient instructed to take today's dose.  Patient verbalized understanding of these instructions.       FOLLOW-UP Return in 2 weeks (on 03/31/2015) for Follow up INR at 0915h.  [name], III Pharm.D., CACP

## 2015-03-17 NOTE — Patient Instructions (Signed)
Patient instructed to take medications as defined in the Anti-coagulation Track section of this encounter.  Patient instructed to take today's dose.  Patient verbalized understanding of these instructions.    

## 2015-03-25 ENCOUNTER — Other Ambulatory Visit: Payer: Self-pay | Admitting: Internal Medicine

## 2015-03-31 ENCOUNTER — Ambulatory Visit (INDEPENDENT_AMBULATORY_CARE_PROVIDER_SITE_OTHER): Payer: Medicare Other | Admitting: Pharmacist

## 2015-03-31 DIAGNOSIS — Z7901 Long term (current) use of anticoagulants: Secondary | ICD-10-CM | POA: Diagnosis present

## 2015-03-31 DIAGNOSIS — D682 Hereditary deficiency of other clotting factors: Secondary | ICD-10-CM | POA: Diagnosis not present

## 2015-03-31 LAB — POCT INR: INR: 1.7

## 2015-03-31 NOTE — Patient Instructions (Signed)
Patient educated about medication as defined in this encounter and verbalized understanding by repeating back instructions provided.   

## 2015-03-31 NOTE — Progress Notes (Signed)
CLINICAL PHARMACIST ANTICOAG NOTE Judith [name] is a 79 y.o. female who reports to the clinic for monitoring of warfarin treatment.    Indication: factor V Leiden deficiency, history of VTE (DVT) Duration: indefinite  Anticoagulation Clinic Visit History: Anticoagulation Episode Summary    Current INR goal 2.0-3.0  Next INR check 04/08/2015  INR from last check 1.7! (03/31/2015)  Weekly max dose   Target end date Indefinite  [facility]  Preferred lab   Send INR reminders to ANTICOAG [facility]   Indications  FACTOR V DEFICIENCY [D68.2] Long term current use of anticoagulant [Z79.01]        Comments Index VTE (DVT) was 2007. She was subsequently documented as having Leiden Factor V Deficiency. She has indicated a continued interest in remaining upon warfarin. Should re-visit this issue with patient annually. Currently "with the advice and consent of the patient--after review of risks vs. benefits--she elects to stay upon warfarin.      Anticoagulation Care Providers    Provider Role Specialty Phone number   Burman Freestone, MD  Internal Medicine [phone]     ASSESSMENT Recent Results: Recent results are below, the most recent result is correlated with a dose of 15 mg per week: Lab Results  Component Value Date   INR 1.7 03/31/2015   INR 2.00 03/17/2015   INR 4.60 03/10/2015    INR today: Subtherapeutic  Anticoagulation Dosing: INR as of 03/31/2015 and Previous Dosing Information    INR Dt INR Goal [employer] Sun Mon Tue Wed Thu Fri Sat   03/31/2015 1.7 2.0-3.0 15 mg 2.5 mg 2.5 mg 2.5 mg 0 mg 2.5 mg 2.5 mg 2.5 mg    Anticoagulation Dose Instructions as of 03/31/2015      Total Sun Mon Tue Wed Thu Fri Sat   New Dose 17.5 mg 2.5 mg 2.5 mg 2.5 mg 2.5 mg 2.5 mg 2.5 mg 2.5 mg     (5 mg x 0.5)  (5 mg x 0.5)  (5 mg x 0.5)  (5 mg x 0.5)  (5 mg x 0.5)  (5 mg x 0.5)  (5 mg x 0.5)                         Description        Consider switching patient to  2.5 mg tablets next visit for easier and optimal dosing.      PLAN Weekly dose was increased by 16% to 17.5 mg per week  Patient Instructions  Patient educated about medication as defined in this encounter and verbalized understanding by repeating back instructions provided.    Follow-up Return in 8 days (on 04/08/2015) for follow-up INR on 04/08/2015 @ 9:30 AM.  Carolin Coy  PharmD Candidate  Flossie Dibble Clinical Pharmacist  15 minutes spent face-to-face with the patient during the encounter. 50% of time spent on education. 50% of time was spent on testing and assessment.

## 2015-04-08 ENCOUNTER — Ambulatory Visit (INDEPENDENT_AMBULATORY_CARE_PROVIDER_SITE_OTHER): Payer: Medicare Other | Admitting: Pharmacist

## 2015-04-08 DIAGNOSIS — D682 Hereditary deficiency of other clotting factors: Secondary | ICD-10-CM

## 2015-04-08 DIAGNOSIS — Z7901 Long term (current) use of anticoagulants: Secondary | ICD-10-CM

## 2015-04-08 LAB — POCT INR: INR: 2

## 2015-04-08 NOTE — Patient Instructions (Signed)
Patient instructed to take medications as defined in the Anti-coagulation Track section of this encounter.  Patient instructed to take today's dose.  Patient verbalized understanding of these instructions.    

## 2015-04-08 NOTE — Progress Notes (Signed)
Indication: Venous thromboembolism in the setting of Factor V Leiden heterozygosity Duration: Lifelong per patient request INR: At target. Dr. Gladstone Pih assessment and plan were reviewed and I agree with his documentation.

## 2015-04-08 NOTE — Progress Notes (Signed)
Anti-Coagulation Progress Note  Judith [name] is a 79 y.o. female who is currently on an anti-coagulation regimen.    RECENT RESULTS: Recent results are below, the most recent result is correlated with a dose of 17.5 mg. per week: Lab Results  Component Value Date   INR 2.00 04/08/2015   INR 1.7 03/31/2015   INR 2.00 03/17/2015    ANTI-COAG DOSE: Anticoagulation Dose Instructions as of 04/08/2015      [name] Tue Wed Thu Fri Sat   New Dose 2.5 mg 2.5 mg 5 mg 2.5 mg 2.5 mg 5 mg 2.5 mg       ANTICOAG SUMMARY: Anticoagulation Episode Summary    Current INR goal 2.0-3.0  Next INR check 04/28/2015  INR from last check 2.00 (04/08/2015)  Weekly max dose   Target end date Indefinite  [facility]  Preferred lab   Send INR reminders to [facility]AG [facility]   Indications  FACTOR V DEFICIENCY [D68.2] Long term current use of anticoagulant [Z79.01]        Comments Index VTE (DVT) was 2007. She was subsequently documented as having Leiden Factor V Deficiency. She has indicated a continued interest in remaining upon warfarin. Should re-visit this issue with patient annually. Currently "with the advice and consent of the patient--after review of risks vs. benefits--she elects to stay upon warfarin.      Anticoagulation Care Providers    Provider Role Specialty Phone number   Burman Freestone, MD  Internal Medicine [phone]      ANTICOAG TODAY: Anticoagulation Summary as of 04/08/2015    INR goal 2.0-3.0  Selected INR 2.00 (04/08/2015)  Next INR check 04/28/2015  Target end date Indefinite   Indications  FACTOR V DEFICIENCY [D68.2] Long term current use of anticoagulant [Z79.01]      Anticoagulation Episode Summary    [facility]   Preferred lab    Send INR reminders to [facility]   Comments Index VTE (DVT) was 2007. She was subsequently documented as having Leiden Factor V Deficiency. She has indicated a continued interest in  remaining upon warfarin. Should re-visit this issue with patient annually. Currently "with the advice and consent of the patient--after review of risks vs. benefits--she elects to stay upon warfarin.    Anticoagulation Care Providers    Provider Role Specialty Phone number   Burman Freestone, MD  Internal Medicine [phone]      PATIENT INSTRUCTIONS: Patient Instructions  Patient instructed to take medications as defined in the Anti-coagulation Track section of this encounter.  Patient instructed to take today's dose.  Patient verbalized understanding of these instructions.       FOLLOW-UP Return in 3 weeks (on 04/28/2015) for Follow up INR at 0900h.  [name], III Pharm.D., CACP

## 2015-04-28 ENCOUNTER — Ambulatory Visit (INDEPENDENT_AMBULATORY_CARE_PROVIDER_SITE_OTHER): Payer: Medicare Other | Admitting: Pharmacist

## 2015-04-28 DIAGNOSIS — D682 Hereditary deficiency of other clotting factors: Secondary | ICD-10-CM | POA: Diagnosis not present

## 2015-04-28 DIAGNOSIS — Z7901 Long term (current) use of anticoagulants: Secondary | ICD-10-CM

## 2015-04-28 LAB — POCT INR: INR: 2.9

## 2015-04-28 NOTE — Patient Instructions (Signed)
Patient instructed to take medications as defined in the Anti-coagulation Track section of this encounter.  Patient instructed to take today's dose.  Patient verbalized understanding of these instructions.    

## 2015-04-28 NOTE — Progress Notes (Signed)
Anti-Coagulation Progress Note  Judith [name] is a 79 y.o. female who is currently on an anti-coagulation regimen.    RECENT RESULTS: Recent results are below, the most recent result is correlated with a dose of 22.5 mg. per week: Decreased to 20mg /wk.  Lab Results  Component Value Date   INR 2.90 04/28/2015   INR 2.00 04/08/2015   INR 1.7 03/31/2015    ANTI-COAG DOSE: Anticoagulation Dose Instructions as of 04/28/2015      [name] Tue Wed Thu Fri Sat   New Dose 5 mg 2.5 mg 2.5 mg 2.5 mg 2.5 mg 2.5 mg 2.5 mg       ANTICOAG SUMMARY: Anticoagulation Episode Summary    Current INR goal 2.0-3.0  Next INR check 05/19/2015  INR from last check 2.90 (04/28/2015)  Weekly max dose   Target end date Indefinite  [facility]  Preferred lab   Send INR reminders to [facility]   Indications  FACTOR V DEFICIENCY [D68.2] Long term current use of anticoagulant [Z79.01]        Comments Index VTE (DVT) was 2007. She was subsequently documented as having Leiden Factor V Deficiency. She has indicated a continued interest in remaining upon warfarin. Should re-visit this issue with patient annually. Currently "with the advice and consent of the patient--after review of risks vs. benefits--she elects to stay upon warfarin.      Anticoagulation Care Providers    Provider Role Specialty Phone number   Burman Freestone, MD  Internal Medicine [phone]      ANTICOAG TODAY: Anticoagulation Summary as of 04/28/2015    INR goal 2.0-3.0  Selected INR 2.90 (04/28/2015)  Next INR check 05/19/2015  Target end date Indefinite   Indications  FACTOR V DEFICIENCY [D68.2] Long term current use of anticoagulant [Z79.01]      Anticoagulation Episode Summary    [facility]   Preferred lab    Send INR reminders to [facility]AG [facility]   Comments Index VTE (DVT) was 2007. She was subsequently documented as having Leiden Factor V Deficiency. She has indicated a  continued interest in remaining upon warfarin. Should re-visit this issue with patient annually. Currently "with the advice and consent of the patient--after review of risks vs. benefits--she elects to stay upon warfarin.    Anticoagulation Care Providers    Provider Role Specialty Phone number   Burman Freestone, MD  Internal Medicine [phone]      PATIENT INSTRUCTIONS: Patient Instructions  Patient instructed to take medications as defined in the Anti-coagulation Track section of this encounter.  Patient instructed to take today's dose.  Patient verbalized understanding of these instructions.       FOLLOW-UP Return in 3 weeks (on 05/19/2015) for Follow up INR at 0915h.  [name], III Pharm.D., CACP

## 2015-04-29 NOTE — Progress Notes (Signed)
INTERNAL MEDICINE TEACHING ATTENDING ADDENDUM - Aldine Contes M.D  Duration- lifelong, Indication- DVT, Factor V Leiden, INR- therapeutic. Agree with pharmacy recommendations as outlined in their note.

## 2015-05-05 ENCOUNTER — Other Ambulatory Visit (HOSPITAL_COMMUNITY)
Admission: RE | Admit: 2015-05-05 | Discharge: 2015-05-05 | Disposition: A | Discharge status: Home / Self Care | Payer: Medicare Other | Source: Ambulatory Visit | Attending: Gynecology | Admitting: Gynecology

## 2015-05-05 ENCOUNTER — Encounter: Payer: Self-pay | Admitting: Gynecology

## 2015-05-05 ENCOUNTER — Ambulatory Visit (INDEPENDENT_AMBULATORY_CARE_PROVIDER_SITE_OTHER): Payer: Medicare Other | Admitting: Gynecology

## 2015-05-05 VITALS — BP 122/76 | Ht 64.0 in | Wt 224.0 lb

## 2015-05-05 DIAGNOSIS — Z01419 Encounter for gynecological examination (general) (routine) without abnormal findings: Secondary | ICD-10-CM | POA: Diagnosis not present

## 2015-05-05 DIAGNOSIS — Z1272 Encounter for screening for malignant neoplasm of vagina: Secondary | ICD-10-CM | POA: Diagnosis not present

## 2015-05-05 DIAGNOSIS — M810 Age-related osteoporosis without current pathological fracture: Secondary | ICD-10-CM

## 2015-05-05 DIAGNOSIS — Z01411 Encounter for gynecological examination (general) (routine) with abnormal findings: Secondary | ICD-10-CM | POA: Diagnosis not present

## 2015-05-05 DIAGNOSIS — C55 Malignant neoplasm of uterus, part unspecified: Secondary | ICD-10-CM

## 2015-05-05 DIAGNOSIS — N952 Postmenopausal atrophic vaginitis: Secondary | ICD-10-CM | POA: Diagnosis not present

## 2015-05-05 NOTE — Patient Instructions (Addendum)
Call to Schedule your mammogram  Facilities in [place]: 1)  [facility], [place] Phone: [phone])  [facility]. [address] [place] Phone: [phone] 3)  Dr. Isaiah Blakes at [facility] [address] Phone: [phone]     Mammogram A mammogram is an X-ray test to find changes in a woman's breast. You should get a mammogram if:  You are 20 years of age or older  You have risk factors.   Your doctor recommends that you have one.  BEFORE THE TEST  Do not schedule the test the week before your period, especially if your breasts are sore during this time.  On the day of your mammogram:  Wash your breasts and armpits well. After washing, do not put on any deodorant or talcum powder on until after your test.   Eat and drink as you usually do.   Take your medicines as usual.   If you are diabetic and take insulin, make sure you:   Eat before coming for your test.   Take your insulin as usual.   If you cannot keep your appointment, call before the appointment to cancel. Schedule another appointment.  TEST  You will need to undress from the waist up. You will put on a hospital gown.   Your breast will be put on the mammogram machine, and it will press firmly on your breast with a piece of plastic called a compression paddle. This will make your breast flatter so that the machine can X-ray all parts of your breast.   Both breasts will be X-rayed. Each breast will be X-rayed from above and from the side. An X-ray might need to be taken again if the picture is not good enough.   The mammogram will last about 15 to 30 minutes.  AFTER THE TEST Finding out the results of your test Ask when your test results will be ready. Make sure you get your test results.  Document Released: 01/21/2009 Document Revised: 10/14/2011 Document Reviewed: 01/21/2009 [facility] Patient  Information 2012 [place].     Follow up in one year for annual exam.

## 2015-05-05 NOTE — Addendum Note (Signed)
Addended by: Nelva Nay on: 05/05/2015 11:07 AM   Modules accepted: Orders

## 2015-05-05 NOTE — Progress Notes (Signed)
Vedika Dumlao 06/02/1936 [id number]        79 y.o.  G2P2001 for Breast and pelvic exam.  Several issues noted below.  Past medical history,surgical history, problem list, medications, allergies, family history and social history were all reviewed and documented as reviewed in the EPIC chart.  ROS:  Performed with pertinent positives and negatives included in the history, assessment and plan.   Additional significant findings :  none   Exam: [name] Counsellor Vitals:   05/05/15 1000  BP: 122/76  Height: 5\' 4"  (1.626 m)  Weight: 224 lb (101.606 kg)   General appearance:  Normal affect, orientation and appearance. Skin: Grossly normal HEENT: Without gross lesions.  No cervical or supraclavicular adenopathy. Thyroid normal.  Lungs:  Clear without wheezing, rales or rhonchi Cardiac: RR, without RMG Abdominal:  Soft, nontender, without masses, guarding, rebound, organomegaly or hernia Breasts:  Examined lying and sitting without masses, retractions, discharge or axillary adenopathy. Pelvic:  Ext/BUS/vagina with atrophic changes. Shortened as noted previously. Pap of cuff done.  Bimanual without masses or tenderness  Anus and perineum  Normal   Rectovaginal  Normal sphincter tone without palpated masses or tenderness.    Assessment/Plan:  79 y.o. G32P2001 female for breast and pelvic exam.  1. 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 Subsequent trachelectomy and upper vaginectomy by Dr. Marti Sleigh 2006 for CIN-2. Was residual CIN-1 in the specimen. Pap smears have been negative since then. Pap smear done today. Exam NED. 2. Osteoporosis.  DEXA 06/2014 T score -2.6. Stable from prior DEXA off Fosamax. Had been on Fosamax 2006 through 2014. Plan repeat DEXA next year at two-year interval. Increased calcium and vitamin D reviewed. 3. Mammography way overdue. I again reminded her to schedule her mammography and encouraged her to do so. SBE  monthly reviewed. 4. Colonoscopy 2012. Repeat at their recommended interval. 5. Health maintenance. No routine lab work done as she has this done at her primary 89 office who is following her for her medical issues. Follow up for mammogram otherwise annual exam in one year.   Anastasio Auerbach MD, 10:14 AM 05/05/2015

## 2015-05-06 LAB — CYTOLOGY - PAP

## 2015-05-09 ENCOUNTER — Other Ambulatory Visit: Payer: Self-pay | Admitting: Internal Medicine

## 2015-05-19 ENCOUNTER — Ambulatory Visit (INDEPENDENT_AMBULATORY_CARE_PROVIDER_SITE_OTHER): Payer: Medicare Other | Admitting: Pharmacist

## 2015-05-19 DIAGNOSIS — D6851 Activated protein C resistance: Secondary | ICD-10-CM

## 2015-05-19 DIAGNOSIS — Z7901 Long term (current) use of anticoagulants: Secondary | ICD-10-CM | POA: Diagnosis present

## 2015-05-19 LAB — POCT INR: INR: 3.7

## 2015-05-19 NOTE — Patient Instructions (Signed)
Patient instructed to take medications as defined in the Anti-coagulation Track section of this encounter.  Patient instructed to take today's dose.  Patient verbalized understanding of these instructions.    

## 2015-05-19 NOTE — Progress Notes (Signed)
Anti-Coagulation Progress Note  Judith [name] is a 79 y.o. female who is currently on an anti-coagulation regimen.    RECENT RESULTS: Recent results are below, the most recent result is correlated with a dose of 20 mg. per week: Lab Results  Component Value Date   INR 3.70 05/19/2015   INR 2.90 04/28/2015   INR 2.00 04/08/2015    ANTI-COAG DOSE: Anticoagulation Dose Instructions as of 05/19/2015      [name] Tue Wed Thu Fri Sat   New Dose 2.5 mg 2.5 mg 2.5 mg 2.5 mg 2.5 mg 2.5 mg 2.5 mg       ANTICOAG SUMMARY: Anticoagulation Episode Summary    Current INR goal 2.0-3.0  Next INR check 06/09/2015  INR from last check 3.70! (05/19/2015)  Weekly max dose   Target end date Indefinite  [facility]  Preferred lab   Send INR reminders to ANTICOAG IMP   Indications  FACTOR V DEFICIENCY [D68.2] Long term current use of anticoagulant [Z79.01]        Comments Index VTE (DVT) was 2007. She was subsequently documented as having Leiden Factor V Deficiency. She has indicated a continued interest in remaining upon warfarin. Should re-visit this issue with patient annually. Currently "with the advice and consent of the patient--after review of risks vs. benefits--she elects to stay upon warfarin.      Anticoagulation Care Providers    Provider Role Specialty Phone number   Burman Freestone, MD  Internal Medicine [phone]      ANTICOAG TODAY: Anticoagulation Summary as of 05/19/2015    INR goal 2.0-3.0  Selected INR 3.70! (05/19/2015)  Next INR check 06/09/2015  Target end date Indefinite   Indications  FACTOR V DEFICIENCY [D68.2] Long term current use of anticoagulant [Z79.01]      Anticoagulation Episode Summary    [facility]   Preferred lab    Send INR reminders to ANTICOAG IMP   Comments Index VTE (DVT) was 2007. She was subsequently documented as having Leiden Factor V Deficiency. She has indicated a continued interest in  remaining upon warfarin. Should re-visit this issue with patient annually. Currently "with the advice and consent of the patient--after review of risks vs. benefits--she elects to stay upon warfarin.    Anticoagulation Care Providers    Provider Role Specialty Phone number   Burman Freestone, MD  Internal Medicine [phone]      PATIENT INSTRUCTIONS: Patient Instructions  Patient instructed to take medications as defined in the Anti-coagulation Track section of this encounter.  Patient instructed to take today's dose.  Patient verbalized understanding of these instructions.       FOLLOW-UP Return in 3 weeks (on 06/09/2015) for Follow up INR at 0900h.  [name], III Pharm.D., CACP

## 2015-05-20 NOTE — Progress Notes (Signed)
I have reviewed Dr. Gladstone Pih note.  Patient on coumadin for apparent factor V leiden deficiency.  INR elevated, coumadin decreased.

## 2015-05-30 ENCOUNTER — Other Ambulatory Visit: Payer: Self-pay | Admitting: Internal Medicine

## 2015-06-09 ENCOUNTER — Ambulatory Visit (INDEPENDENT_AMBULATORY_CARE_PROVIDER_SITE_OTHER): Payer: Medicare Other | Admitting: Pharmacist

## 2015-06-09 DIAGNOSIS — D682 Hereditary deficiency of other clotting factors: Secondary | ICD-10-CM

## 2015-06-09 DIAGNOSIS — Z7901 Long term (current) use of anticoagulants: Secondary | ICD-10-CM | POA: Diagnosis present

## 2015-06-09 LAB — POCT INR: INR: 2.6

## 2015-06-09 NOTE — Patient Instructions (Signed)
Patient instructed to take medications as defined in the Anti-coagulation Track section of this encounter.  Patient instructed to take today's dose.  Patient verbalized understanding of these instructions.    

## 2015-06-09 NOTE — Progress Notes (Signed)
Anti-Coagulation Progress Note  Judith [name] is a 79 y.o. female who is currently on an anti-coagulation regimen.    RECENT RESULTS: Recent results are below, the most recent result is correlated with a dose of 17.5 mg. per week: Lab Results  Component Value Date   INR 2.60 06/09/2015   INR 3.70 05/19/2015   INR 2.90 04/28/2015    ANTI-COAG DOSE: Anticoagulation Dose Instructions as of 06/09/2015      [name] Tue Wed Thu Fri Sat   New Dose 2.5 mg 2.5 mg 2.5 mg 2.5 mg 2.5 mg 2.5 mg 2.5 mg       ANTICOAG SUMMARY: Anticoagulation Episode Summary    Current INR goal 2.0-3.0  Next INR check 07/07/2015  INR from last check 2.60 (06/09/2015)  Weekly max dose   Target end date Indefinite  [facility]  Preferred lab   Send INR reminders to ANTICOAG IMP   Indications  FACTOR V DEFICIENCY [D68.2] Long term current use of anticoagulant [Z79.01]        Comments Index VTE (DVT) was 2007. She was subsequently documented as having Leiden Factor V Deficiency. She has indicated a continued interest in remaining upon warfarin. Should re-visit this issue with patient annually. Currently "with the advice and consent of the patient--after review of risks vs. benefits--she elects to stay upon warfarin.      Anticoagulation Care Providers    Provider Role Specialty Phone number   Burman Freestone, MD  Internal Medicine [phone]      ANTICOAG TODAY: Anticoagulation Summary as of 06/09/2015    INR goal 2.0-3.0  Selected INR 2.60 (06/09/2015)  Next INR check 07/07/2015  Target end date Indefinite   Indications  FACTOR V DEFICIENCY [D68.2] Long term current use of anticoagulant [Z79.01]      Anticoagulation Episode Summary    [facility]   Preferred lab    Send INR reminders to ANTICOAG IMP   Comments Index VTE (DVT) was 2007. She was subsequently documented as having Leiden Factor V Deficiency. She has indicated a continued interest in  remaining upon warfarin. Should re-visit this issue with patient annually. Currently "with the advice and consent of the patient--after review of risks vs. benefits--she elects to stay upon warfarin.    Anticoagulation Care Providers    Provider Role Specialty Phone number   Burman Freestone, MD  Internal Medicine [phone]      PATIENT INSTRUCTIONS: Patient Instructions  Patient instructed to take medications as defined in the Anti-coagulation Track section of this encounter.  Patient instructed to take today's dose.  Patient verbalized understanding of these instructions.       FOLLOW-UP Return in 4 weeks (on 07/07/2015) for Follow up INR at 0900h.  [name], III Pharm.D., CACP

## 2015-06-17 NOTE — Progress Notes (Signed)
Have reviewed Dr. Gladstone Pih documentation.  Apparent factor V deficiency.  INR at goal.

## 2015-06-21 ENCOUNTER — Other Ambulatory Visit: Payer: Self-pay | Admitting: Internal Medicine

## 2015-06-21 DIAGNOSIS — I10 Essential (primary) hypertension: Secondary | ICD-10-CM

## 2015-06-23 NOTE — Telephone Encounter (Signed)
Patient has a coumadin clinic visit on 07/07/15. Please call the patient so she gets ordered labs (BMP) done the same day. Thanks.

## 2015-06-27 ENCOUNTER — Other Ambulatory Visit: Payer: Self-pay | Admitting: Internal Medicine

## 2015-07-07 ENCOUNTER — Ambulatory Visit (INDEPENDENT_AMBULATORY_CARE_PROVIDER_SITE_OTHER): Payer: Medicare Other | Admitting: Pharmacist

## 2015-07-07 DIAGNOSIS — Z7901 Long term (current) use of anticoagulants: Secondary | ICD-10-CM

## 2015-07-07 DIAGNOSIS — D682 Hereditary deficiency of other clotting factors: Secondary | ICD-10-CM

## 2015-07-07 LAB — POCT INR: INR: 3

## 2015-07-07 NOTE — Progress Notes (Signed)
Anti-Coagulation Progress Note  Judith [name] is a 79 y.o. female who is currently on an anti-coagulation regimen.    RECENT RESULTS: Recent results are below, the most recent result is correlated with a dose of 17.5 mg. per week: Lab Results  Component Value Date   INR 3.00 07/07/2015   INR 2.60 06/09/2015   INR 3.70 05/19/2015    ANTI-COAG DOSE: Anticoagulation Dose Instructions as of 07/07/2015      [name] Tue Wed Thu Fri Sat   New Dose 2.5 mg 0 mg 2.5 mg 2.5 mg 2.5 mg 2.5 mg 2.5 mg       ANTICOAG SUMMARY: Anticoagulation Episode Summary    Current INR goal 2.0-3.0  Next INR check 07/28/2015  INR from last check 3.00 (07/07/2015)  Weekly max dose   Target end date Indefinite  [facility]  Preferred lab   Send INR reminders to ANTICOAG IMP   Indications  FACTOR V DEFICIENCY [D68.2] Long term current use of anticoagulant [Z79.01]        Comments Index VTE (DVT) was 2007. She was subsequently documented as having Leiden Factor V Deficiency. She has indicated a continued interest in remaining upon warfarin. Should re-visit this issue with patient annually. Currently "with the advice and consent of the patient--after review of risks vs. benefits--she elects to stay upon warfarin.      Anticoagulation Care Providers    Provider Role Specialty Phone number   Burman Freestone, MD  Internal Medicine [phone]      ANTICOAG TODAY: Anticoagulation Summary as of 07/07/2015    INR goal 2.0-3.0  Selected INR 3.00 (07/07/2015)  Next INR check 07/28/2015  Target end date Indefinite   Indications  FACTOR V DEFICIENCY [D68.2] Long term current use of anticoagulant [Z79.01]      Anticoagulation Episode Summary    [facility]   Preferred lab    Send INR reminders to ANTICOAG [facility]   Comments Index VTE (DVT) was 2007. She was subsequently documented as having Leiden Factor V Deficiency. She has indicated a continued interest in  remaining upon warfarin. Should re-visit this issue with patient annually. Currently "with the advice and consent of the patient--after review of risks vs. benefits--she elects to stay upon warfarin.    Anticoagulation Care Providers    Provider Role Specialty Phone number   Burman Freestone, MD  Internal Medicine [phone]      PATIENT INSTRUCTIONS: Patient Instructions  Patient instructed to take medications as defined in the Anti-coagulation Track section of this encounter.  Patient instructed to OMIT today's dose and ALL doses on Monday's.   Patient verbalized understanding of these instructions.       FOLLOW-UP Return in 3 weeks (on 07/28/2015) for Follow up INR at 0915h.  [name], III Pharm.D., CACP

## 2015-07-07 NOTE — Patient Instructions (Signed)
Patient instructed to take medications as defined in the Anti-coagulation Track section of this encounter.  Patient instructed to OMIT today's dose and ALL doses on Monday's.   Patient verbalized understanding of these instructions.

## 2015-07-22 DIAGNOSIS — Z23 Encounter for immunization: Secondary | ICD-10-CM | POA: Diagnosis not present

## 2015-07-28 ENCOUNTER — Ambulatory Visit (INDEPENDENT_AMBULATORY_CARE_PROVIDER_SITE_OTHER): Payer: Medicare Other | Admitting: Pharmacist

## 2015-07-28 DIAGNOSIS — D682 Hereditary deficiency of other clotting factors: Secondary | ICD-10-CM | POA: Diagnosis not present

## 2015-07-28 DIAGNOSIS — Z7901 Long term (current) use of anticoagulants: Secondary | ICD-10-CM

## 2015-07-28 LAB — POCT INR: INR: 2.2

## 2015-07-28 NOTE — Patient Instructions (Signed)
Patient instructed to take medications as defined in the Anti-coagulation Track section of this encounter.  Patient instructed to take today's dose.  Patient verbalized understanding of these instructions.    

## 2015-07-28 NOTE — Progress Notes (Signed)
Anti-Coagulation Progress Note  Judith [name] is a 79 y.o. female who is currently on an anti-coagulation regimen.    RECENT RESULTS: Recent results are below, the most recent result is correlated with a dose of 15 mg. per week: Lab Results  Component Value Date   INR 2.2 07/28/2015   INR 3.00 07/07/2015   INR 2.60 06/09/2015    ANTI-COAG DOSE: Anticoagulation Dose Instructions as of 07/28/2015      [name] Tue Wed Thu Fri Sat   New Dose 2.5 mg 0 mg 2.5 mg 2.5 mg 2.5 mg 2.5 mg 2.5 mg       ANTICOAG SUMMARY: Anticoagulation Episode Summary    Current INR goal 2.0-3.0  Next INR check 08/25/2015  INR from last check 2.2 (07/28/2015)  Weekly max dose   Target end date Indefinite  [facility]  Preferred lab   Send INR reminders to [facility]   Indications  FACTOR V DEFICIENCY [D68.2] Long term current use of anticoagulant [Z79.01]        Comments Index VTE (DVT) was 2007. She was subsequently documented as having Leiden Factor V Deficiency. She has indicated a continued interest in remaining upon warfarin. Should re-visit this issue with patient annually. Currently "with the advice and consent of the patient--after review of risks vs. benefits--she elects to stay upon warfarin.      Anticoagulation Care Providers    Provider Role Specialty Phone number   Burman Freestone, MD  Internal Medicine [phone]      ANTICOAG TODAY: Anticoagulation Summary as of 07/28/2015    INR goal 2.0-3.0  Selected INR 2.2 (07/28/2015)  Next INR check 08/25/2015  Target end date Indefinite   Indications  FACTOR V DEFICIENCY [D68.2] Long term current use of anticoagulant [Z79.01]      Anticoagulation Episode Summary    [facility]   Preferred lab    Send INR reminders to [facility]   Comments Index VTE (DVT) was 2007. She was subsequently documented as having Leiden Factor V Deficiency. She has indicated a continued interest in  remaining upon warfarin. Should re-visit this issue with patient annually. Currently "with the advice and consent of the patient--after review of risks vs. benefits--she elects to stay upon warfarin.    Anticoagulation Care Providers    Provider Role Specialty Phone number   Burman Freestone, MD  Internal Medicine [phone]      PATIENT INSTRUCTIONS: Patient Instructions  Patient instructed to take medications as defined in the Anti-coagulation Track section of this encounter.  Patient instructed to take today's dose.  Patient verbalized understanding of these instructions.       FOLLOW-UP Return in 4 weeks (on 08/25/2015) for Follow up INR at 0915h.  [name], III Pharm.D., CACP

## 2015-08-22 DIAGNOSIS — Z961 Presence of intraocular lens: Secondary | ICD-10-CM | POA: Diagnosis not present

## 2015-08-25 ENCOUNTER — Ambulatory Visit (INDEPENDENT_AMBULATORY_CARE_PROVIDER_SITE_OTHER): Payer: Medicare Other | Admitting: Pharmacist

## 2015-08-25 DIAGNOSIS — Z7901 Long term (current) use of anticoagulants: Secondary | ICD-10-CM

## 2015-08-25 DIAGNOSIS — D682 Hereditary deficiency of other clotting factors: Secondary | ICD-10-CM

## 2015-08-25 LAB — POCT INR: INR: 2.8

## 2015-08-25 NOTE — Patient Instructions (Signed)
Patient instructed to take medications as defined in the Anti-coagulation Track section of this encounter.  Patient instructed to OMIT today's dose and every Monday's dose until instructed otherwise.  Patient verbalized understanding of these instructions.

## 2015-08-25 NOTE — Progress Notes (Signed)
INTERNAL MEDICINE TEACHING ATTENDING ADDENDUM - Aldine Contes M.D  Duration- lifelong, Indication- PE/DVT, Factor V Leiden, INR- therapeutic. Agree with pharmacy recommendations as outlined in their note.

## 2015-08-25 NOTE — Progress Notes (Signed)
Anti-Coagulation Progress Note  Judith [name] is a 79 y.o. female who is currently on an anti-coagulation regimen.    RECENT RESULTS: Recent results are below, the most recent result is correlated with a dose of 15 mg. per week: Lab Results  Component Value Date   INR 2.80 08/25/2015   INR 2.2 07/28/2015   INR 3.00 07/07/2015    ANTI-COAG DOSE: Anticoagulation Dose Instructions as of 08/25/2015      Sun Mon Tue Wed Thu Fri Sat   New Dose 2.5 mg 0 mg 2.5 mg 2.5 mg 2.5 mg 2.5 mg 2.5 mg       ANTICOAG SUMMARY: Anticoagulation Episode Summary    Current INR goal 2.0-3.0  Next INR check 09/22/2015  INR from last check 2.80 (08/25/2015)  Weekly max dose   Target end date Indefinite  [facility]  Preferred lab   Send INR reminders to [facility]   Indications  FACTOR V DEFICIENCY [D68.2] Long term current use of anticoagulant [Z79.01]        Comments Index VTE (DVT) was 2007. She was subsequently documented as having Leiden Factor V Deficiency. She has indicated a continued interest in remaining upon warfarin. Should re-visit this issue with patient annually. Currently "with the advice and consent of the patient--after review of risks vs. benefits--she elects to stay upon warfarin.      Anticoagulation Care Providers    Provider Role Specialty Phone number   Burman Freestone, MD  Internal Medicine [phone]      ANTICOAG TODAY: Anticoagulation Summary as of 08/25/2015    INR goal 2.0-3.0  Selected INR 2.80 (08/25/2015)  Next INR check 09/22/2015  Target end date Indefinite   Indications  FACTOR V DEFICIENCY [D68.2] Long term current use of anticoagulant [Z79.01]      Anticoagulation Episode Summary    [facility]   Preferred lab    Send INR reminders to [facility]   Comments Index VTE (DVT) was 2007. She was subsequently documented as having Leiden Factor V Deficiency. She has indicated a continued interest in  remaining upon warfarin. Should re-visit this issue with patient annually. Currently "with the advice and consent of the patient--after review of risks vs. benefits--she elects to stay upon warfarin.    Anticoagulation Care Providers    Provider Role Specialty Phone number   Burman Freestone, MD  Internal Medicine [phone]      PATIENT INSTRUCTIONS: Patient Instructions  Patient instructed to take medications as defined in the Anti-coagulation Track section of this encounter.  Patient instructed to OMIT today's dose and every Monday's dose until instructed otherwise.  Patient verbalized understanding of these instructions.       FOLLOW-UP Return in 4 weeks (on 09/22/2015) for Follow up INR at 0900h.  [name], III Pharm.D., CACP

## 2015-09-22 ENCOUNTER — Ambulatory Visit (INDEPENDENT_AMBULATORY_CARE_PROVIDER_SITE_OTHER): Payer: Medicare Other | Admitting: Pharmacist

## 2015-09-22 DIAGNOSIS — D682 Hereditary deficiency of other clotting factors: Secondary | ICD-10-CM | POA: Diagnosis not present

## 2015-09-22 DIAGNOSIS — Z7901 Long term (current) use of anticoagulants: Secondary | ICD-10-CM

## 2015-09-22 LAB — POCT INR: INR: 2.2

## 2015-09-22 NOTE — Progress Notes (Signed)
Anti-Coagulation Progress Note  Judith [name] is a 79 y.o. female who is currently on an anti-coagulation regimen.    RECENT RESULTS: Recent results are below, the most recent result is correlated with a dose of 15 mg. per week: Lab Results  Component Value Date   INR 2.20 09/22/2015   INR 2.80 08/25/2015   INR 2.2 07/28/2015    ANTI-COAG DOSE: Anticoagulation Dose Instructions as of 09/22/2015      [name] Tue Wed Thu Fri Sat   New Dose 2.5 mg 2.5 mg 2.5 mg 2.5 mg 2.5 mg 2.5 mg 2.5 mg       ANTICOAG SUMMARY: Anticoagulation Episode Summary    Current INR goal 2.0-3.0  Next INR check 10/20/2015  INR from last check 2.20 (09/22/2015)  Weekly max dose   Target end date Indefinite  [facility]  Preferred lab   Send INR reminders to [facility]   Indications  FACTOR V DEFICIENCY [D68.2] Long term current use of anticoagulant [Z79.01]        Comments Index VTE (DVT) was 2007. She was subsequently documented as having Leiden Factor V Deficiency. She has indicated a continued interest in remaining upon warfarin. Should re-visit this issue with patient annually. Currently "with the advice and consent of the patient--after review of risks vs. benefits--she elects to stay upon warfarin.      Anticoagulation Care Providers    Provider Role Specialty Phone number   Burman Freestone, MD  Internal Medicine [phone]      ANTICOAG TODAY: Anticoagulation Summary as of 09/22/2015    INR goal 2.0-3.0  Selected INR 2.20 (09/22/2015)  Next INR check 10/20/2015  Target end date Indefinite   Indications  FACTOR V DEFICIENCY [D68.2] Long term current use of anticoagulant [Z79.01]      Anticoagulation Episode Summary    [facility]   Preferred lab    Send INR reminders to [facility]   Comments Index VTE (DVT) was 2007. She was subsequently documented as having Leiden Factor V Deficiency. She has indicated a continued interest in  remaining upon warfarin. Should re-visit this issue with patient annually. Currently "with the advice and consent of the patient--after review of risks vs. benefits--she elects to stay upon warfarin.    Anticoagulation Care Providers    Provider Role Specialty Phone number   Burman Freestone, MD  Internal Medicine [phone]      PATIENT INSTRUCTIONS: Patient Instructions  Patient instructed to take medications as defined in the Anti-coagulation Track section of this encounter.  Patient instructed to take today's dose.  Patient verbalized understanding of these instructions.       FOLLOW-UP Return in 4 weeks (on 10/20/2015) for Follow up INR at 0900h.  [name], III Pharm.D., CACP

## 2015-09-22 NOTE — Patient Instructions (Signed)
Patient instructed to take medications as defined in the Anti-coagulation Track section of this encounter.  Patient instructed to take today's dose.  Patient verbalized understanding of these instructions.    

## 2015-09-28 ENCOUNTER — Other Ambulatory Visit: Payer: Self-pay | Admitting: Internal Medicine

## 2015-09-28 ENCOUNTER — Other Ambulatory Visit: Payer: Self-pay | Admitting: Oncology

## 2015-10-01 ENCOUNTER — Other Ambulatory Visit: Payer: Self-pay | Admitting: Student in an Organized Health Care Education/Training Program

## 2015-10-01 NOTE — Telephone Encounter (Signed)
Melanie from [facility] requesting vesicare 10 mg to be filled.

## 2015-10-01 NOTE — Telephone Encounter (Signed)
Previously sent via surescripts, will task again

## 2015-10-20 ENCOUNTER — Ambulatory Visit: Payer: Medicare Other

## 2015-11-17 ENCOUNTER — Ambulatory Visit: Payer: Medicare Other

## 2015-11-28 ENCOUNTER — Other Ambulatory Visit: Payer: Self-pay | Admitting: Internal Medicine

## 2015-12-01 ENCOUNTER — Ambulatory Visit (INDEPENDENT_AMBULATORY_CARE_PROVIDER_SITE_OTHER): Payer: Medicare Other | Admitting: Pharmacist

## 2015-12-01 DIAGNOSIS — D682 Hereditary deficiency of other clotting factors: Secondary | ICD-10-CM | POA: Diagnosis not present

## 2015-12-01 DIAGNOSIS — Z7901 Long term (current) use of anticoagulants: Secondary | ICD-10-CM

## 2015-12-01 LAB — POCT INR: INR: 2.1

## 2015-12-01 NOTE — Progress Notes (Signed)
Anti-Coagulation Progress Note  Judith [name] is a 80 y.o. female who is currently on an anti-coagulation regimen.    RECENT RESULTS: Recent results are below, the most recent result is correlated with a dose of 17.5 mg. per week: Lab Results  Component Value Date   INR 2.10 12/01/2015   INR 2.20 09/22/2015   INR 2.80 08/25/2015    ANTI-COAG DOSE: Anticoagulation Dose Instructions as of 12/01/2015      [name] Tue Wed Thu Fri Sat   New Dose 2.5 mg 5 mg 2.5 mg 2.5 mg 2.5 mg 2.5 mg 2.5 mg       ANTICOAG SUMMARY: Anticoagulation Episode Summary    Current INR goal 2.0-3.0  Next INR check 12/29/2015  INR from last check 2.10 (12/01/2015)  Weekly max dose   Target end date Indefinite  [facility]  Preferred lab   Send INR reminders to [facility]   Indications  FACTOR V DEFICIENCY [D68.2] Long term current use of anticoagulant [Z79.01]        Comments Index VTE (DVT) was 2007. She was subsequently documented as having Leiden Factor V Deficiency. She has indicated a continued interest in remaining upon warfarin. Should re-visit this issue with patient annually. Currently "with the advice and consent of the patient--after review of risks vs. benefits--she elects to stay upon warfarin.      Anticoagulation Care Providers    Provider Role Specialty Phone number   Burman Freestone, MD  Internal Medicine [phone]      ANTICOAG TODAY: Anticoagulation Summary as of 12/01/2015    INR goal 2.0-3.0  Selected INR 2.10 (12/01/2015)  Next INR check 12/29/2015  Target end date Indefinite   Indications  FACTOR V DEFICIENCY [D68.2] Long term current use of anticoagulant [Z79.01]      Anticoagulation Episode Summary    [facility]   Preferred lab    Send INR reminders to [facility]   Comments Index VTE (DVT) was 2007. She was subsequently documented as having Leiden Factor V Deficiency. She has indicated a continued interest in  remaining upon warfarin. Should re-visit this issue with patient annually. Currently "with the advice and consent of the patient--after review of risks vs. benefits--she elects to stay upon warfarin.    Anticoagulation Care Providers    Provider Role Specialty Phone number   Burman Freestone, MD  Internal Medicine [phone]      PATIENT INSTRUCTIONS: Patient Instructions  Patient instructed to take medications as defined in the Anti-coagulation Track section of this encounter.  Patient instructed to take today's dose.  Patient verbalized understanding of these instructions.       FOLLOW-UP Return in 4 weeks (on 12/29/2015) for Follow up INR at 0915h.  [name], III Pharm.D., CACP

## 2015-12-01 NOTE — Patient Instructions (Signed)
Patient instructed to take medications as defined in the Anti-coagulation Track section of this encounter.  Patient instructed to take today's dose.  Patient verbalized understanding of these instructions.    

## 2015-12-01 NOTE — Telephone Encounter (Signed)
Last visit April 2016

## 2015-12-02 ENCOUNTER — Other Ambulatory Visit: Payer: Self-pay | Admitting: Internal Medicine

## 2015-12-02 ENCOUNTER — Other Ambulatory Visit: Payer: Self-pay | Admitting: Student in an Organized Health Care Education/Training Program

## 2015-12-03 ENCOUNTER — Other Ambulatory Visit: Payer: Self-pay | Admitting: Internal Medicine

## 2015-12-07 ENCOUNTER — Other Ambulatory Visit: Payer: Self-pay | Admitting: Internal Medicine

## 2015-12-14 ENCOUNTER — Other Ambulatory Visit: Payer: Self-pay | Admitting: Internal Medicine

## 2015-12-15 NOTE — Telephone Encounter (Signed)
Will try to get pt in to be seen ASAP

## 2015-12-29 ENCOUNTER — Other Ambulatory Visit: Payer: Self-pay | Admitting: Student in an Organized Health Care Education/Training Program

## 2015-12-29 ENCOUNTER — Ambulatory Visit (INDEPENDENT_AMBULATORY_CARE_PROVIDER_SITE_OTHER): Payer: Medicare Other | Admitting: Pharmacist

## 2015-12-29 ENCOUNTER — Ambulatory Visit (INDEPENDENT_AMBULATORY_CARE_PROVIDER_SITE_OTHER): Payer: Medicare Other | Admitting: Internal Medicine

## 2015-12-29 ENCOUNTER — Encounter: Payer: Self-pay | Admitting: Internal Medicine

## 2015-12-29 VITALS — BP 121/56 | HR 75 | Temp 97.5°F | Ht 64.0 in | Wt 219.5 lb

## 2015-12-29 DIAGNOSIS — Z23 Encounter for immunization: Secondary | ICD-10-CM

## 2015-12-29 DIAGNOSIS — D682 Hereditary deficiency of other clotting factors: Secondary | ICD-10-CM | POA: Diagnosis not present

## 2015-12-29 DIAGNOSIS — I10 Essential (primary) hypertension: Secondary | ICD-10-CM | POA: Diagnosis not present

## 2015-12-29 DIAGNOSIS — Z0000 Encounter for general adult medical examination without abnormal findings: Secondary | ICD-10-CM | POA: Diagnosis not present

## 2015-12-29 DIAGNOSIS — Z7901 Long term (current) use of anticoagulants: Secondary | ICD-10-CM

## 2015-12-29 LAB — POCT INR: INR: 3

## 2015-12-29 NOTE — Patient Instructions (Signed)
Patient instructed to take medications as defined in the Anti-coagulation Track section of this encounter.  Patient instructed to take today's dose.  Patient verbalized understanding of these instructions.    

## 2015-12-29 NOTE — Patient Instructions (Signed)
1. Please return for follow up for 3 months.   You are scheduled for INR re-check on 01/26/2016.  2. Please take all medications as previously prescribed.  Try and take a walk 5 days per week.   3. If you have worsening of your symptoms or new symptoms arise, please call the clinic [phone] or go to the ER immediately if symptoms are severe.  You have done a great job in taking all your medications. Please continue to do this.

## 2015-12-29 NOTE — Progress Notes (Signed)
Subjective:   Patient ID: Judith [name] female   DOB: May 24, 1936 80 y.o.   MRN: [id number]  HPI: [name] is a 80 y.o. female w/ PMHx of HTN, HLD, h/o PE on Coumadin, Osteoporosis, and stress incontinence, presents to the clinic today for a follow-up visit regarding her HTN. Today the patient has no complaints, feels well. Denies any recent SOB, chest pain, or discomfort. She has been taking her medications as previously prescribed. Has mild cognitive impairment but is very pleasant. Lives with her husband at home. Weight is stable.   Past Medical History  Diagnosis Date  . Hypertension   . Hyperlipidemia   . Pulmonary embolus ([place]) 2007    and DVT, on chronic anti-coagulation  . Shortness of breath 2011    Thought to be secondary to weight and deconditioning, negative cardiac and pulmonary evaluation: Lexiscan ETT, Echo  (EF nl., +LVH),-CXR, nl PFT's  . Osteoporosis 06/2014    T score -2.6 left forearm stable from prior DEXA off Fosamax  . Stress incontinence, female   . Chronic sinusitis   . DVT (deep vein thrombosis) in pregnancy     Both lower extremities  . Acid reflux   . Heterozygous factor V Leiden mutation ([place])   . Memory difficulty   . Cancer ([place]) 1983, 2006    , s/p XRT  . Adenocarcinoma of uterus ([place]) 83   Current Outpatient Prescriptions  Medication Sig Dispense Refill  . alendronate (FOSAMAX) 70 MG tablet TAKE 1 TABLET BY MOUTH WEEKLY ON SUNDAYS 12 tablet 3  . amLODipine (NORVASC) 10 MG tablet TAKE 1 TABLET BY MOUTH DAILY 31 tablet 5  . aspirin 81 MG tablet Take 81 mg by mouth daily.    . Cholecalciferol (VITAMIN D-3) 1000 UNITS CAPS Take by mouth.    Marland Kitchen lisinopril-hydrochlorothiazide (PRINZIDE,ZESTORETIC) 10-12.5 MG tablet TAKE 1 TABLET BY MOUTH DAILY. 30 tablet 2  . Multiple Vitamins-Minerals (CENTRUM) tablet Take 1 tablet by mouth daily.     . pravastatin (PRAVACHOL) 20 MG tablet TAKE 1 TABLET (20 MG TOTAL) BY MOUTH DAILY. 90 tablet 3  . VESICARE 10  MG tablet TAKE 1 TABLET BY MOUTH EVERY DAY 30 tablet 1  . warfarin (COUMADIN) 5 MG tablet TAKE AS DIRECTED BY ANTICOAGULATION CLINIC PROVIDED 31 tablet 7   No current facility-administered medications for this visit.    Review of Systems: General: Denies fever, chills, diaphoresis, appetite change and fatigue.  Respiratory: Denies SOB, DOE, cough, and wheezing.   Cardiovascular: Denies chest pain and palpitations.  Gastrointestinal: Denies nausea, vomiting, abdominal pain, and diarrhea.  Genitourinary: Denies dysuria, increased frequency, and flank pain. Endocrine: Denies hot or cold intolerance, polyuria, and polydipsia. Musculoskeletal: Denies myalgias, back pain, joint swelling, arthralgias and gait problem.  Skin: Denies pallor, rash and wounds.  Neurological: Denies dizziness, seizures, syncope, weakness, lightheadedness, numbness and headaches.  Psychiatric/Behavioral: Denies mood changes, and sleep disturbances.  Objective:   Physical Exam: Filed Vitals:   12/29/15 1000 12/29/15 1005  BP: 147/87 121/56  Pulse: 75 75  Temp: 97.5 F (36.4 C)   TempSrc: Oral   Height: 5\' 4"  (1.626 m)   Weight: 219 lb 8 oz (99.565 kg)   SpO2: 99%     General: Elderly female, alert, cooperative, NAD. HEENT: PERRL, EOMI. Moist mucus membranes Neck: Full range of motion without pain, supple, no lymphadenopathy or carotid bruits Lungs: Clear to ascultation bilaterally, normal work of respiration, no wheezes, rales, rhonchi Heart: RRR, no murmurs, gallops, or rubs Abdomen:  Soft, non-tender, non-distended, BS + Extremities: No cyanosis, clubbing, or edema Neurologic: Alert & oriented X3, cranial nerves II-XII intact, strength grossly intact, sensation intact to light touch   Assessment & Plan:   Please see problem based assessment and plan.

## 2015-12-29 NOTE — Progress Notes (Signed)
Anti-Coagulation Progress Note  Judith [name] is a 80 y.o. female who is currently on an anti-coagulation regimen.    RECENT RESULTS: Recent results are below, the most recent result is correlated with a dose of 20 mg. per week: Lab Results  Component Value Date   INR 3.00 12/29/2015   INR 2.10 12/01/2015   INR 2.20 09/22/2015    ANTI-COAG DOSE: Anticoagulation Dose Instructions as of 12/29/2015      Sun Mon Tue Wed Thu Fri Sat   New Dose 2.5 mg 2.5 mg 2.5 mg 2.5 mg 2.5 mg 2.5 mg 2.5 mg       ANTICOAG SUMMARY: Anticoagulation Episode Summary    Current INR goal 2.0-3.0  Next INR check 01/26/2016  INR from last check 3.00 (12/29/2015)  Weekly max dose   Target end date Indefinite  [facility]  Preferred lab   Send INR reminders to ANT[facility]AG [facility]   Indications  FACTOR V DEFICIENCY [D68.2] Long term current use of anticoagulant [Z79.01]        Comments Index VTE (DVT) was 2007. She was subsequently documented as having Leiden Factor V Deficiency. She has indicated a continued interest in remaining upon warfarin. Should re-visit this issue with patient annually. Currently "with the advice and consent of the patient--after review of risks vs. benefits--she elects to stay upon warfarin.      Anticoagulation Care Providers    Provider Role Specialty Phone number   Burman Freestone, MD  Internal Medicine [phone]      ANTICOAG TODAY: Anticoagulation Summary as of 12/29/2015    INR goal 2.0-3.0  Selected INR 3.00 (12/29/2015)  Next INR check 01/26/2016  Target end date Indefinite   Indications  FACTOR V DEFICIENCY [D68.2] Long term current use of anticoagulant [Z79.01]      Anticoagulation Episode Summary    [facility]   Preferred lab    Send INR reminders to ANTICOAG [facility]   Comments Index VTE (DVT) was 2007. She was subsequently documented as having Leiden Factor V Deficiency. She has indicated a continued interest in  remaining upon warfarin. Should re-visit this issue with patient annually. Currently "with the advice and consent of the patient--after review of risks vs. benefits--she elects to stay upon warfarin.    Anticoagulation Care Providers    Provider Role Specialty Phone number   Burman Freestone, MD  Internal Medicine [phone]      PATIENT INSTRUCTIONS: Patient Instructions  Patient instructed to take medications as defined in the Anti-coagulation Track section of this encounter.  Patient instructed to take today's dose.  Patient verbalized understanding of these instructions.       FOLLOW-UP Return in 4 weeks (on 01/26/2016) for Follow up INR at 0930h.  [name], III Pharm.D., CACP

## 2015-12-30 NOTE — Assessment & Plan Note (Signed)
Given flu shot  today 

## 2015-12-30 NOTE — Progress Notes (Signed)
Internal Medicine Clinic Attending  Case discussed with Dr. Jones at the time of the visit.  We reviewed the resident's history and exam and pertinent patient test results.  I agree with the assessment, diagnosis, and plan of care documented in the resident's note.  

## 2015-12-30 NOTE — Assessment & Plan Note (Signed)
BP Readings from Last 3 Encounters:  12/29/15 121/56  05/05/15 122/76  02/13/15 148/66    Lab Results  Component Value Date   NA 140 02/13/2015   K 4.8 02/13/2015   CREATININE 0.80 02/13/2015    Assessment: Blood pressure control:  At goal Comments: Taking Lisinopril-HCTZ 10-12.5 daily + Norvasc 10 mg daily.   Plan: Medications:  continue current medications Other plans: RTC in 3 months. Check renal function at that time.

## 2016-01-01 NOTE — Progress Notes (Signed)
I have reviewed Dr. Gladstone Pih note.  Patient is on [facility] for factor V leiden deficiency and preference to stay on warfarin.  INR 3 and coumadin decreased.

## 2016-01-26 ENCOUNTER — Ambulatory Visit (INDEPENDENT_AMBULATORY_CARE_PROVIDER_SITE_OTHER): Payer: Medicare Other | Admitting: Pharmacist

## 2016-01-26 ENCOUNTER — Other Ambulatory Visit: Payer: Self-pay | Admitting: Student in an Organized Health Care Education/Training Program

## 2016-01-26 DIAGNOSIS — Z7901 Long term (current) use of anticoagulants: Secondary | ICD-10-CM

## 2016-01-26 DIAGNOSIS — D682 Hereditary deficiency of other clotting factors: Secondary | ICD-10-CM | POA: Diagnosis not present

## 2016-01-26 LAB — POCT INR: INR: 2.5

## 2016-01-26 NOTE — Patient Instructions (Signed)
Patient instructed to take medications as defined in the Anti-coagulation Track section of this encounter.  Patient instructed to OMIT doses on Mondays--take as shown in dose response instructions provided for all other days.  Patient verbalized understanding of these instructions.

## 2016-01-26 NOTE — Progress Notes (Signed)
Anti-Coagulation Progress Note  Judith [name] is a 80 y.o. female who is currently on an anti-coagulation regimen.    RECENT RESULTS: Recent results are below, the most recent result is correlated with a dose of 17.5 mg. per week: Lab Results  Component Value Date   INR 2.50 01/26/2016   INR 3.00 12/29/2015   INR 2.10 12/01/2015    ANTI-COAG DOSE: Anticoagulation Dose Instructions as of 01/26/2016      Sun Mon Tue Wed Thu Fri Sat   New Dose 2.5 mg 0 mg 2.5 mg 2.5 mg 2.5 mg 2.5 mg 2.5 mg       ANTICOAG SUMMARY: Anticoagulation Episode Summary    Current INR goal 2.0-3.0  Next INR check 02/16/2016  INR from last check 2.50 (01/26/2016)  Weekly max dose   Target end date Indefinite  [facility]  Preferred lab   Send INR reminders to ANTICOAG [facility]   Indications  FACTOR V DEFICIENCY [D68.2] Long term current use of anticoagulant [Z79.01]        Comments Index VTE (DVT) was 2007. She was subsequently documented as having Leiden Factor V Deficiency. She has indicated a continued interest in remaining upon warfarin. Should re-visit this issue with patient annually. Currently "with the advice and consent of the patient--after review of risks vs. benefits--she elects to stay upon warfarin.      Anticoagulation Care Providers    Provider Role Specialty Phone number   Burman Freestone, MD  Internal Medicine [phone]      ANTICOAG TODAY: Anticoagulation Summary as of 01/26/2016    INR goal 2.0-3.0  Selected INR 2.50 (01/26/2016)  Next INR check 02/16/2016  Target end date Indefinite   Indications  FACTOR V DEFICIENCY [D68.2] Long term current use of anticoagulant [Z79.01]      Anticoagulation Episode Summary    [facility]   Preferred lab    Send INR reminders to ANTICOAG [facility]   Comments Index VTE (DVT) was 2007. She was subsequently documented as having Leiden Factor V Deficiency. She has indicated a continued interest in  remaining upon warfarin. Should re-visit this issue with patient annually. Currently "with the advice and consent of the patient--after review of risks vs. benefits--she elects to stay upon warfarin.    Anticoagulation Care Providers    Provider Role Specialty Phone number   Burman Freestone, MD  Internal Medicine [phone]      PATIENT INSTRUCTIONS: Patient Instructions  Patient instructed to take medications as defined in the Anti-coagulation Track section of this encounter.  Patient instructed to OMIT doses on Mondays--take as shown in dose response instructions provided for all other days.  Patient verbalized understanding of these instructions.       FOLLOW-UP Return in about 3 weeks (around 02/16/2016) for Follow up INR at 0915h.  [name], III Pharm.D., CACP  f

## 2016-02-16 ENCOUNTER — Ambulatory Visit: Payer: Medicare Other

## 2016-03-08 ENCOUNTER — Ambulatory Visit (INDEPENDENT_AMBULATORY_CARE_PROVIDER_SITE_OTHER): Payer: Medicare Other | Admitting: Pharmacist

## 2016-03-08 DIAGNOSIS — D682 Hereditary deficiency of other clotting factors: Secondary | ICD-10-CM | POA: Diagnosis not present

## 2016-03-08 DIAGNOSIS — Z7901 Long term (current) use of anticoagulants: Secondary | ICD-10-CM | POA: Diagnosis not present

## 2016-03-08 LAB — POCT INR: INR: 2.3

## 2016-03-08 NOTE — Progress Notes (Signed)
Anti-Coagulation Progress Note  Judith [name] is a 80 y.o. female who is currently on an anti-coagulation regimen.    RECENT RESULTS: Recent results are below, the most recent result is correlated with a dose of 15 mg. per week: Lab Results  Component Value Date   INR 2.30 03/08/2016   INR 2.50 01/26/2016   INR 3.00 12/29/2015    ANTI-COAG DOSE: Anticoagulation Dose Instructions as of 03/08/2016      [name] Tue Wed Thu Fri Sat   New Dose 2.5 mg 2.5 mg 2.5 mg 2.5 mg 2.5 mg 2.5 mg 2.5 mg       ANTICOAG SUMMARY: Anticoagulation Episode Summary    Current INR goal 2.0-3.0  Next INR check 03/29/2016  INR from last check 2.30 (03/08/2016)  Weekly max dose   Target end date Indefinite  [facility]  Preferred lab   Send INR reminders to ANTICOAG IMP   Indications  FACTOR V DEFICIENCY [D68.2] Long term current use of anticoagulant [Z79.01]        Comments Index VTE (DVT) was 2007. She was subsequently documented as having Leiden Factor V Deficiency. She has indicated a continued interest in remaining upon warfarin. Should re-visit this issue with patient annually. Currently "with the advice and consent of the patient--after review of risks vs. benefits--she elects to stay upon warfarin.      Anticoagulation Care Providers    Provider Role Specialty Phone number   Burman Freestone, MD  Internal Medicine [phone]      ANTICOAG TODAY: Anticoagulation Summary as of 03/08/2016    INR goal 2.0-3.0  Selected INR 2.30 (03/08/2016)  Next INR check 03/29/2016  Target end date Indefinite   Indications  FACTOR V DEFICIENCY [D68.2] Long term current use of anticoagulant [Z79.01]      Anticoagulation Episode Summary    [facility]   Preferred lab    Send INR reminders to ANTICOAG IMP   Comments Index VTE (DVT) was 2007. She was subsequently documented as having Leiden Factor V Deficiency. She has indicated a continued interest in remaining  upon warfarin. Should re-visit this issue with patient annually. Currently "with the advice and consent of the patient--after review of risks vs. benefits--she elects to stay upon warfarin.    Anticoagulation Care Providers    Provider Role Specialty Phone number   Burman Freestone, MD  Internal Medicine [phone]      PATIENT INSTRUCTIONS: Patient Instructions  Patient instructed to take medications as defined in the Anti-coagulation Track section of this encounter.  Patient instructed to take today's dose.  Patient verbalized understanding of these instructions.       FOLLOW-UP Return in about 3 weeks (around 03/29/2016) for Follow up INR at 0900h.  [name], III Pharm.D., CACP

## 2016-03-08 NOTE — Patient Instructions (Signed)
Patient instructed to take medications as defined in the Anti-coagulation Track section of this encounter.  Patient instructed to take today's dose.  Patient verbalized understanding of these instructions.    

## 2016-03-08 NOTE — Progress Notes (Signed)
INTERNAL MEDICINE TEACHING ATTENDING ADDENDUM - Chick Cousins M.D  Duration- indefinite, Indication- DVT, Factor V Leiden, INR- therapeutic. Agree with pharmacy recommendations as outlined in their note.      

## 2016-03-24 ENCOUNTER — Other Ambulatory Visit: Payer: Self-pay | Admitting: Student in an Organized Health Care Education/Training Program

## 2016-03-25 ENCOUNTER — Other Ambulatory Visit: Payer: Self-pay | Admitting: Student in an Organized Health Care Education/Training Program

## 2016-03-29 ENCOUNTER — Ambulatory Visit (INDEPENDENT_AMBULATORY_CARE_PROVIDER_SITE_OTHER): Payer: Medicare Other | Admitting: Pharmacist

## 2016-03-29 DIAGNOSIS — Z7901 Long term (current) use of anticoagulants: Secondary | ICD-10-CM

## 2016-03-29 DIAGNOSIS — D682 Hereditary deficiency of other clotting factors: Secondary | ICD-10-CM | POA: Diagnosis not present

## 2016-03-29 LAB — POCT INR: INR: 2.5

## 2016-03-29 NOTE — Patient Instructions (Signed)
Patient instructed to take medications as defined in the Anti-coagulation Track section of this encounter.  Patient instructed to take today's dose.  Patient verbalized understanding of these instructions.    

## 2016-03-29 NOTE — Progress Notes (Signed)
Anti-Coagulation Progress Note  Judith [name] is a 80 y.o. female who is currently on an anti-coagulation regimen.    RECENT RESULTS: Recent results are below, the most recent result is correlated with a dose of 17.5 mg. per week: Lab Results  Component Value Date   INR 2.50 03/29/2016   INR 2.30 03/08/2016   INR 2.50 01/26/2016    ANTI-COAG DOSE: Anticoagulation Dose Instructions as of 03/29/2016      Sun Mon Tue Wed Thu Fri Sat   New Dose 2.5 mg 2.5 mg 2.5 mg 2.5 mg 2.5 mg 2.5 mg 2.5 mg       ANTICOAG SUMMARY: Anticoagulation Episode Summary    Current INR goal 2.0-3.0  Next INR check 04/26/2016  INR from last check 2.50 (03/29/2016)  Weekly max dose   Target end date Indefinite  [facility]  Preferred lab   Send INR reminders to ANTICOAG IMP   Indications  FACTOR V DEFICIENCY [D68.2] Long term current use of anticoagulant [Z79.01]        Comments Index VTE (DVT) was 2007. She was subsequently documented as having Leiden Factor V Deficiency. She has indicated a continued interest in remaining upon warfarin. Should re-visit this issue with patient annually. Currently "with the advice and consent of the patient--after review of risks vs. benefits--she elects to stay upon warfarin.      Anticoagulation Care Providers    Provider Role Specialty Phone number   Burman Freestone, MD  Internal Medicine [phone]      ANTICOAG TODAY: Anticoagulation Summary as of 03/29/2016    INR goal 2.0-3.0  Selected INR 2.50 (03/29/2016)  Next INR check 04/26/2016  Target end date Indefinite   Indications  FACTOR V DEFICIENCY [D68.2] Long term current use of anticoagulant [Z79.01]      Anticoagulation Episode Summary    [facility]   Preferred lab    Send INR reminders to ANTICOAG IMP   Comments Index VTE (DVT) was 2007. She was subsequently documented as having Leiden Factor V Deficiency. She has indicated a continued interest in  remaining upon warfarin. Should re-visit this issue with patient annually. Currently "with the advice and consent of the patient--after review of risks vs. benefits--she elects to stay upon warfarin.    Anticoagulation Care Providers    Provider Role Specialty Phone number   Burman Freestone, MD  Internal Medicine [phone]      PATIENT INSTRUCTIONS: Patient Instructions  Patient instructed to take medications as defined in the Anti-coagulation Track section of this encounter.  Patient instructed to take today's dose.  Patient verbalized understanding of these instructions.       FOLLOW-UP Return in 4 weeks (on 04/26/2016) for Follow up INR at 0930h.  [name], III Pharm.D., CACP

## 2016-03-29 NOTE — Progress Notes (Signed)
INTERNAL MEDICINE TEACHING ATTENDING ADDENDUM - Klyn Kroening M.D  Duration- indefinite, Indication- DVT, Factor V Leiden, INR- therapeutic. Agree with pharmacy recommendations as outlined in their note.      

## 2016-04-16 ENCOUNTER — Telehealth: Payer: Self-pay | Admitting: Student in an Organized Health Care Education/Training Program

## 2016-04-16 NOTE — Telephone Encounter (Signed)
APT. REMINDER CALL, LMTCB °

## 2016-04-19 ENCOUNTER — Ambulatory Visit: Payer: Medicare Other | Admitting: Student in an Organized Health Care Education/Training Program

## 2016-04-19 ENCOUNTER — Encounter: Payer: Self-pay | Admitting: Student in an Organized Health Care Education/Training Program

## 2016-04-26 ENCOUNTER — Ambulatory Visit (INDEPENDENT_AMBULATORY_CARE_PROVIDER_SITE_OTHER): Payer: Medicare Other | Admitting: Pharmacist

## 2016-04-26 DIAGNOSIS — Z7901 Long term (current) use of anticoagulants: Secondary | ICD-10-CM

## 2016-04-26 DIAGNOSIS — D682 Hereditary deficiency of other clotting factors: Secondary | ICD-10-CM | POA: Diagnosis not present

## 2016-04-26 LAB — POCT INR: INR: 2.4

## 2016-04-26 NOTE — Progress Notes (Signed)
Anti-Coagulation Progress Note  Judith [name] is a 80 y.o. female who is currently on an anti-coagulation regimen.    RECENT RESULTS: Recent results are below, the most recent result is correlated with a dose of 17.5 mg. per week: Lab Results  Component Value Date   INR 2.40 04/26/2016   INR 2.50 03/29/2016   INR 2.30 03/08/2016    ANTI-COAG DOSE: Anticoagulation Dose Instructions as of 04/26/2016      [name] Tue Wed Thu Fri Sat   New Dose 2.5 mg 2.5 mg 2.5 mg 2.5 mg 2.5 mg 2.5 mg 2.5 mg       ANTICOAG SUMMARY: Anticoagulation Episode Summary    Current INR goal 2.0-3.0  Next INR check 05/24/2016  INR from last check 2.40 (04/26/2016)  Weekly max dose   Target end date Indefinite  [facility]  Preferred lab   Send INR reminders to ANTICOAG [facility]   Indications  FACTOR V DEFICIENCY [D68.2] Long term current use of anticoagulant [Z79.01]        Comments Index VTE (DVT) was 2007. She was subsequently documented as having Leiden Factor V Deficiency. She has indicated a continued interest in remaining upon warfarin. Should re-visit this issue with patient annually. Currently "with the advice and consent of the patient--after review of risks vs. benefits--she elects to stay upon warfarin.      Anticoagulation Care Providers    Provider Role Specialty Phone number   Burman Freestone, MD  Internal Medicine [phone]      ANTICOAG TODAY: Anticoagulation Summary as of 04/26/2016    INR goal 2.0-3.0  Selected INR 2.40 (04/26/2016)  Next INR check 05/24/2016  Target end date Indefinite   Indications  FACTOR V DEFICIENCY [D68.2] Long term current use of anticoagulant [Z79.01]      Anticoagulation Episode Summary    [facility]   Preferred lab    Send INR reminders to ANTICOAG IMP   Comments Index VTE (DVT) was 2007. She was subsequently documented as having Leiden Factor V Deficiency. She has indicated a continued interest in  remaining upon warfarin. Should re-visit this issue with patient annually. Currently "with the advice and consent of the patient--after review of risks vs. benefits--she elects to stay upon warfarin.    Anticoagulation Care Providers    Provider Role Specialty Phone number   Burman Freestone, MD  Internal Medicine [phone]      PATIENT INSTRUCTIONS: Patient Instructions  Patient instructed to take medications as defined in the Anti-coagulation Track section of this encounter.  Patient instructed to take today's dose.  Patient verbalized understanding of these instructions.       FOLLOW-UP Return in 4 weeks (on 05/24/2016) for Follow up INR at 0930h.  [name], III Pharm.D., CACP

## 2016-04-26 NOTE — Patient Instructions (Signed)
Patient instructed to take medications as defined in the Anti-coagulation Track section of this encounter.  Patient instructed to take today's dose.  Patient verbalized understanding of these instructions.    

## 2016-05-04 ENCOUNTER — Other Ambulatory Visit: Payer: Self-pay | Admitting: *Deleted

## 2016-05-04 MED ORDER — LISINOPRIL-HYDROCHLOROTHIAZIDE 10-12.5 MG PO TABS: 1.0000 | ORAL_TABLET | Freq: Every day | ORAL | Status: DC

## 2016-05-04 NOTE — Telephone Encounter (Signed)
Requesting 90 day supply. Thanks 

## 2016-05-24 ENCOUNTER — Encounter: Payer: Self-pay | Admitting: Student in an Organized Health Care Education/Training Program

## 2016-05-24 ENCOUNTER — Ambulatory Visit (INDEPENDENT_AMBULATORY_CARE_PROVIDER_SITE_OTHER): Payer: Medicare Other | Admitting: Pharmacist

## 2016-05-24 ENCOUNTER — Ambulatory Visit (INDEPENDENT_AMBULATORY_CARE_PROVIDER_SITE_OTHER): Payer: Medicare Other | Admitting: Student in an Organized Health Care Education/Training Program

## 2016-05-24 VITALS — BP 140/58 | HR 65 | Temp 97.5°F | Ht 64.0 in | Wt 214.2 lb

## 2016-05-24 DIAGNOSIS — Z7901 Long term (current) use of anticoagulants: Secondary | ICD-10-CM

## 2016-05-24 DIAGNOSIS — D682 Hereditary deficiency of other clotting factors: Secondary | ICD-10-CM | POA: Diagnosis not present

## 2016-05-24 DIAGNOSIS — I10 Essential (primary) hypertension: Secondary | ICD-10-CM | POA: Diagnosis not present

## 2016-05-24 DIAGNOSIS — D6851 Activated protein C resistance: Secondary | ICD-10-CM

## 2016-05-24 DIAGNOSIS — M810 Age-related osteoporosis without current pathological fracture: Secondary | ICD-10-CM | POA: Diagnosis not present

## 2016-05-24 DIAGNOSIS — Z0000 Encounter for general adult medical examination without abnormal findings: Secondary | ICD-10-CM

## 2016-05-24 DIAGNOSIS — E785 Hyperlipidemia, unspecified: Secondary | ICD-10-CM | POA: Diagnosis not present

## 2016-05-24 DIAGNOSIS — G3184 Mild cognitive impairment, so stated: Secondary | ICD-10-CM | POA: Diagnosis not present

## 2016-05-24 LAB — POCT INR: INR: 2.6

## 2016-05-24 NOTE — Patient Instructions (Signed)
Patient instructed to take medications as defined in the Anti-coagulation Track section of this encounter.  Patient instructed to take today's dose.  Patient verbalized understanding of these instructions.    

## 2016-05-24 NOTE — Assessment & Plan Note (Signed)
Patient apparently started on aspirin and pravastatin around 2014 for primary prevention of ischemic vascular disease. She's done well with no history of stroke or MI. At this point I think the risk of aspirin causing bleeding in a patient already on Coumadin is too high to outweigh the small benefit for primary prevention. Plan to stop aspirin today. This statin might give her some benefit for primary prevention, but I do worry about its potential cognitive side effect especially as her mild cognitive impairment seems to be progressing into dementia. I stopped her Vesicare today, and hopefully that will stabilize her memory, repeat MMSE at our next visit in 3 months, if her memory declines much more I think I'm going to recommend discontinuing the statin therapy.

## 2016-05-24 NOTE — Patient Instructions (Signed)
1. It was great to meet you. We are going to check your blood work today.   2. Please stop taking Aspirin.   3. Please stop taking Vesicare, if you have trouble with urination let me know and you can restart the Vesicare if needed.   4. You do not need to follow up with gynecology unless you want to. I would recommend not doing pap smears or mammograms in the future. I will be happy to do your annual exams her in our office.

## 2016-05-24 NOTE — Progress Notes (Addendum)
   See Encounters tab for problem-based medical decision making  __________________________________________________________  HPI:  80 year old woman presents to clinic today for follow-up of hypertension. She has a recent history of mild cognitive impairment, reports having memory problems for several years. She tells me that her mother had Alzheimer's dementia and she is worried she may as well. Her husband is here and takes care of most of the patient's advanced activities of daily living, like driving and medication management. They deny any side effects to blood pressure medicines, no lightheadedness. She denies any urinary urgency or incontinence at this point. Eating and drinking well. Weight is stable. No chest pain. Sleeping well. No recent falls at home. No recent changes to her medications. She lives at home with her husband, does not drive anymore.   __________________________________________________________  Problem List: Patient Active Problem List   Diagnosis Date Noted  . MCI (mild cognitive impairment) 10/24/2013  . Hyperlipidemia 03/16/2011  . Preventative health care 03/16/2011  . Long term current use of anticoagulant 11/29/2010  . 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 Essential hypertension 01/16/2007  . Osteoporosis 01/16/2007    Medications: Reconciled today in Epic __________________________________________________________  Physical Exam:  Vital Signs: Filed Vitals:   05/24/16 0933  BP: 140/58  Pulse: 65  Temp: 97.5 F (36.4 C)  TempSrc: Oral  Height: 5\' 4"  (1.626 m)  Weight: 214 lb 3.2 oz (97.16 kg)  SpO2: 100%    Gen: Well appearing, NAD ENT: OP clear without erythema or exudate, mouth with repaired cleft palate.  Neck: No cervical LAD, No thyromegaly or nodules, No JVD. CV: RRR, no murmurs Pulm: Normal effort, CTA throughout, no wheezing Abd: Soft, NT, ND. Ext: Warm, compression stockings in place, 1+ bilateral edema is  symmetric. Skin: No atypical appearing moles. 2cm SK on her mid back without inflammation.

## 2016-05-24 NOTE — Progress Notes (Signed)
Anti-Coagulation Progress Note  Judith [name] is a 80 y.o. female who is currently on an anti-coagulation regimen.    RECENT RESULTS: Recent results are below, the most recent result is correlated with a dose of 17.5 mg. per week: Lab Results  Component Value Date   INR 2.60 05/24/2016   INR 2.40 04/26/2016   INR 2.50 03/29/2016    ANTI-COAG DOSE: Anticoagulation Dose Instructions as of 05/24/2016      [name] Tue Wed Thu Fri Sat   New Dose 2.5 mg 2.5 mg 2.5 mg 2.5 mg 2.5 mg 2.5 mg 2.5 mg       ANTICOAG SUMMARY: Anticoagulation Episode Summary    Current INR goal 2.0-3.0  Next INR check 06/21/2016  INR from last check 2.60 (05/24/2016)  Weekly max dose   Target end date Indefinite  [facility]  Preferred lab   Send INR reminders to ANTICOAG IMP   Indications  FACTOR V DEFICIENCY [D68.2] Long term current use of anticoagulant [Z79.01]        Comments Index VTE (DVT) was 2007. She was subsequently documented as having Leiden Factor V Deficiency. She has indicated a continued interest in remaining upon warfarin. Should re-visit this issue with patient annually. Currently "with the advice and consent of the patient--after review of risks vs. benefits--she elects to stay upon warfarin.      Anticoagulation Care Providers    Provider Role Specialty Phone number   Burman Freestone, MD  Internal Medicine [phone]      ANTICOAG TODAY: Anticoagulation Summary as of 05/24/2016    INR goal 2.0-3.0  Selected INR 2.60 (05/24/2016)  Next INR check 06/21/2016  Target end date Indefinite   Indications  FACTOR V DEFICIENCY [D68.2] Long term current use of anticoagulant [Z79.01]      Anticoagulation Episode Summary    [facility]   Preferred lab    Send INR reminders to ANTICOAG IMP   Comments Index VTE (DVT) was 2007. She was subsequently documented as having Leiden Factor V Deficiency. She has indicated a continued interest in  remaining upon warfarin. Should re-visit this issue with patient annually. Currently "with the advice and consent of the patient--after review of risks vs. benefits--she elects to stay upon warfarin.    Anticoagulation Care Providers    Provider Role Specialty Phone number   Burman Freestone, MD  Internal Medicine [phone]      PATIENT INSTRUCTIONS: Patient Instructions  Patient instructed to take medications as defined in the Anti-coagulation Track section of this encounter.  Patient instructed to take today's dose.  Patient verbalized understanding of these instructions.       FOLLOW-UP Return in 4 weeks (on 06/21/2016) for Follow up INR at 0915h.  [name], III Pharm.D., CACP

## 2016-05-24 NOTE — Assessment & Plan Note (Signed)
Diagnosed in 2013 by DEXA and tolerating alendronate well. I recommended 5 years of total therapy for her which should end around March 2018.

## 2016-05-24 NOTE — Assessment & Plan Note (Signed)
Cognitive impairment is advancing which makes preventive services lower yield in her case. After talking with her and husband, I recommended against future mammography. She has a history of CIN1, but normal pap results since at least 2013. Given advanced age and progressive memory decline, I also recommended against future cervical cancer screening. She is up to date on vaccinations.

## 2016-05-24 NOTE — Assessment & Plan Note (Signed)
Blood pressure is acceptable today. No reported side effects. Plan to continue amlodipine, lisinopril, and HCTZ. Check renal function today.

## 2016-05-24 NOTE — Assessment & Plan Note (Addendum)
Last MMSE done in 2015 was 25/30. She has a strong family history for Alzheimer's dementia, and that is the likely eventual diagnosis here. TSH and B12 screened normal as well. She relies on her husband for assistance with most of her advanced ADLs. Organic continue expectant management, I asked him to come back in 3 months for repeat MMSE and we can discuss a trial of Donepezil. Stopping Vesicare today, as likely the risk for cognitive side effects outweighs the benefits.

## 2016-05-24 NOTE — Addendum Note (Signed)
Addended by: Lalla Brothers T on: 05/24/2016 10:48 AM   Modules accepted: Miquel Dunn

## 2016-05-24 NOTE — Assessment & Plan Note (Signed)
Patient with a deep vein thrombosis in 2007 and subsequently found to be heterozygous for a factor V Leiden deficiency. She works closely with Dr. Elie Confer in our anticoagulation clinic. The patient and her husband have decided to continue anticoagulation indefinitely. No bleeding complications yet, I did ask her to stop Aspirin as I think the risk of bleeding outweighs the benefit of primary CVD prevention.

## 2016-05-25 LAB — BMP8+ANION GAP
Anion Gap: 19 mmol/L — ABNORMAL HIGH (ref 10.0–18.0)
BUN/Creatinine Ratio: 18 (ref 12–28)
BUN: 15 mg/dL (ref 8–27)
CO2: 21 mmol/L (ref 18–29)
Calcium: 9.3 mg/dL (ref 8.7–10.3)
Chloride: 95 mmol/L — ABNORMAL LOW (ref 96–106)
Creatinine, Ser: 0.82 mg/dL (ref 0.57–1.00)
GFR calc Af Amer: 79 mL/min/{1.73_m2} (ref >59)
GFR calc non Af Amer: 69 mL/min/{1.73_m2} (ref >59)
Glucose: 87 mg/dL (ref 65–99)
Potassium: 5 mmol/L (ref 3.5–5.2)
Sodium: 135 mmol/L (ref 134–144)

## 2016-05-26 ENCOUNTER — Encounter: Payer: Self-pay | Admitting: Student in an Organized Health Care Education/Training Program

## 2016-05-27 NOTE — Progress Notes (Signed)
I have reviewed Dr. Gladstone Pih note. Patient prefers to stay on [facility] for factor V deficiency.  INR at goal.

## 2016-06-21 ENCOUNTER — Ambulatory Visit (INDEPENDENT_AMBULATORY_CARE_PROVIDER_SITE_OTHER): Payer: Medicare Other | Admitting: Pharmacist

## 2016-06-21 ENCOUNTER — Encounter (INDEPENDENT_AMBULATORY_CARE_PROVIDER_SITE_OTHER): Payer: Self-pay

## 2016-06-21 DIAGNOSIS — D6851 Activated protein C resistance: Secondary | ICD-10-CM

## 2016-06-21 DIAGNOSIS — Z7901 Long term (current) use of anticoagulants: Secondary | ICD-10-CM

## 2016-06-21 LAB — POCT INR: INR: 2.6

## 2016-06-21 NOTE — Progress Notes (Signed)
INTERNAL MEDICINE TEACHING ATTENDING ADDENDUM - Aldine Contes M.D  Duration- indefinite, Indication- DVT, Factor V Leiden INR- therapeutic. Agree with pharmacy recommendations as outlined in their note.

## 2016-06-21 NOTE — Patient Instructions (Signed)
Patient instructed to take medications as defined in the Anti-coagulation Track section of this encounter.  Patient instructed to take today's dose.  Patient verbalized understanding of these instructions.    

## 2016-06-21 NOTE — Progress Notes (Signed)
Anti-Coagulation Progress Note  Judith [name] is a 80 y.o. female who is currently on an anti-coagulation regimen.    RECENT RESULTS: Recent results are below, the most recent result is correlated with a dose of 17.5 mg. per week: Lab Results  Component Value Date   INR 2.60 06/21/2016   INR 2.60 05/24/2016   INR 2.40 04/26/2016    ANTI-COAG DOSE: Anticoagulation Dose Instructions as of 06/21/2016      [name] Tue Wed Thu Fri Sat   New Dose 2.5 mg 2.5 mg 2.5 mg 2.5 mg 2.5 mg 2.5 mg 2.5 mg       ANTICOAG SUMMARY: Anticoagulation Episode Summary    Current INR goal:   2.0-3.0  TTR:   75.1 % (5.3 y)  Next INR check:   07/19/2016  INR from last check:   2.60 (06/21/2016)  Weekly max dose:     Target end date:   Indefinite  [facility]  Preferred lab:     Send INR reminders to:   ANTICOAG IMP   Indications   Factor V Leiden prothrombin gene mutation [facility]) [D68.51] Long term current use of anticoagulant [Z79.01]       Comments:   Index VTE (DVT) was 2007. She was subsequently documented as having Leiden Factor V Deficiency. She has indicated a continued interest in remaining upon warfarin. Should re-visit this issue with patient annually. Currently "with the advice and consent of the patient--after review of risks vs. benefits--she elects to stay upon warfarin.      Anticoagulation Care Providers    Provider Role Specialty Phone number   Burman Freestone, MD  Internal Medicine [phone]      ANTICOAG TODAY: Anticoagulation Summary  As of 06/21/2016   INR goal:   2.0-3.0  TTR:     Today's INR:   2.60  Next INR check:   07/19/2016  Target end date:   Indefinite   Indications   Factor V Leiden prothrombin gene mutation [facility]) [D68.51] Long term current use of anticoagulant [Z79.01]        Anticoagulation Episode Summary    [facility]   Preferred lab:      Send INR reminders to:   ANTICOAG IMP   Comments:    Index VTE (DVT) was 2007. She was subsequently documented as having Leiden Factor V Deficiency. She has indicated a continued interest in remaining upon warfarin. Should re-visit this issue with patient annually. Currently "with the advice and consent of the patient--after review of risks vs. benefits--she elects to stay upon warfarin.    Anticoagulation Care Providers    Provider Role Specialty Phone number   Burman Freestone, MD  Internal Medicine [phone]      PATIENT INSTRUCTIONS: Patient Instructions  Patient instructed to take medications as defined in the Anti-coagulation Track section of this encounter.  Patient instructed to take today's dose.  Patient verbalized understanding of these instructions.       FOLLOW-UP Return in about 4 weeks (around 07/19/2016) for Follow up INR at 0915h.  [name], III Pharm.D., CACP

## 2016-07-19 ENCOUNTER — Ambulatory Visit (INDEPENDENT_AMBULATORY_CARE_PROVIDER_SITE_OTHER): Payer: Medicare Other | Admitting: Pharmacist

## 2016-07-19 DIAGNOSIS — Z86718 Personal history of other venous thrombosis and embolism: Secondary | ICD-10-CM

## 2016-07-19 DIAGNOSIS — Z7901 Long term (current) use of anticoagulants: Secondary | ICD-10-CM

## 2016-07-19 DIAGNOSIS — D6851 Activated protein C resistance: Secondary | ICD-10-CM

## 2016-07-19 DIAGNOSIS — D6859 Other primary thrombophilia: Secondary | ICD-10-CM

## 2016-07-19 LAB — POCT INR: INR: 3

## 2016-07-19 NOTE — Progress Notes (Signed)
Anti-Coagulation Progress Note  Judith [name] is a 80 y.o. female who is currently on an anti-coagulation regimen.    RECENT RESULTS: Recent results are below, the most recent result is correlated with a dose of 17.5 mg. per week: Lab Results  Component Value Date   INR 3.00 07/19/2016   INR 2.60 06/21/2016   INR 2.60 05/24/2016    ANTI-COAG DOSE:    ANTICOAG SUMMARY: Anticoagulation Episode Summary    Current INR goal:   2.0-3.0  TTR:   75.4 % (5.4 y)  Next INR check:   07/19/2016  INR from last check:   2.60 (06/21/2016)  Most recent INR:    3.00 (07/19/2016)  Weekly max dose:     Target end date:   Indefinite  [facility]  Preferred lab:     Send INR reminders to:   ANTICOAG IMP   Indications   Factor V Leiden prothrombin gene mutation [facility]) [D68.51] Long term current use of anticoagulant [Z79.01]       Comments:   Index VTE (DVT) was 2007. She was subsequently documented as having Leiden Factor V Deficiency. She has indicated a continued interest in remaining upon warfarin. Should re-visit this issue with patient annually. Currently "with the advice and consent of the patient--after review of risks vs. benefits--she elects to stay upon warfarin.      Anticoagulation Care Providers    Provider Role Specialty Phone number   Burman Freestone, MD  Internal Medicine [phone]      ANTICOAG TODAY:   PATIENT INSTRUCTIONS: There are no Patient Instructions on file for this visit.   FOLLOW-UP Return in 3 weeks (on 08/09/2016) for Follow up INR at 0900h.  [name], III Pharm.D., CACP

## 2016-07-19 NOTE — Progress Notes (Signed)
Indication: Venous thromboembolism in setting of factor V Leiden heterozygosity. Duration: Indefinite per patient choice. INR: At target. Agree with Dr. Gladstone Pih assessment and plan.

## 2016-07-19 NOTE — Patient Instructions (Signed)
Patient instructed to take medications as defined in the Anti-coagulation Track section of this encounter.  Patient instructed to take today's dose.  Patient verbalized understanding of these instructions.    

## 2016-07-22 DIAGNOSIS — Z23 Encounter for immunization: Secondary | ICD-10-CM | POA: Diagnosis not present

## 2016-08-09 ENCOUNTER — Ambulatory Visit (INDEPENDENT_AMBULATORY_CARE_PROVIDER_SITE_OTHER): Payer: Medicare Other | Admitting: Pharmacist

## 2016-08-09 DIAGNOSIS — D6851 Activated protein C resistance: Secondary | ICD-10-CM

## 2016-08-09 DIAGNOSIS — Z7901 Long term (current) use of anticoagulants: Secondary | ICD-10-CM | POA: Diagnosis present

## 2016-08-09 LAB — POCT INR: INR: 2.1

## 2016-08-09 NOTE — Progress Notes (Signed)
Anti-Coagulation Progress Note  Judith [name] is a 80 y.o. female who is currently on an anti-coagulation regimen.    RECENT RESULTS: Recent results are below, the most recent result is correlated with a dose of 15 mg. per week: Lab Results  Component Value Date   INR 2.10 08/09/2016   INR 3.00 07/19/2016   INR 2.60 06/21/2016    ANTI-COAG DOSE: Anticoagulation Dose Instructions as of 08/09/2016      [name] Tue Wed Thu Fri Sat   New Dose 2.5 mg 2.5 mg 2.5 mg 2.5 mg 2.5 mg 2.5 mg 2.5 mg       ANTICOAG SUMMARY: Anticoagulation Episode Summary    Current INR goal:   2.0-3.0  TTR:   75.7 % (5.5 y)  Next INR check:   09/06/2016  INR from last check:   2.10 (08/09/2016)  Weekly max dose:     Target end date:   Indefinite  [facility] check location:   [facility]  Preferred lab:     Send INR reminders to:   ANTICOAG IMP   Indications   Factor V Leiden prothrombin gene mutation [facility]) [D68.51] Long term current use of anticoagulant [Z79.01]       Comments:   Index VTE (DVT) was 2007. She was subsequently documented as having Leiden Factor V Deficiency. She has indicated a continued interest in remaining upon warfarin. Should re-visit this issue with patient annually. Currently "with the advice and consent of the patient--after review of risks vs. benefits--she elects to stay upon warfarin.      Anticoagulation Care Providers    Provider Role Specialty Phone number   Burman Freestone, MD  Internal Medicine [phone]      ANTICOAG TODAY: Anticoagulation Summary  As of 08/09/2016   INR goal:   2.0-3.0  TTR:     Today's INR:   2.10  Next INR check:   09/06/2016  Target end date:   Indefinite   Indications   Factor V Leiden prothrombin gene mutation [facility]) [D68.51] Long term current use of anticoagulant [Z79.01]        Anticoagulation Episode Summary    [facility] check location[facility]   [facility]   Preferred lab:      Send INR reminders to:   ANTICOAG IMP   Comments:    Index VTE (DVT) was 2007. She was subsequently documented as having Leiden Factor V Deficiency. She has indicated a continued interest in remaining upon warfarin. Should re-visit this issue with patient annually. Currently "with the advice and consent of the patient--after review of risks vs. benefits--she elects to stay upon warfarin.    Anticoagulation Care Providers    Provider Role Specialty Phone number   Burman Freestone, MD  Internal Medicine [phone]      PATIENT INSTRUCTIONS: There are no Patient Instructions on file for this visit.   FOLLOW-UP Return in 4 weeks (on 09/06/2016) for Follow up INR at 0915h.  [name], III Pharm.D., CACP

## 2016-08-09 NOTE — Patient Instructions (Signed)
Patient instructed to take medications as defined in the Anti-coagulation Track section of this encounter.  Patient instructed to take today's dose.  Patient verbalized understanding of these instructions.    

## 2016-09-06 ENCOUNTER — Ambulatory Visit (INDEPENDENT_AMBULATORY_CARE_PROVIDER_SITE_OTHER): Payer: Medicare Other | Admitting: Pharmacist

## 2016-09-06 DIAGNOSIS — D6851 Activated protein C resistance: Secondary | ICD-10-CM | POA: Diagnosis not present

## 2016-09-06 DIAGNOSIS — Z7901 Long term (current) use of anticoagulants: Secondary | ICD-10-CM

## 2016-09-06 LAB — POCT INR: INR: 2.9

## 2016-09-06 NOTE — Patient Instructions (Signed)
Patient instructed to take medications as defined in the Anti-coagulation Track section of this encounter.  Patient instructed to take today's dose.  Patient verbalized understanding of these instructions.    

## 2016-09-06 NOTE — Progress Notes (Signed)
Indication: Heterozygous for Factor V Leiden diagnosed after DVT. Duration: Continued per patient preference. INR: At target. Agree with Dr. Gladstone Pih assessment and plan.

## 2016-09-06 NOTE — Progress Notes (Signed)
Anti-Coagulation Progress Note  Judith [name] is a 80 y.o. female who is currently on an anti-coagulation regimen.    RECENT RESULTS: Recent results are below, the most recent result is correlated with a dose of 17.5 mg. per week: Lab Results  Component Value Date   INR 2.90 09/06/2016   INR 2.10 08/09/2016   INR 3.00 07/19/2016    ANTI-COAG DOSE: Anticoagulation Dose Instructions as of 09/06/2016      [name] Tue Wed Thu Fri Sat   New Dose 2.5 mg 0 mg 2.5 mg 2.5 mg 2.5 mg 2.5 mg 2.5 mg       ANTICOAG SUMMARY: Anticoagulation Episode Summary    Current INR goal:   2.0-3.0  TTR:   76.0 % (5.5 y)  Next INR check:   10/04/2016  INR from last check:   2.90 (09/06/2016)  Weekly max dose:     Target end date:   Indefinite  [facility] check [facility]   [facility]  Preferred lab:     Send INR reminders to:   ANTICOAG IMP   Indications   Factor V Leiden prothrombin gene mutation [facility]) [D68.51] Long term current use of anticoagulant [Z79.01]       Comments:   Index VTE (DVT) was 2007. She was subsequently documented as having Leiden Factor V Deficiency. She has indicated a continued interest in remaining upon warfarin. Should re-visit this issue with patient annually. Currently "with the advice and consent of the patient--after review of risks vs. benefits--she elects to stay upon warfarin.      Anticoagulation Care Providers    Provider Role Specialty Phone number   Burman Freestone, MD  Internal Medicine [phone]      ANTICOAG TODAY: Anticoagulation Summary  As of 09/06/2016   INR goal:   2.0-3.0  TTR:     Today's INR:   2.90  Next INR check:   10/04/2016  Target end date:   Indefinite   Indications   Factor V Leiden prothrombin gene mutation [facility]) [D68.51] Long term current use of anticoagulant [Z79.01]        Anticoagulation Episode Summary    INR check location[facility]   [facility]   Preferred lab:      Send INR reminders to:   ANTICOAG IMP   Comments:    Index VTE (DVT) was 2007. She was subsequently documented as having Leiden Factor V Deficiency. She has indicated a continued interest in remaining upon warfarin. Should re-visit this issue with patient annually. Currently "with the advice and consent of the patient--after review of risks vs. benefits--she elects to stay upon warfarin.    Anticoagulation Care Providers    Provider Role Specialty Phone number   Burman Freestone, MD  Internal Medicine [phone]      PATIENT INSTRUCTIONS: Take 1/2 tablet of your 5mg  strength warfarin tablet by mouth once-daily, every day of week--EXCEPT on Mondays-do NOT take any warfarin on Mondays.     FOLLOW-UP Return in 4 weeks (on 10/04/2016) for Follow up INR at 0900h.  [name], III Pharm.D., CACP

## 2016-09-08 DIAGNOSIS — Z961 Presence of intraocular lens: Secondary | ICD-10-CM | POA: Diagnosis not present

## 2016-09-08 DIAGNOSIS — H52203 Unspecified astigmatism, bilateral: Secondary | ICD-10-CM | POA: Diagnosis not present

## 2016-10-04 ENCOUNTER — Ambulatory Visit (INDEPENDENT_AMBULATORY_CARE_PROVIDER_SITE_OTHER): Payer: Medicare Other | Admitting: Pharmacist

## 2016-10-04 DIAGNOSIS — D6851 Activated protein C resistance: Secondary | ICD-10-CM

## 2016-10-04 DIAGNOSIS — Z7901 Long term (current) use of anticoagulants: Secondary | ICD-10-CM

## 2016-10-04 LAB — POCT INR: INR: 2.6

## 2016-10-04 NOTE — Progress Notes (Signed)
Anti-Coagulation Progress Note  Judith [name] is a 80 y.o. female who is currently on an anti-coagulation regimen.    RECENT RESULTS: Recent results are below, the most recent result is correlated with a dose of 15 mg. per week: Lab Results  Component Value Date   INR 2.60 10/04/2016   INR 2.90 09/06/2016   INR 2.10 08/09/2016    ANTI-COAG DOSE: Anticoagulation Dose Instructions as of 10/04/2016      [name] Tue Wed Thu Fri Sat   New Dose 2.5 mg 0 mg 2.5 mg 2.5 mg 2.5 mg 2.5 mg 2.5 mg    Description   Take 1/2 tablet of your 5mg  strength warfarin tablet by mouth once-daily, every day of week--EXCEPT on Mondays-do NOT take any warfarin on Mondays.       ANTICOAG SUMMARY: Anticoagulation Episode Summary    Current INR goal:   2.0-3.0  TTR:   76.4 % (5.6 y)  Next INR check:   10/25/2016  INR from last check:   2.60 (10/04/2016)  Weekly max dose:     Target end date:   Indefinite  [facility]   [facility]  Preferred lab:     Send INR reminders to:   ANTICOAG IMP   Indications   Factor V Leiden prothrombin gene mutation [facility]) [D68.51] Long term current use of anticoagulant [Z79.01]       Comments:   Index VTE (DVT) was 2007. She was subsequently documented as having Leiden Factor V Deficiency. She has indicated a continued interest in remaining upon warfarin. Should re-visit this issue with patient annually. Currently "with the advice and consent of the patient--after review of risks vs. benefits--she elects to stay upon warfarin.      Anticoagulation Care Providers    Provider Role Specialty Phone number   Burman Freestone, MD  Internal Medicine [phone]      ANTICOAG TODAY: Anticoagulation Summary  As of 10/04/2016   INR goal:   2.0-3.0  TTR:     Today's INR:   2.60  Next INR check:   10/25/2016  Target end date:   Indefinite   Indications   Factor V Leiden prothrombin gene mutation [facility]) [D68.51] Long term current use of anticoagulant  [Z79.01]        Anticoagulation Episode Summary    INR check location[facility]   Preferred lab:      Send INR reminders to:   ANTICOAG IMP   Comments:   Index VTE (DVT) was 2007. She was subsequently documented as having Leiden Factor V Deficiency. She has indicated a continued interest in remaining upon warfarin. Should re-visit this issue with patient annually. Currently "with the advice and consent of the patient--after review of risks vs. benefits--she elects to stay upon warfarin.    Anticoagulation Care Providers    Provider Role Specialty Phone number   Burman Freestone, MD  Internal Medicine [phone]      PATIENT INSTRUCTIONS: Patient instructed to OMIT doses each Monday--all other days, take 1/2 tablet of your 5mg  strength warfarin tablet.   FOLLOW-UP Return in 3 weeks (on 10/25/2016) for Follow up INR at 0845h.  [name], III Pharm.D., CACP

## 2016-10-04 NOTE — Patient Instructions (Signed)
Patient instructed to take medications as defined in the Anti-coagulation Track section of this encounter.  Patient instructed to OMIT doses each Monday--all other days, take 1/2 tablet of your 5mg  strength warfarin tablet.  Patient verbalized understanding of these instructions.

## 2016-10-06 NOTE — Progress Notes (Signed)
INTERNAL MEDICINE TEACHING ATTENDING ADDENDUM - Judith Groves, DO Duration- indefinate, Indication- vte, factor v leiden, INR-  Lab Results  Component Value Date   INR 2.60 10/04/2016  . Agree with pharmacy recommendations as outlined in their note.

## 2016-10-21 ENCOUNTER — Other Ambulatory Visit: Payer: Self-pay | Admitting: Student in an Organized Health Care Education/Training Program

## 2016-10-25 ENCOUNTER — Encounter (INDEPENDENT_AMBULATORY_CARE_PROVIDER_SITE_OTHER): Payer: Self-pay

## 2016-10-25 ENCOUNTER — Ambulatory Visit (INDEPENDENT_AMBULATORY_CARE_PROVIDER_SITE_OTHER): Payer: Medicare Other | Admitting: Pharmacist

## 2016-10-25 DIAGNOSIS — Z7901 Long term (current) use of anticoagulants: Secondary | ICD-10-CM

## 2016-10-25 DIAGNOSIS — D6851 Activated protein C resistance: Secondary | ICD-10-CM | POA: Diagnosis not present

## 2016-10-25 LAB — POCT INR: INR: 2.5

## 2016-10-25 NOTE — Progress Notes (Signed)
Anti-Coagulation Progress Note  Judith [name] is a 80 y.o. female who is currently on an anti-coagulation regimen.    RECENT RESULTS: Recent results are below, the most recent result is correlated with a dose of 15 mg. per week: Lab Results  Component Value Date   INR 2.50 10/25/2016   INR 2.60 10/04/2016   INR 2.90 09/06/2016    ANTI-COAG DOSE: Anticoagulation Dose Instructions as of 10/25/2016      [name] Tue Wed Thu Fri Sat   New Dose 2.5 mg 0 mg 2.5 mg 2.5 mg 2.5 mg 2.5 mg 2.5 mg    Description   Take 1/2 tablet of your 5mg  strength warfarin tablet by mouth once-daily, every day of week--EXCEPT on Mondays-do NOT take any warfarin on Mondays.       ANTICOAG SUMMARY: Anticoagulation Episode Summary    Current INR goal:   2.0-3.0  TTR:   76.6 % (5.7 y)  Next INR check:   11/29/2016  INR from last check:   2.50 (10/25/2016)  Weekly max dose:     Target end date:   Indefinite  [facility] check [facility]   [facility]  Preferred lab:     Send INR reminders to:   ANTICOAG IMP   Indications   Factor V Leiden prothrombin gene mutation [facility]) [D68.51] Long term current use of anticoagulant [Z79.01]       Comments:   Index VTE (DVT) was 2007. She was subsequently documented as having Leiden Factor V Deficiency. She has indicated a continued interest in remaining upon warfarin. Should re-visit this issue with patient annually. Currently "with the advice and consent of the patient--after review of risks vs. benefits--she elects to stay upon warfarin.      Anticoagulation Care Providers    Provider Role Specialty Phone number   Burman Freestone, MD  Internal Medicine [phone]      ANTICOAG TODAY: Anticoagulation Summary  As of 10/25/2016   INR goal:   2.0-3.0  TTR:     Today's INR:   2.50  Next INR check:   11/29/2016  Target end date:   Indefinite   Indications   Factor V Leiden prothrombin gene mutation [facility]) [D68.51] Long term current use of anticoagulant  [Z79.01]        Anticoagulation Episode Summary    [facility] check [facility]   [facility]   Preferred lab:      Send INR reminders to:   ANTICOAG IMP   Comments:   Index VTE (DVT) was 2007. She was subsequently documented as having Leiden Factor V Deficiency. She has indicated a continued interest in remaining upon warfarin. Should re-visit this issue with patient annually. Currently "with the advice and consent of the patient--after review of risks vs. benefits--she elects to stay upon warfarin.    Anticoagulation Care Providers    Provider Role Specialty Phone number   Burman Freestone, MD  Internal Medicine [phone]      PATIENT INSTRUCTIONS: Patient instructed to take medications as defined in the Anti-coagulation Track section of this encounter.  Patient instructed to take today's dose.  Patient instructed to take 1/2 tablet of your 5mg  peach colored warfarin tablet by mouth daily--EXCEPT on Mondays. On Mondays--OMIT your dose--do not take any warfarin on Mondays.  Patient verbalized understanding of these instructions.     FOLLOW-UP Return in 5 weeks (on 11/29/2016) for Follow up INR at 0900h.  [name], III Pharm.D., CACP

## 2016-10-25 NOTE — Patient Instructions (Signed)
Patient instructed to take medications as defined in the Anti-coagulation Track section of this encounter.  Patient instructed to take today's dose.  Patient instructed to take 1/2 tablet of your 5mg  peach colored warfarin tablet by mouth daily--EXCEPT on Mondays. On Mondays--OMIT your dose--do not take any warfarin on Mondays.  Patient verbalized understanding of these instructions.

## 2016-10-25 NOTE — Progress Notes (Signed)
INTERNAL MEDICINE TEACHING ATTENDING ADDENDUM - Aldine Contes M.D  Duration- indefinite, Indication- DVT, PE, Factor V Leiden mutation, INR- therapeutic. Agree with pharmacy recommendations as outlined in their note.

## 2016-11-29 ENCOUNTER — Ambulatory Visit (INDEPENDENT_AMBULATORY_CARE_PROVIDER_SITE_OTHER): Payer: Medicare Other | Admitting: Pharmacist

## 2016-11-29 DIAGNOSIS — Z7901 Long term (current) use of anticoagulants: Secondary | ICD-10-CM

## 2016-11-29 DIAGNOSIS — D6851 Activated protein C resistance: Secondary | ICD-10-CM | POA: Diagnosis not present

## 2016-11-29 LAB — POCT INR: INR: 2.4

## 2016-11-29 NOTE — Patient Instructions (Signed)
Patient instructed to take medications as defined in the Anti-coagulation Track section of this encounter.  Patient instructed to OMIT doses each Monday.  Patient instructed to take 1/2 tablet on all other days--EXCEPT Mondays, on Mondays--do NOT take warfarin.   Patient verbalized understanding of these instructions.

## 2016-11-29 NOTE — Progress Notes (Signed)
Anti-Coagulation Progress Note  Judith [name] is a 81 y.o. female who is currently on an anti-coagulation regimen.    RECENT RESULTS: Recent results are below, the most recent result is correlated with a dose of 15 mg. per week: Lab Results  Component Value Date   INR 2.40 11/29/2016   INR 2.50 10/25/2016   INR 2.60 10/04/2016    ANTI-COAG DOSE: Anticoagulation Dose Instructions as of 11/29/2016      [name] Tue Wed Thu Fri Sat   New Dose 2.5 mg 0 mg 2.5 mg 2.5 mg 2.5 mg 2.5 mg 2.5 mg    Description   Take 1/2 tablet of your 5mg  strength warfarin tablet by mouth once-daily, every day of week--EXCEPT on Mondays-do NOT take any warfarin on Mondays.       ANTICOAG SUMMARY: Anticoagulation Episode Summary    Current INR goal:   2.0-3.0  TTR:   77.0 % (5.8 y)  Next INR check:   12/27/2016  INR from last check:   2.40 (11/29/2016)  Weekly max dose:     Target end date:   Indefinite  [facility] check [facility]   [facility]  Preferred lab:     Send INR reminders to:   ANTICOAG IMP   Indications   Factor V Leiden prothrombin gene mutation [facility]) [D68.51] Long term current use of anticoagulant [Z79.01]       Comments:   Index VTE (DVT) was 2007. She was subsequently documented as having Leiden Factor V Deficiency. She has indicated a continued interest in remaining upon warfarin. Should re-visit this issue with patient annually. Currently "with the advice and consent of the patient--after review of risks vs. benefits--she elects to stay upon warfarin.      Anticoagulation Care Providers    Provider Role Specialty Phone number   Burman Freestone, MD  Internal Medicine [phone]      ANTICOAG TODAY: Anticoagulation Summary  As of 11/29/2016   INR goal:   2.0-3.0  TTR:     Today's INR:   2.40  Next INR check:   12/27/2016  Target end date:   Indefinite   Indications   Factor V Leiden prothrombin gene mutation [facility]) [D68.51] Long term current use of anticoagulant  [Z79.01]        Anticoagulation Episode Summary    [facility] check [facility]   Preferred lab:      Send INR reminders to:   ANTICOAG IMP   Comments:   Index VTE (DVT) was 2007. She was subsequently documented as having Leiden Factor V Deficiency. She has indicated a continued interest in remaining upon warfarin. Should re-visit this issue with patient annually. Currently "with the advice and consent of the patient--after review of risks vs. benefits--she elects to stay upon warfarin.    Anticoagulation Care Providers    Provider Role Specialty Phone number   Burman Freestone, MD  Internal Medicine [phone]      PATIENT INSTRUCTIONS: Take 1/2 tablet of your 5mg  strength warfarin tablet by mouth once-daily, every day of week--EXCEPT on Mondays-do NOT take any warfarin on Mondays.     FOLLOW-UP Return in 4 weeks (on 12/27/2016) for Follow up INR at 0915.  [name], III Pharm.D., CACP

## 2016-11-29 NOTE — Progress Notes (Signed)
INTERNAL MEDICINE TEACHING ATTENDING ADDENDUM - Aldine Contes M.D  Duration- indefinite, Indication- VTE, factor V Leiden, INR- therapeutic. Agree with pharmacy recommendations as outlined in their note.

## 2016-12-14 ENCOUNTER — Other Ambulatory Visit: Payer: Self-pay | Admitting: Student in an Organized Health Care Education/Training Program

## 2016-12-17 ENCOUNTER — Other Ambulatory Visit: Payer: Self-pay | Admitting: Student in an Organized Health Care Education/Training Program

## 2016-12-27 ENCOUNTER — Ambulatory Visit (INDEPENDENT_AMBULATORY_CARE_PROVIDER_SITE_OTHER): Payer: Medicare Other | Admitting: Pharmacist

## 2016-12-27 DIAGNOSIS — Z7901 Long term (current) use of anticoagulants: Secondary | ICD-10-CM | POA: Diagnosis present

## 2016-12-27 DIAGNOSIS — D6851 Activated protein C resistance: Secondary | ICD-10-CM | POA: Diagnosis not present

## 2016-12-27 LAB — POCT INR: INR: 2

## 2016-12-27 NOTE — Patient Instructions (Signed)
Patient instructed to take medications as defined in the Anti-coagulation Track section of this encounter.  Patient instructed to take today's dose.  Patient instructed to take 1/2 tablet of your peach colored 5mg  strength warfarin tablet by mouth once-daily each day at [facility]. Patient verbalized understanding of these instructions.

## 2016-12-27 NOTE — Progress Notes (Signed)
Anti-Coagulation Progress Note  Judith [name] is a 81 y.o. female who is currently on an anti-coagulation regimen.    RECENT RESULTS: Recent results are below, the most recent result is correlated with a dose of 15 mg. per week: Lab Results  Component Value Date   INR 2.00 12/27/2016   INR 2.40 11/29/2016   INR 2.50 10/25/2016    ANTI-COAG DOSE: Anticoagulation Dose Instructions as of 12/27/2016      [name] Tue Wed Thu Fri Sat   New Dose 2.5 mg 2.5 mg 2.5 mg 2.5 mg 2.5 mg 2.5 mg 2.5 mg    Description   Take 1/2 tablet of your 5mg  strength warfarin tablet by mouth once-daily, every day of week.       ANTICOAG SUMMARY: Anticoagulation Episode Summary    Current INR goal:   2.0-3.0  TTR:   77.3 % (5.8 y)  Next INR check:   01/24/2017  INR from last check:   2.00 (12/27/2016)  Weekly max dose:     Target end date:   Indefinite  [facility] check [facility]   [facility]  Preferred lab:     Send INR reminders to:   ANTICOAG IMP   Indications   Factor V Leiden prothrombin gene mutation [facility]) [D68.51] Long term current use of anticoagulant [Z79.01]       Comments:   Index VTE (DVT) was 2007. She was subsequently documented as having Leiden Factor V Deficiency. She has indicated a continued interest in remaining upon warfarin. Should re-visit this issue with patient annually. Currently "with the advice and consent of the patient--after review of risks vs. benefits--she elects to stay upon warfarin.      Anticoagulation Care Providers    Provider Role Specialty Phone number   Burman Freestone, MD  Internal Medicine [phone]      ANTICOAG TODAY: Anticoagulation Summary  As of 12/27/2016   INR goal:   2.0-3.0  TTR:     Today's INR:   2.00  Next INR check:   01/24/2017  Target end date:   Indefinite   Indications   Factor V Leiden prothrombin gene mutation [facility]) [D68.51] Long term current use of anticoagulant [Z79.01]        Anticoagulation Episode Summary    INR  check location[facility]   Preferred lab:      Send INR reminders to:   ANTICOAG IMP   Comments:   Index VTE (DVT) was 2007. She was subsequently documented as having Leiden Factor V Deficiency. She has indicated a continued interest in remaining upon warfarin. Should re-visit this issue with patient annually. Currently "with the advice and consent of the patient--after review of risks vs. benefits--she elects to stay upon warfarin.    Anticoagulation Care Providers    Provider Role Specialty Phone number   Burman Freestone, MD  Internal Medicine [phone]      Patient instructed to take medications as defined in the Anti-coagulation Track section of this encounter.  Patient instructed to take today's dose.  Patient instructed to take 1/2 tablet of your peach colored 5mg  strength warfarin tablet by mouth once-daily each day at [facility]. Patient verbalized understanding of these instructions.    PATIENT INSTRUCTIONS:   FOLLOW-UP Return in about 4 weeks (around 01/24/2017) for Follow up INR at 0900h.  [name], III Pharm.D., CACP

## 2016-12-27 NOTE — Progress Notes (Signed)
Indication: h/o DVT and found to be heterozygous for factor V Lieden. Duration: Indefinite per patient's PCP and her informed consent. INR: At target. Agree with Dr. Gladstone Pih assessment and plan.

## 2017-01-04 DIAGNOSIS — L309 Dermatitis, unspecified: Secondary | ICD-10-CM | POA: Diagnosis not present

## 2017-01-24 ENCOUNTER — Ambulatory Visit (INDEPENDENT_AMBULATORY_CARE_PROVIDER_SITE_OTHER): Payer: Medicare Other | Admitting: Pharmacist

## 2017-01-24 DIAGNOSIS — D6851 Activated protein C resistance: Secondary | ICD-10-CM | POA: Diagnosis not present

## 2017-01-24 DIAGNOSIS — Z7901 Long term (current) use of anticoagulants: Secondary | ICD-10-CM | POA: Diagnosis present

## 2017-01-24 LAB — POCT INR: INR: 2.9

## 2017-01-24 NOTE — Progress Notes (Signed)
Anti-Coagulation Progress Note  Judith [name] is a 81 y.o. female who is currently on an anti-coagulation regimen.    RECENT RESULTS: Recent results are below, the most recent result is correlated with a dose of 17.5 mg. per week: Lab Results  Component Value Date   INR 2.90 01/24/2017   INR 2.00 12/27/2016   INR 2.40 11/29/2016    ANTI-COAG DOSE: Anticoagulation Dose Instructions as of 01/24/2017      [name] Tue Wed Thu Fri Sat   New Dose 2.5 mg 2.5 mg 2.5 mg 2.5 mg 2.5 mg 2.5 mg 2.5 mg    Description   Take 1/2 tablet of your 5mg  strength warfarin tablet by mouth once-daily, every day of week--EXCEPT on MONDAYS--do NOT take any warfarin on Mondays.       ANTICOAG SUMMARY: Anticoagulation Episode Summary    Current INR goal:   2.0-3.0  TTR:   77.6 % (5.9 y)  Next INR check:   02/21/2017  INR from last check:   2.90 (01/24/2017)  Weekly max dose:     Target end date:   Indefinite  [facility]  Preferred lab:     Send INR reminders to:   ANTICOAG IMP   Indications   Factor V Leiden prothrombin gene mutation [facility]) [D68.51] Long term current use of anticoagulant [Z79.01]       Comments:   Index VTE (DVT) was 2007. She was subsequently documented as having Leiden Factor V Deficiency. She has indicated a continued interest in remaining upon warfarin. Should re-visit this issue with patient annually. Currently "with the advice and consent of the patient--after review of risks vs. benefits--she elects to stay upon warfarin.      Anticoagulation Care Providers    Provider Role Specialty Phone number   Burman Freestone, MD  Internal Medicine [phone]      ANTICOAG TODAY: Anticoagulation Summary  As of 01/24/2017   INR goal:   2.0-3.0  TTR:     Today's INR:   2.90  Next INR check:   02/21/2017  Target end date:   Indefinite   Indications   Factor V Leiden prothrombin gene mutation [facility]) [D68.51] Long term current use of anticoagulant  [Z79.01]        Anticoagulation Episode Summary    [facility] check [facility]   Preferred lab:      Send INR reminders to:   ANTICOAG IMP   Comments:   Index VTE (DVT) was 2007. She was subsequently documented as having Leiden Factor V Deficiency. She has indicated a continued interest in remaining upon warfarin. Should re-visit this issue with patient annually. Currently "with the advice and consent of the patient--after review of risks vs. benefits--she elects to stay upon warfarin.    Anticoagulation Care Providers    Provider Role Specialty Phone number   Burman Freestone, MD  Internal Medicine [phone]      PATIENT INSTRUCTIONS: Patient Instructions  Patient instructed to take medications as defined in the Anti-coagulation Track section of this encounter.  Patient instructed to NOT today's dose.  Patient instructed to take 1/2 tablet of your 5mg  peach-colored warfarin tablet(s) by mouth once-daily at 6PM--EXCEPT on MONDAYS:  Do NOT take any warfarin on Mondays.  Patient verbalized understanding of these instructions.       FOLLOW-UP Return in 4 weeks (on 02/21/2017) for Follow up INR at 0845h.  [name], III Pharm.D., CACP

## 2017-01-24 NOTE — Progress Notes (Signed)
INTERNAL MEDICINE TEACHING ATTENDING ADDENDUM - Judith Groves, DO Duration- indefinate, Indication- VTE, factor V leiden, INR-  Lab Results  Component Value Date   INR 2.90 01/24/2017  . Agree with pharmacy recommendations as outlined in their note.

## 2017-01-24 NOTE — Patient Instructions (Signed)
Patient instructed to take medications as defined in the Anti-coagulation Track section of this encounter.  Patient instructed to NOT today's dose.  Patient instructed to take 1/2 tablet of your 5mg  peach-colored warfarin tablet(s) by mouth once-daily at 6PM--EXCEPT on MONDAYS:  Do NOT take any warfarin on Mondays.  Patient verbalized understanding of these instructions.

## 2017-02-15 DIAGNOSIS — L309 Dermatitis, unspecified: Secondary | ICD-10-CM | POA: Diagnosis not present

## 2017-02-21 ENCOUNTER — Ambulatory Visit (INDEPENDENT_AMBULATORY_CARE_PROVIDER_SITE_OTHER): Payer: Medicare Other | Admitting: Student in an Organized Health Care Education/Training Program

## 2017-02-21 ENCOUNTER — Ambulatory Visit (INDEPENDENT_AMBULATORY_CARE_PROVIDER_SITE_OTHER): Payer: Medicare Other | Admitting: Pharmacist

## 2017-02-21 ENCOUNTER — Encounter (INDEPENDENT_AMBULATORY_CARE_PROVIDER_SITE_OTHER): Payer: Self-pay

## 2017-02-21 ENCOUNTER — Encounter: Payer: Self-pay | Admitting: Student in an Organized Health Care Education/Training Program

## 2017-02-21 VITALS — BP 126/62 | HR 83 | Temp 97.8°F | Ht 64.0 in | Wt 218.7 lb

## 2017-02-21 DIAGNOSIS — Z79899 Other long term (current) drug therapy: Secondary | ICD-10-CM | POA: Diagnosis not present

## 2017-02-21 DIAGNOSIS — Z7901 Long term (current) use of anticoagulants: Secondary | ICD-10-CM

## 2017-02-21 DIAGNOSIS — D6851 Activated protein C resistance: Secondary | ICD-10-CM | POA: Diagnosis not present

## 2017-02-21 DIAGNOSIS — G301 Alzheimer's disease with late onset: Secondary | ICD-10-CM

## 2017-02-21 DIAGNOSIS — Z86718 Personal history of other venous thrombosis and embolism: Secondary | ICD-10-CM | POA: Diagnosis not present

## 2017-02-21 DIAGNOSIS — I10 Essential (primary) hypertension: Secondary | ICD-10-CM | POA: Diagnosis not present

## 2017-02-21 DIAGNOSIS — Z86711 Personal history of pulmonary embolism: Secondary | ICD-10-CM | POA: Diagnosis not present

## 2017-02-21 DIAGNOSIS — G3184 Mild cognitive impairment, so stated: Secondary | ICD-10-CM

## 2017-02-21 DIAGNOSIS — M810 Age-related osteoporosis without current pathological fracture: Secondary | ICD-10-CM | POA: Diagnosis not present

## 2017-02-21 DIAGNOSIS — F0280 Dementia in other diseases classified elsewhere without behavioral disturbance: Secondary | ICD-10-CM

## 2017-02-21 LAB — POCT INR: INR: 2.8

## 2017-02-21 MED ORDER — DONEPEZIL HCL 5 MG PO TABS: 5.0000 mg | ORAL_TABLET | Freq: Every day | ORAL | 3 refills | Status: DC

## 2017-02-21 MED ORDER — RIVAROXABAN 20 MG PO TABS: 20.0000 mg | ORAL_TABLET | Freq: Every day | ORAL | 5 refills | Status: DC

## 2017-02-21 NOTE — Assessment & Plan Note (Signed)
Functionally doing okay at home, lives with her husband. Independent in activities of daily living but requires significant help with complex activities. Not able to drive anymore. MMSE score of 22/30 today, down from 25/30 at last visit. Given the memory deficit causing functional decline for complex activities, I think she fits a moderate alzheimer diagnosis at this point. We stopped vesicare at last visit. We discussed pharmacotherapy today, I set reasonable expectations, and we will start donepezil 5mg  daily. Follow up in 3 months and we can increase to 10 mg daily if she tolerates it well.

## 2017-02-21 NOTE — Assessment & Plan Note (Signed)
Life long anticoagulation for DVT/PE in 2007 and subsequently found to have factor V Leiden deficiency. They are interested in changing to [place] due to the burden of INR checks. She has good renal function, and is low risk for bleeding complications, so I think this is fine. INR today is 2.8. Ok to hold coumadin today and start rivaroxaban 20mg  daily tomorrow. Follow up with me in 3 months and gave her precautions to take this medicine with food and call me for any side effects. I communicated this with Dr. Elie Confer who also agrees with the switch.

## 2017-02-21 NOTE — Assessment & Plan Note (Signed)
Diagnosed on DEXA scan in 2013 and has completed 5 years of therapy with alendronate. Plan to discontinue bisphosphonate today as likely doesn't have much further benefits and the patient values reducing pill burden.

## 2017-02-21 NOTE — Patient Instructions (Signed)
Patient instructed to take medications as defined in the Anti-coagulation Track section of this encounter.  Patient instructed to OMIT today's dose.  Patient instructed to take 1/2 tablet of your 5mg  peach-colored warfarin tablet by mouth, once-daily at 6PM--EXCEPT ON MONDAYS--DO NOT TAKE ANY WARFARIN ON MONDAYS. Patient verbalized understanding of these instructions.

## 2017-02-21 NOTE — Progress Notes (Signed)
Assessment and Plan:  See Encounters tab for problem-based medical decision making.   __________________________________________________________  HPI:  81 year old woman comes to the clinic today for follow-up of memory impairment. She struggled with progressive memory decline since at least 2015. She lives with her husband who helps her with most of her advanced activities of daily living, including shopping and transportation. The patient struggles with remembering most events of the day including meals, names, and events. There's been no behavioral problems. No wandering. Husband says that she is doing well at home and there is no safety concerns. No recent illnesses or hospitalizations. No changes in her medications. She has no acute complaints. No fevers or chills, no dyspnea with exertion, no chest pain. Sleeping well. She reports good compliance with her medications so she is reliant on her husband to arrange some for her.  __________________________________________________________  Problem List: Patient Active Problem List   Diagnosis Date Noted  . Alzheimer's dementia 10/24/2013    Priority: High  . Long term current use of anticoagulant 11/29/2010    Priority: High  . Factor V Leiden, prothrombin gene mutation ([place]) 06/19/2009    Priority: High  . Essential hypertension 01/16/2007    Priority: Medium  . Osteoporosis 01/16/2007    Priority: Medium  . Hyperlipidemia 03/16/2011    Priority: Low  . Preventative health care 03/16/2011    Priority: Low    Medications: Reconciled today in Epic __________________________________________________________  Physical Exam:  Vital Signs: Vitals:   02/21/17 0903 02/21/17 0933 02/21/17 0934  BP: (!) 126/45 (!) 129/50 126/62  Pulse: 75 69 83  Temp: 97.8 F (36.6 C)    TempSrc: Oral    SpO2: 98%    Weight: 218 lb 11.2 oz (99.2 kg)    Height: 5\' 4"  (1.626 m)      Gen: Well appearing, NAD ENT: OP clear without erythema or  exudate.  CV: RRR, no murmurs Pulm: Normal effort, CTA throughout, no wheezing Abd: Soft, NT, ND, normal BS.  Ext: Warm, trace bilateral pitting edema, compression stockings in place, normal joints Skin: No atypical appearing moles. No rashes Neuro: Conversational, follows commands, disoriented, full strength in upper and lower extremities, broad based shuffling gait.

## 2017-02-21 NOTE — Assessment & Plan Note (Signed)
Blood pressure is well-controlled today. She has some occasional dizziness with standing, including today in the exam room. Orthostatic vital signs were normal. Plan to continue lisinopril, HCTZ, and amlodipine. Check renal function today.

## 2017-02-21 NOTE — Progress Notes (Signed)
Anti-Coagulation Progress Note  Judith [name] is a 81 y.o. female who is currently on an anti-coagulation regimen.    RECENT RESULTS: Recent results are below, the most recent result is correlated with a dose of 15 mg. per week: Lab Results  Component Value Date   INR 2.80 02/21/2017   INR 2.90 01/24/2017   INR 2.00 12/27/2016    ANTI-COAG DOSE: Anticoagulation Dose Instructions as of 02/21/2017      [name] Tue Wed Thu Fri Sat   New Dose 2.5 mg 0 mg 2.5 mg 2.5 mg 2.5 mg 2.5 mg 2.5 mg    Description   Take 1/2 tablet of your 5mg  strength warfarin tablet by mouth once-daily, every day of week--EXCEPT on MONDAYS--do NOT take any warfarin on Mondays.       ANTICOAG SUMMARY: Anticoagulation Episode Summary    Current INR goal:   2.0-3.0  TTR:   77.9 % (6 y)  Next INR check:   03/21/2017  INR from last check:   2.80 (02/21/2017)  Weekly max dose:     Target end date:   Indefinite  [facility]R check location[facility]   [facility]  Preferred lab:     Send INR reminders to:   ANTICOAG IMP   Indications   Factor V Leiden prothrombin gene mutation [facility]) [D68.51] Long term current use of anticoagulant [Z79.01]       Comments:   Index VTE (DVT) was 2007. She was subsequently documented as having Leiden Factor V Deficiency. She has indicated a continued interest in remaining upon warfarin. Should re-visit this issue with patient annually. Currently "with the advice and consent of the patient--after review of risks vs. benefits--she elects to stay upon warfarin.      Anticoagulation Care Providers    Provider Role Specialty Phone number   Burman Freestone, MD  Internal Medicine [phone]      ANTICOAG TODAY: Anticoagulation Summary  As of 02/21/2017   INR goal:   2.0-3.0  TTR:     Today's INR:   2.80  Next INR check:   03/21/2017  Target end date:   Indefinite   Indications   Factor V Leiden prothrombin gene mutation [facility]) [D68.51] Long term current use of anticoagulant [Z79.01]        Anticoagulation Episode Summary    INR check location[facility]   [facility]   Preferred lab:      Send INR reminders to:   ANTICOAG IMP   Comments:   Index VTE (DVT) was 2007. She was subsequently documented as having Leiden Factor V Deficiency. She has indicated a continued interest in remaining upon warfarin. Should re-visit this issue with patient annually. Currently "with the advice and consent of the patient--after review of risks vs. benefits--she elects to stay upon warfarin.    Anticoagulation Care Providers    Provider Role Specialty Phone number   Burman Freestone, MD  Internal Medicine [phone]      PATIENT INSTRUCTIONS: Patient Instructions  Patient instructed to take medications as defined in the Anti-coagulation Track section of this encounter.  Patient instructed to OMIT today's dose.  Patient instructed to take 1/2 tablet of your 5mg  peach-colored warfarin tablet by mouth, once-daily at 6PM--EXCEPT ON MONDAYS--DO NOT TAKE ANY WARFARIN ON MONDAYS. Patient verbalized understanding of these instructions.       FOLLOW-UP Return in 4 weeks (on 03/21/2017) for Follow up INR at 0845h.  [name], III Pharm.D., CACP

## 2017-02-21 NOTE — Patient Instructions (Signed)
1. We will change your blood thinner from coumadin to rivaroxaban. This is a new class of blood thinner which does not require you to come for monitoring every month. I will see you again in three months. You can stop coumadin today, and start taking rivaroxaban tomorrow.   2. You may stop taking alendronate. You have finished 5 years of treatment for osteoporosis.   3. We will start Aricept for your memory impairment. This medicine is designed to slow the progression of memory loss, but unfortunately does not improve your memory from where we are right now.   Please call me if you have any side effects to any of these medication changes.

## 2017-02-22 ENCOUNTER — Encounter: Payer: Self-pay | Admitting: Student in an Organized Health Care Education/Training Program

## 2017-02-22 LAB — BMP8+ANION GAP
Anion Gap: 17 mmol/L (ref 10.0–18.0)
BUN/Creatinine Ratio: 31 — ABNORMAL HIGH (ref 12–28)
BUN: 25 mg/dL (ref 8–27)
CO2: 21 mmol/L (ref 18–29)
Calcium: 9.7 mg/dL (ref 8.7–10.3)
Chloride: 101 mmol/L (ref 96–106)
Creatinine, Ser: 0.81 mg/dL (ref 0.57–1.00)
GFR calc Af Amer: 80 mL/min/{1.73_m2} (ref >59)
GFR calc non Af Amer: 69 mL/min/{1.73_m2} (ref >59)
Glucose: 87 mg/dL (ref 65–99)
Potassium: 4.9 mmol/L (ref 3.5–5.2)
Sodium: 139 mmol/L (ref 134–144)

## 2017-03-30 ENCOUNTER — Other Ambulatory Visit: Payer: Self-pay | Admitting: *Deleted

## 2017-03-30 MED ORDER — AMLODIPINE BESYLATE 10 MG PO TABS: 10.0000 mg | ORAL_TABLET | Freq: Every day | ORAL | 1 refills | Status: DC

## 2017-03-30 NOTE — Telephone Encounter (Signed)
Received fax from pharmacy requesting to dispense 90-day supply of amlodipine 10mg  tabs.  Will send to pcp for review.Despina Hidden Cassady5/23/20182:23 PM

## 2017-05-10 ENCOUNTER — Encounter: Payer: Self-pay | Admitting: Pharmacist

## 2017-05-10 NOTE — Progress Notes (Signed)
Judith [name] is a 81 y.o. female who was reviewed for monitoring of rivaroxaban (Xarelto) therapy.    ASSESSMENT Indication(s): Heterozygous for Facto V Leiden S/p DVT/PE   Duration: indefinite  Labs: Component Value Date/Time   AST 22 06/14/2014 1208   ALT 14 06/14/2014 1208   NA 139 02/21/2017 0952   K 4.9 02/21/2017 0952   CL 101 02/21/2017 0952   CO2 21 02/21/2017 0952   GLUCOSE 87 02/21/2017 0952   GLUCOSE 76 02/13/2015 1603   BUN 25 02/21/2017 0952   CREATININE 0.81 02/21/2017 0952   CREATININE 0.80 02/13/2015 1603   CALCIUM 9.7 02/21/2017 0952   GFRNONAA 69 02/21/2017 0952   GFRNONAA 71 02/13/2015 1603   GFRAA 80 02/21/2017 0952   GFRAA 82 02/13/2015 1603   WBC 7.8 06/14/2014 1208   HGB 13.2 06/14/2014 1208   HCT 37.8 06/14/2014 1208   PLT 328 06/14/2014 1208   rivaroxaban (Xarelto) Dose: 20 mg daily  No concerns at this time, [facility] pharmacy records indicate consistent refills.  Flossie Dibble Clinical Pharmacist  05/10/2017, 2:04 PM

## 2017-07-13 ENCOUNTER — Other Ambulatory Visit: Payer: Self-pay | Admitting: Student in an Organized Health Care Education/Training Program

## 2017-07-31 DIAGNOSIS — Z23 Encounter for immunization: Secondary | ICD-10-CM | POA: Diagnosis not present

## 2017-08-08 ENCOUNTER — Other Ambulatory Visit: Payer: Self-pay | Admitting: Student in an Organized Health Care Education/Training Program

## 2017-08-22 ENCOUNTER — Ambulatory Visit (INDEPENDENT_AMBULATORY_CARE_PROVIDER_SITE_OTHER): Payer: Medicare Other | Admitting: Student in an Organized Health Care Education/Training Program

## 2017-08-22 ENCOUNTER — Encounter: Payer: Self-pay | Admitting: Student in an Organized Health Care Education/Training Program

## 2017-08-22 VITALS — BP 135/49 | HR 69 | Temp 98.4°F | Ht 64.0 in | Wt 211.7 lb

## 2017-08-22 DIAGNOSIS — F0280 Dementia in other diseases classified elsewhere without behavioral disturbance: Secondary | ICD-10-CM

## 2017-08-22 DIAGNOSIS — M1710 Unilateral primary osteoarthritis, unspecified knee: Secondary | ICD-10-CM | POA: Insufficient documentation

## 2017-08-22 DIAGNOSIS — M170 Bilateral primary osteoarthritis of knee: Secondary | ICD-10-CM | POA: Diagnosis not present

## 2017-08-22 DIAGNOSIS — G301 Alzheimer's disease with late onset: Secondary | ICD-10-CM

## 2017-08-22 DIAGNOSIS — M179 Osteoarthritis of knee, unspecified: Secondary | ICD-10-CM | POA: Insufficient documentation

## 2017-08-22 DIAGNOSIS — D6851 Activated protein C resistance: Secondary | ICD-10-CM

## 2017-08-22 DIAGNOSIS — I10 Essential (primary) hypertension: Secondary | ICD-10-CM | POA: Diagnosis not present

## 2017-08-22 MED ORDER — RIVAROXABAN 20 MG PO TABS: ORAL_TABLET | ORAL | 3 refills | Status: DC

## 2017-08-22 NOTE — Assessment & Plan Note (Signed)
Symptoms and exam are most consistent with bilateral knee osteoarthritis, likely patellofemoral and lateral compartments given her valgus knee deformity. Right worse than left. She functionally she is doing very well with only mild symptoms. Plan is to continue supportively, ibuprofen 600 mg as needed on the bad days. Follow-up if pain worsens.

## 2017-08-22 NOTE — Progress Notes (Signed)
   Assessment and Plan:  See Encounters tab for problem-based medical decision making.   __________________________________________________________  HPI:   81 year old woman here for follow-up of hypertension. Patient also has dementia, lives with her husband, requires assistance though is mostly independent in her activities of daily living. She does not drive anymore. At her last visit we started donepezil and she is tolerating it well. She feels safe at home, is thankful for the support she has. Husband reports that her memory and functional status has been stable over the last 6 months. Denies any safety concerns. Reports good compliance with all of her medications without any side effects. No recent illnesses, hospitalizations, emergency department visits. Denies any easy bleeding or bruising. Occasionally has right greater than left knee pain, but not functional limiting.  __________________________________________________________  Problem List: Patient Active Problem List   Diagnosis Date Noted  . Alzheimer's dementia 10/24/2013    Priority: High  . Long term current use of anticoagulant 11/29/2010    Priority: High  . Factor V Leiden, prothrombin gene mutation ([place]) 06/19/2009    Priority: High  . Essential hypertension 01/16/2007    Priority: Medium  . Osteoporosis 01/16/2007    Priority: Medium  . Knee osteoarthritis 08/22/2017    Priority: Low  . Hyperlipidemia 03/16/2011    Priority: Low  . Preventative health care 03/16/2011    Priority: Low    Medications: Reconciled today in Epic __________________________________________________________  Physical Exam:  Vital Signs: Vitals:   08/22/17 1050 08/22/17 1103  BP: (!) 144/47 (!) 135/49  Pulse: 68 69  Temp: 98.4 F (36.9 C)   TempSrc: Oral   SpO2: 100%   Weight: 211 lb 11.2 oz (96 kg)   Height: 5\' 4"  (1.626 m)     Gen: Well appearing, NAD CV: RRR, no murmurs Pulm: Normal effort, CTA throughout, no  wheezing Ext: Warm, no edema, bilateral valgus deformities of her lower extremities, right knee with moderate crepitus, no effusions appreciated. Skin: No atypical appearing moles. No rashes. Multiple actinic keratoses and a few seborrheic keratoses on her back and face.

## 2017-08-22 NOTE — Patient Instructions (Signed)
1. Continue all your medications as prescribed.   2. You have mild arthritis in both knees. For now, please use Ibuprofen 600mg  daily only on bad days when the knee pain is preventing your from doing things you want to do. If your pain gets worse, or more frequent, let me know and we can talk about other options.

## 2017-08-22 NOTE — Assessment & Plan Note (Signed)
Patient is planned for lifelong anticoagulation due to history of unprovoked venous thromboembolism. 6 months ago we transitioned from Coumadin to rivaroxaban which went well with no adverse side effects. Renal function is stable. Plan is to continue with rivaroxaban 20 mg once daily indefinitely.

## 2017-08-22 NOTE — Assessment & Plan Note (Signed)
Memory deficits are symptomatically stable. Tolerating donepezil well with no side effects. She is still independent in her activities of daily living, though she does need assistance. Lives at home with her husband and receives significant support from him. No plans currently for assisted living facilities. Plan is to continue with donepezil 5 mg once daily.

## 2017-08-22 NOTE — Assessment & Plan Note (Signed)
Blood pressure is well controlled. Plan to continue lisinopril, HCTZ, and amlodipine. Check renal function in 6 months.

## 2017-10-13 ENCOUNTER — Other Ambulatory Visit: Payer: Self-pay | Admitting: Student in an Organized Health Care Education/Training Program

## 2017-11-17 DIAGNOSIS — Z961 Presence of intraocular lens: Secondary | ICD-10-CM | POA: Diagnosis not present

## 2017-11-17 DIAGNOSIS — H52203 Unspecified astigmatism, bilateral: Secondary | ICD-10-CM | POA: Diagnosis not present

## 2017-11-17 DIAGNOSIS — H5202 Hypermetropia, left eye: Secondary | ICD-10-CM | POA: Diagnosis not present

## 2018-01-25 ENCOUNTER — Encounter: Payer: Self-pay | Admitting: *Deleted

## 2018-02-08 ENCOUNTER — Other Ambulatory Visit: Payer: Self-pay | Admitting: Student in an Organized Health Care Education/Training Program

## 2018-02-13 ENCOUNTER — Ambulatory Visit: Payer: Medicare Other | Admitting: Student in an Organized Health Care Education/Training Program

## 2018-02-27 ENCOUNTER — Encounter: Payer: Self-pay | Admitting: Student in an Organized Health Care Education/Training Program

## 2018-02-27 ENCOUNTER — Ambulatory Visit (INDEPENDENT_AMBULATORY_CARE_PROVIDER_SITE_OTHER): Payer: Medicare Other | Admitting: Student in an Organized Health Care Education/Training Program

## 2018-02-27 VITALS — BP 119/44 | HR 54 | Temp 98.4°F | Wt 205.5 lb

## 2018-02-27 DIAGNOSIS — E785 Hyperlipidemia, unspecified: Secondary | ICD-10-CM

## 2018-02-27 DIAGNOSIS — L570 Actinic keratosis: Secondary | ICD-10-CM

## 2018-02-27 DIAGNOSIS — L821 Other seborrheic keratosis: Secondary | ICD-10-CM

## 2018-02-27 DIAGNOSIS — Z79899 Other long term (current) drug therapy: Secondary | ICD-10-CM | POA: Diagnosis not present

## 2018-02-27 DIAGNOSIS — R2681 Unsteadiness on feet: Secondary | ICD-10-CM | POA: Diagnosis not present

## 2018-02-27 DIAGNOSIS — I10 Essential (primary) hypertension: Secondary | ICD-10-CM | POA: Diagnosis not present

## 2018-02-27 DIAGNOSIS — Z23 Encounter for immunization: Secondary | ICD-10-CM

## 2018-02-27 DIAGNOSIS — F0280 Dementia in other diseases classified elsewhere without behavioral disturbance: Secondary | ICD-10-CM | POA: Diagnosis not present

## 2018-02-27 DIAGNOSIS — D6851 Activated protein C resistance: Secondary | ICD-10-CM

## 2018-02-27 DIAGNOSIS — Z7901 Long term (current) use of anticoagulants: Secondary | ICD-10-CM

## 2018-02-27 DIAGNOSIS — G301 Alzheimer's disease with late onset: Principal | ICD-10-CM

## 2018-02-27 DIAGNOSIS — M1710 Unilateral primary osteoarthritis, unspecified knee: Secondary | ICD-10-CM | POA: Diagnosis not present

## 2018-02-27 DIAGNOSIS — G309 Alzheimer's disease, unspecified: Secondary | ICD-10-CM

## 2018-02-27 NOTE — Assessment & Plan Note (Signed)
Hypercoagulable state on indefinite anticoagulation. This is stable, doing well on Rivaroxaban. Plan to continue at 20mg  daily. Check renal funciton today.

## 2018-02-27 NOTE — Patient Instructions (Signed)
1. Continue taking your medicines as prescribed.  I will call you about your cholesterol levels to see if we still need a cholesterol medicine.

## 2018-02-27 NOTE — Progress Notes (Signed)
   Assessment and Plan:  See Encounters tab for problem-based medical decision making.   __________________________________________________________  HPI:   82 year old woman here for follow-up of Alzheimer's dementia.  She is accompanied by her husband today who helps provide most of the history.  Patient has no complaints today.  They tell me she is doing well at home.  Good compliance with medications.  No adverse side effects.  No easy bleeding or bruising on anticoagulation.  No recent hospitalizations or visits to the emergency department.  Patient lives with her husband and relies on him for help with many of her activities of daily living.  She is unsteady on her feet, but has not had any falls.  Does not exercise, leaves the house under careful supervision.  No chest pain, fevers, chills.  Mild knee osteoarthritis but no pain not function limiting.  __________________________________________________________  Problem List: Patient Active Problem List   Diagnosis Date Noted  . Alzheimer's dementia 10/24/2013    Priority: High  . Long term current use of anticoagulant 11/29/2010    Priority: High  . Factor V Leiden, prothrombin gene mutation ([place]) 06/19/2009    Priority: High  . Essential hypertension 01/16/2007    Priority: Medium  . Osteoporosis 01/16/2007    Priority: Medium  . Knee osteoarthritis 08/22/2017    Priority: Low  . Hyperlipidemia 03/16/2011    Priority: Low  . Preventative health care 03/16/2011    Priority: Low    Medications: Reconciled today in Epic __________________________________________________________  Physical Exam:  Vital Signs: Vitals:   02/27/18 0959 02/27/18 1035  BP: (!) 161/56 (!) 119/44  Pulse: 74 (!) 54  Temp: 98.4 F (36.9 C)   TempSrc: Oral   SpO2: 98%   Weight: 205 lb 8 oz (93.2 kg)     Gen: Well appearing, NAD Neck: No cervical LAD, No thyromegaly or nodules, No JVD. CV: RRR, no murmurs Pulm: Normal effort, CTA  throughout, no wheezing  Ext: Warm, no edema, mild crepitus bilateral knees, no effusions Skin: No atypical appearing moles. No rashes, several seborrheic keratoses and actinic keratoses on her face and back.

## 2018-02-27 NOTE — Assessment & Plan Note (Signed)
Stable Alzheimer's dementia.  Doing well at home with lots of support from her husband.  Plan to continue donepezil 5 mg daily.  No adverse side effects noted.

## 2018-02-27 NOTE — Assessment & Plan Note (Signed)
Blood pressure fluctuates, high on presentation but then at goal towards the end of the visit. I asked her husband to try to take more readings at home, they already have a home blood pressure cuff.  Otherwise plan to continue with lisinopril 10, HCTZ 12.5, and amlodipine 10 mg daily.  Check creatinine today.

## 2018-02-27 NOTE — Assessment & Plan Note (Signed)
Patient historically was using pravastatin for primary prevention of ischemic vascular disease.  Last lipids looked good in 2016.  There was a mixup and the patient discontinued pravastatin about 4 months ago.  Given her advanced age, dementia, it is less clear if she is good to have much benefit from further pravastatin.  Plan is to recheck lipids today and based off those results I can talk with her and husband about possible benefits in restarting pravastatin.

## 2018-02-28 ENCOUNTER — Telehealth: Payer: Self-pay | Admitting: Student in an Organized Health Care Education/Training Program

## 2018-02-28 LAB — BMP8+ANION GAP
Anion Gap: 19 mmol/L — ABNORMAL HIGH (ref 10.0–18.0)
BUN/Creatinine Ratio: 22 (ref 12–28)
BUN: 19 mg/dL (ref 8–27)
CO2: 20 mmol/L (ref 20–29)
Calcium: 10.3 mg/dL (ref 8.7–10.3)
Chloride: 100 mmol/L (ref 96–106)
Creatinine, Ser: 0.87 mg/dL (ref 0.57–1.00)
GFR calc Af Amer: 73 mL/min/{1.73_m2} (ref >59)
GFR calc non Af Amer: 63 mL/min/{1.73_m2} (ref >59)
Glucose: 85 mg/dL (ref 65–99)
Potassium: 5.4 mmol/L — ABNORMAL HIGH (ref 3.5–5.2)
Sodium: 139 mmol/L (ref 134–144)

## 2018-02-28 LAB — LIPID PANEL
Chol/HDL Ratio: 3.5 ratio (ref 0.0–4.4)
Cholesterol, Total: 201 mg/dL — ABNORMAL HIGH (ref 100–199)
HDL: 58 mg/dL (ref >39)
LDL Calculated: 109 mg/dL — ABNORMAL HIGH (ref 0–99)
Triglycerides: 169 mg/dL — ABNORMAL HIGH (ref 0–149)
VLDL Cholesterol Cal: 34 mg/dL (ref 5–40)

## 2018-02-28 MED ORDER — PRAVASTATIN SODIUM 20 MG PO TABS: 20.0000 mg | ORAL_TABLET | Freq: Every day | ORAL | 3 refills | Status: DC

## 2018-02-28 NOTE — Telephone Encounter (Signed)
I called patient and husband today about cholesterol results.  Total cholesterol, and LDL are elevated, she is been off pravastatin for about 3 months.  10-year ASCVD risk 28%.  I recommended restarting pravastatin for primary prevention of ischemic vascular disease.  Patient and husband were agreeable.

## 2018-02-28 NOTE — Addendum Note (Signed)
Addended by: Marcelino Duster on: 02/28/2018 01:14 PM   Modules accepted: Orders

## 2018-07-14 ENCOUNTER — Other Ambulatory Visit: Payer: Self-pay | Admitting: Student in an Organized Health Care Education/Training Program

## 2018-07-28 DIAGNOSIS — Z23 Encounter for immunization: Secondary | ICD-10-CM | POA: Diagnosis not present

## 2018-09-09 ENCOUNTER — Other Ambulatory Visit: Payer: Self-pay | Admitting: Student in an Organized Health Care Education/Training Program

## 2018-09-23 ENCOUNTER — Other Ambulatory Visit: Payer: Self-pay | Admitting: Student in an Organized Health Care Education/Training Program

## 2018-11-20 DIAGNOSIS — H5202 Hypermetropia, left eye: Secondary | ICD-10-CM | POA: Diagnosis not present

## 2018-11-20 DIAGNOSIS — Z961 Presence of intraocular lens: Secondary | ICD-10-CM | POA: Diagnosis not present

## 2018-12-04 ENCOUNTER — Encounter: Payer: Self-pay | Admitting: *Deleted

## 2019-02-12 ENCOUNTER — Other Ambulatory Visit: Payer: Self-pay | Admitting: Student in an Organized Health Care Education/Training Program

## 2019-02-25 ENCOUNTER — Other Ambulatory Visit: Payer: Self-pay | Admitting: Student in an Organized Health Care Education/Training Program

## 2019-06-08 ENCOUNTER — Other Ambulatory Visit: Payer: Self-pay

## 2019-07-05 DIAGNOSIS — Z23 Encounter for immunization: Secondary | ICD-10-CM | POA: Diagnosis not present

## 2019-07-09 ENCOUNTER — Other Ambulatory Visit: Payer: Self-pay | Admitting: Internal Medicine

## 2019-07-09 NOTE — Telephone Encounter (Signed)
Patient hasn't been seen since April 2019. Can you please arrange for an office visit with me in the next 1-2 months.

## 2019-08-06 ENCOUNTER — Encounter (INDEPENDENT_AMBULATORY_CARE_PROVIDER_SITE_OTHER): Payer: Self-pay

## 2019-08-06 ENCOUNTER — Encounter: Payer: Self-pay | Admitting: Student in an Organized Health Care Education/Training Program

## 2019-08-06 ENCOUNTER — Ambulatory Visit (INDEPENDENT_AMBULATORY_CARE_PROVIDER_SITE_OTHER): Payer: Medicare Other | Admitting: Student in an Organized Health Care Education/Training Program

## 2019-08-06 ENCOUNTER — Other Ambulatory Visit: Payer: Self-pay

## 2019-08-06 VITALS — BP 122/49 | HR 73 | Temp 98.3°F | Wt 204.6 lb

## 2019-08-06 DIAGNOSIS — G301 Alzheimer's disease with late onset: Secondary | ICD-10-CM | POA: Diagnosis not present

## 2019-08-06 DIAGNOSIS — Z79899 Other long term (current) drug therapy: Secondary | ICD-10-CM | POA: Diagnosis not present

## 2019-08-06 DIAGNOSIS — R011 Cardiac murmur, unspecified: Secondary | ICD-10-CM

## 2019-08-06 DIAGNOSIS — D6859 Other primary thrombophilia: Secondary | ICD-10-CM | POA: Diagnosis not present

## 2019-08-06 DIAGNOSIS — Z7901 Long term (current) use of anticoagulants: Secondary | ICD-10-CM | POA: Diagnosis not present

## 2019-08-06 DIAGNOSIS — I10 Essential (primary) hypertension: Secondary | ICD-10-CM | POA: Diagnosis not present

## 2019-08-06 DIAGNOSIS — E785 Hyperlipidemia, unspecified: Secondary | ICD-10-CM

## 2019-08-06 DIAGNOSIS — Z86718 Personal history of other venous thrombosis and embolism: Secondary | ICD-10-CM

## 2019-08-06 DIAGNOSIS — D6851 Activated protein C resistance: Secondary | ICD-10-CM | POA: Diagnosis not present

## 2019-08-06 DIAGNOSIS — G309 Alzheimer's disease, unspecified: Secondary | ICD-10-CM | POA: Diagnosis not present

## 2019-08-06 DIAGNOSIS — F0280 Dementia in other diseases classified elsewhere without behavioral disturbance: Secondary | ICD-10-CM

## 2019-08-06 NOTE — Assessment & Plan Note (Signed)
On pravastatin for primary prevention of ischemic vascular disease.  Plan to recheck lipids today.

## 2019-08-06 NOTE — Assessment & Plan Note (Signed)
Husband reports memory is a little worse last few months, functional status is stable.  Lives at home, requires a lot of assistance from her husband.  He has no safety concerns at this time.  He did not report having a healthcare power of attorney in place, says that she does have a living will.  We talked about arranging those documents in the future, especially important if he wants to escalate her support services.

## 2019-08-06 NOTE — Assessment & Plan Note (Signed)
Diastolic blood pressure is low today, 122/37.  This is about the same on recheck.  No diastolic murmur on exam, I doubt aortic insufficiency.  She had some symptoms of dizziness when standing to the exam table.  Plan is to discontinue amlodipine 10 mg.  Continue with lisinopril and HCTZ 10-12.5 daily.  Check BMP and CBC today.

## 2019-08-06 NOTE — Assessment & Plan Note (Signed)
Patient with unprovoked VTE, hypercoagulable state likely due to factor V Leiden mutation.  Currently anticoagulated with Xarelto, doing well.  No bleeding adverse effects.  Plan to check renal function today.  Continue with indefinite anticoagulation.

## 2019-08-06 NOTE — Progress Notes (Signed)
   Assessment and Plan:  See Encounters tab for problem-based medical decision making.   __________________________________________________________  HPI:   83 year old woman with significant Alzheimer's dementia here for follow-up of hypertension.  Accompanied by her husband today.  She reports feeling well, has no complaints.  Husband reports that she is stable at home.  No safety concerns.  Memory deficit is severe.  She is still fairly functional at home.  He is able to leave her alone for a few hours at a time, but mostly she does require supervision.  He reports compliance with her medications without adverse side effect.  She is anticoagulated for history of VTE unprovoked.  Denies bleeding, no hematuria, no hematochezia.  No fevers or chills.  No chest pain or shortness of breath.  __________________________________________________________  Problem List: Patient Active Problem List   Diagnosis Date Noted  . Alzheimer's dementia ([place]) 10/24/2013    Priority: High  . Long term current use of anticoagulant 11/29/2010    Priority: High  . Factor V Leiden, prothrombin gene mutation ([place]) 06/19/2009    Priority: High  . Essential hypertension 01/16/2007    Priority: Medium  . Osteoporosis 01/16/2007    Priority: Medium  . Knee osteoarthritis 08/22/2017    Priority: Low  . Hyperlipidemia 03/16/2011    Priority: Low  . Preventative health care 03/16/2011    Priority: Low    Medications: Reconciled today in Epic __________________________________________________________  Physical Exam:  Vital Signs: Vitals:   08/06/19 0902 08/06/19 0904  BP: (!) 122/37 (!) 122/49  Pulse: 78 73  Temp: 98.3 F (36.8 C)   TempSrc: Oral   SpO2: 98%   Weight: 204 lb 9.6 oz (92.8 kg)     Gen: Well appearing, NAD.  Slow and unsteady to get to the exam table Neck: No cervical LAD, No thyromegaly or nodules CV: RRR, 2 out of 6 early systolic murmur at the right upper sternal border, no  diastolic murmur, normal pulses Abd: Soft, NT, ND  Ext: Warm, no edema, normal joints

## 2019-08-06 NOTE — Patient Instructions (Signed)
It was great seeing you today.  Your blood pressure is a little low, and you had some symptoms of dizziness in the office.  Please stop taking amlodipine, this is a blood pressure medicine that might be too strong for you at this point.  I am going to check some labs to see if there is a cause from low blood pressure.  Continue taking your other medicines as prescribed.  At your next visit, please bring a copy of your advanced care plans and healthcare power of attorney if you have it.  This would be helpful for [place] to have in your medical record.

## 2019-08-07 ENCOUNTER — Encounter: Payer: Self-pay | Admitting: Student in an Organized Health Care Education/Training Program

## 2019-08-07 LAB — BMP8+ANION GAP
Anion Gap: 15 mmol/L (ref 10.0–18.0)
BUN/Creatinine Ratio: 23 (ref 12–28)
BUN: 23 mg/dL (ref 8–27)
CO2: 21 mmol/L (ref 20–29)
Calcium: 9.7 mg/dL (ref 8.7–10.3)
Chloride: 101 mmol/L (ref 96–106)
Creatinine, Ser: 0.99 mg/dL (ref 0.57–1.00)
GFR calc Af Amer: 62 mL/min/{1.73_m2} (ref >59)
GFR calc non Af Amer: 54 mL/min/{1.73_m2} — ABNORMAL LOW (ref >59)
Glucose: 100 mg/dL — ABNORMAL HIGH (ref 65–99)
Potassium: 5 mmol/L (ref 3.5–5.2)
Sodium: 137 mmol/L (ref 134–144)

## 2019-08-07 LAB — LIPID PANEL
Chol/HDL Ratio: 3 ratio (ref 0.0–4.4)
Cholesterol, Total: 166 mg/dL (ref 100–199)
HDL: 56 mg/dL (ref >39)
LDL Chol Calc (NIH): 87 mg/dL (ref 0–99)
Triglycerides: 133 mg/dL (ref 0–149)
VLDL Cholesterol Cal: 23 mg/dL (ref 5–40)

## 2019-08-07 LAB — CBC
Hematocrit: 38.3 % (ref 34.0–46.6)
Hemoglobin: 12.9 g/dL (ref 11.1–15.9)
MCH: 31.3 pg (ref 26.6–33.0)
MCHC: 33.7 g/dL (ref 31.5–35.7)
MCV: 93 fL (ref 79–97)
Platelets: 346 10*3/uL (ref 150–450)
RBC: 4.12 x10E6/uL (ref 3.77–5.28)
RDW: 11.4 % — ABNORMAL LOW (ref 11.7–15.4)
WBC: 7 10*3/uL (ref 3.4–10.8)

## 2019-08-07 LAB — TSH: TSH: 1.91 u[IU]/mL (ref 0.450–4.500)

## 2019-08-08 ENCOUNTER — Other Ambulatory Visit: Payer: Self-pay | Admitting: *Deleted

## 2019-08-09 MED ORDER — DONEPEZIL HCL 5 MG PO TABS: 5.0000 mg | ORAL_TABLET | Freq: Every day | ORAL | 1 refills | Status: DC

## 2019-10-30 ENCOUNTER — Other Ambulatory Visit: Payer: Self-pay | Admitting: Student in an Organized Health Care Education/Training Program

## 2019-11-23 DIAGNOSIS — H5202 Hypermetropia, left eye: Secondary | ICD-10-CM | POA: Diagnosis not present

## 2019-11-23 DIAGNOSIS — Z961 Presence of intraocular lens: Secondary | ICD-10-CM | POA: Diagnosis not present

## 2020-01-22 ENCOUNTER — Other Ambulatory Visit: Payer: Self-pay | Admitting: Student in an Organized Health Care Education/Training Program

## 2020-02-08 ENCOUNTER — Other Ambulatory Visit: Payer: Self-pay | Admitting: Student in an Organized Health Care Education/Training Program

## 2020-02-12 DIAGNOSIS — Z23 Encounter for immunization: Secondary | ICD-10-CM | POA: Diagnosis not present

## 2020-02-13 NOTE — Telephone Encounter (Signed)
Appt sch with Dr. Evette Doffing for 03/24/2020.

## 2020-02-26 ENCOUNTER — Other Ambulatory Visit: Payer: Self-pay | Admitting: Student in an Organized Health Care Education/Training Program

## 2020-03-10 ENCOUNTER — Other Ambulatory Visit: Payer: Self-pay | Admitting: *Deleted

## 2020-03-10 MED ORDER — PRAVASTATIN SODIUM 20 MG PO TABS: 20.0000 mg | ORAL_TABLET | Freq: Every day | ORAL | 3 refills | Status: DC

## 2020-03-11 DIAGNOSIS — Z23 Encounter for immunization: Secondary | ICD-10-CM | POA: Diagnosis not present

## 2020-03-24 ENCOUNTER — Encounter: Payer: Self-pay | Admitting: Student in an Organized Health Care Education/Training Program

## 2020-03-24 ENCOUNTER — Ambulatory Visit (INDEPENDENT_AMBULATORY_CARE_PROVIDER_SITE_OTHER): Payer: Medicare Other | Admitting: Student in an Organized Health Care Education/Training Program

## 2020-03-24 VITALS — BP 134/49 | HR 61 | Temp 98.1°F | Ht 64.0 in | Wt 193.7 lb

## 2020-03-24 DIAGNOSIS — G301 Alzheimer's disease with late onset: Secondary | ICD-10-CM | POA: Diagnosis not present

## 2020-03-24 DIAGNOSIS — D6851 Activated protein C resistance: Secondary | ICD-10-CM | POA: Diagnosis not present

## 2020-03-24 DIAGNOSIS — H9193 Unspecified hearing loss, bilateral: Secondary | ICD-10-CM

## 2020-03-24 DIAGNOSIS — F0280 Dementia in other diseases classified elsewhere without behavioral disturbance: Secondary | ICD-10-CM | POA: Diagnosis not present

## 2020-03-24 DIAGNOSIS — I10 Essential (primary) hypertension: Secondary | ICD-10-CM

## 2020-03-24 DIAGNOSIS — H9190 Unspecified hearing loss, unspecified ear: Secondary | ICD-10-CM | POA: Insufficient documentation

## 2020-03-24 NOTE — Assessment & Plan Note (Signed)
Stable Alzheimer's dementia.  Functionally doing well, living at home with husband.  No falls, no behavior disturbance, no safety concerns.  On brief testing today she did well on identifying 3 objects, calculation, and drew a perfect clock face.  Plan to continue with donepezil 5 mg daily, family has reasonable expectations.  Sounds like they have plenty of equipment at home.

## 2020-03-24 NOTE — Assessment & Plan Note (Signed)
Patient with hypercoagulable state with unprovoked VTE in the past.  Doing well on full dose anticoagulation.  Normal renal function.  No bleeding events.  Plan to continue with Xarelto 20 mg daily.

## 2020-03-24 NOTE — Progress Notes (Signed)
   Assessment and Plan:  See Encounters tab for problem-based medical decision making.   __________________________________________________________  HPI:   84 year old person living with dementia here for follow-up of hypertension and hypercoagulable state.  Accompanied by her husband today.  Doing well at home.  No acute concerns today.  Received second Covid vaccination 2 weeks ago.  No recent fevers or chills.  Husband reports memory seems to be getting worse at home.  Also seems to have low hearing, can hear anything of the television.  Patient does not think she has a hearing problem.  Has trouble understanding language especially through masks.  No falls at home.  No safety concerns.  No wandering behavior, aggressive outbursts, or other behavior disturbance.  Good adherence with medications without side effects.  __________________________________________________________  Problem List: Patient Active Problem List   Diagnosis Date Noted  . Alzheimer's dementia ([place]) 10/24/2013    Priority: High  . Long term current use of anticoagulant 11/29/2010    Priority: High  . Factor V Leiden, prothrombin gene mutation ([place]) 06/19/2009    Priority: High  . Essential hypertension 01/16/2007    Priority: Medium  . Osteoporosis 01/16/2007    Priority: Medium  . Hearing loss 03/24/2020    Priority: Low  . Knee osteoarthritis 08/22/2017    Priority: Low  . Hyperlipidemia 03/16/2011    Priority: Low  . Preventative health care 03/16/2011    Priority: Low    Medications: Reconciled today in Epic __________________________________________________________  Physical Exam:  Vital Signs: Vitals:   03/24/20 1024  BP: (!) 134/49  Pulse: 61  Temp: 98.1 F (36.7 C)  TempSrc: Oral  SpO2: 98%  Weight: 193 lb 11.2 oz (87.9 kg)  Height: 5\' 4"  (1.626 m)    Gen: Well appearing, NAD CV: RRR, no murmurs Pulm: Normal effort, CTA throughout, no wheezing Ext: Warm, no edema, normal  joints Neuro: Alert to self, does not know the year or date, conversational, full strength in the upper and lower extremities, able to identify pen, ring, cup.  Able to calculate nickel, dime, quarter.  Appropriately drew a clock face, numbers were appropriately spaced out, hands of the clock were placed correctly.

## 2020-03-24 NOTE — Assessment & Plan Note (Signed)
Blood pressure well controlled.  Continue with combination lisinopril and HCTZ 10-12.5 mg daily.  Check BMP at next visit.

## 2020-03-24 NOTE — Patient Instructions (Signed)
Call me if you want a referral for a hearing aide evaluation

## 2020-03-24 NOTE — Assessment & Plan Note (Signed)
Bilateral moderate hearing loss.  Low hearing on finger rub test.  Offered referral to ENT for evaluation but patient declines.  Can think about it and call me if she wants hearing aids in the future.  I think hearing aids would help especially given comorbid dementia.

## 2020-07-15 ENCOUNTER — Other Ambulatory Visit: Payer: Self-pay | Admitting: Student in an Organized Health Care Education/Training Program

## 2020-08-04 DIAGNOSIS — Z23 Encounter for immunization: Secondary | ICD-10-CM | POA: Diagnosis not present

## 2020-10-08 DIAGNOSIS — Z23 Encounter for immunization: Secondary | ICD-10-CM | POA: Diagnosis not present

## 2020-12-01 ENCOUNTER — Other Ambulatory Visit: Payer: Self-pay | Admitting: Student in an Organized Health Care Education/Training Program

## 2020-12-01 ENCOUNTER — Other Ambulatory Visit: Payer: Self-pay | Admitting: Internal Medicine

## 2021-01-22 ENCOUNTER — Other Ambulatory Visit: Payer: Self-pay | Admitting: Student in an Organized Health Care Education/Training Program

## 2021-01-22 ENCOUNTER — Other Ambulatory Visit: Payer: Self-pay | Admitting: Internal Medicine

## 2021-02-23 ENCOUNTER — Encounter: Payer: Self-pay | Admitting: Student in an Organized Health Care Education/Training Program

## 2021-02-23 ENCOUNTER — Ambulatory Visit (INDEPENDENT_AMBULATORY_CARE_PROVIDER_SITE_OTHER): Payer: Medicare Other | Admitting: Student in an Organized Health Care Education/Training Program

## 2021-02-23 VITALS — BP 141/41 | HR 63 | Temp 98.0°F | Ht 64.0 in | Wt 193.7 lb

## 2021-02-23 DIAGNOSIS — D6852 Prothrombin gene mutation: Secondary | ICD-10-CM

## 2021-02-23 DIAGNOSIS — I10 Essential (primary) hypertension: Secondary | ICD-10-CM

## 2021-02-23 DIAGNOSIS — Z0000 Encounter for general adult medical examination without abnormal findings: Secondary | ICD-10-CM

## 2021-02-23 DIAGNOSIS — D6851 Activated protein C resistance: Secondary | ICD-10-CM

## 2021-02-23 DIAGNOSIS — G301 Alzheimer's disease with late onset: Secondary | ICD-10-CM

## 2021-02-23 DIAGNOSIS — E785 Hyperlipidemia, unspecified: Secondary | ICD-10-CM

## 2021-02-23 DIAGNOSIS — F0280 Dementia in other diseases classified elsewhere without behavioral disturbance: Secondary | ICD-10-CM

## 2021-02-23 MED ORDER — RIVAROXABAN 10 MG PO TABS: 10.0000 mg | ORAL_TABLET | Freq: Every day | ORAL | 3 refills | Status: DC

## 2021-02-23 NOTE — Assessment & Plan Note (Signed)
Stable functional status at home.  Husband is her caretaker, helps her with her activities of daily living.  They have no safety concerns.  No plans for escalation of her care at this point.  Continuing with donepezil 5 mg daily, family has reasonable expectations.  I encouraged her to seek hearing aids, talked about how hearing loss can worsen memory over time.

## 2021-02-23 NOTE — Assessment & Plan Note (Signed)
Stable.  No ischemic events.  Plan to continue with pravastatin 20 mg daily.  Check lipids today.

## 2021-02-23 NOTE — Assessment & Plan Note (Signed)
Blood pressure is acceptable, systolic pressure is a little high but her diastolic is low likely due to advanced age.  She has an occasional dizzy symptom with standing, so not to push the antihypertensives any further.  Plan to continue with lisinopril 10 mg and HCTZ grams daily and a combination pill.  Check renal function today.

## 2021-02-23 NOTE — Progress Notes (Signed)
   Assessment and Plan:  See Encounters tab for problem-based medical decision making.   __________________________________________________________  HPI:   85 year old person here for follow-up of Alzheimer's disease and hypercoagulable state.  Accompanied by her husband.  Patient has no complaints, denies any chest pain or shortness of breath.  Good exertional capacity at home.  Husband reports that she is doing well, he has no safety concerns.  Reports good adherence with medications and denies any adverse side effects.  __________________________________________________________  Problem List: Patient Active Problem List   Diagnosis Date Noted  . Alzheimer's dementia ([place]) 10/24/2013    Priority: High  . Long term current use of anticoagulant 11/29/2010    Priority: High  . Factor V Leiden, prothrombin gene mutation ([place]) 06/19/2009    Priority: High  . Essential hypertension 01/16/2007    Priority: Medium  . Osteoporosis 01/16/2007    Priority: Medium  . Hearing loss 03/24/2020    Priority: Low  . Knee osteoarthritis 08/22/2017    Priority: Low  . Hyperlipidemia 03/16/2011    Priority: Low  . Preventative health care 03/16/2011    Priority: Low    Medications: Reconciled today in Epic __________________________________________________________  Physical Exam:  Vital Signs: Vitals:   02/23/21 0911 02/23/21 0933  BP: (!) 150/62 (!) 141/41  Pulse: 70 63  Temp: 98 F (36.7 C)   TempSrc: Oral   SpO2: 100%   Weight: 193 lb 11.2 oz (87.9 kg)   Height: 5\' 4"  (1.626 m)     Gen: Well appearing, NAD CV: RRR, no murmurs Pulm: Normal effort, CTA throughout, no wheezing Abd: Soft, NT, ND.  Ext: Warm, no edema, normal joints [place]: Alert, conversational, hard of hearing.  Mini-Mental status exam 18 out of 30.

## 2021-02-23 NOTE — Assessment & Plan Note (Addendum)
History of unprovoked VTE on indefinite anticoagulation with rivaroxaban.  May be a candidate for reducing long-term Xarelto dose to 10 mg daily, I am going to discuss with my pharmacy colleague.  We will check renal function today.  Addendum: I have decided to decrease xarelto from 20mg  daily to 10mg  daily based on new data that longterm lower dosage for non-high risk people is safe and effective.

## 2021-02-23 NOTE — Addendum Note (Signed)
Addended by: Lalla Brothers T on: 02/23/2021 02:22 PM   Modules accepted: Orders

## 2021-02-23 NOTE — Assessment & Plan Note (Signed)
Up-to-date on vaccinations.  We talked about the second COVID-19 booster for which she is eligible.

## 2021-02-24 ENCOUNTER — Encounter: Payer: Self-pay | Admitting: Student in an Organized Health Care Education/Training Program

## 2021-02-24 LAB — LIPID PANEL
Chol/HDL Ratio: 2.7 ratio (ref 0.0–4.4)
Cholesterol, Total: 172 mg/dL (ref 100–199)
HDL: 63 mg/dL (ref >39)
LDL Chol Calc (NIH): 87 mg/dL (ref 0–99)
Triglycerides: 128 mg/dL (ref 0–149)
VLDL Cholesterol Cal: 22 mg/dL (ref 5–40)

## 2021-02-24 LAB — BMP8+ANION GAP
Anion Gap: 17 mmol/L (ref 10.0–18.0)
BUN/Creatinine Ratio: 19 (ref 12–28)
BUN: 15 mg/dL (ref 8–27)
CO2: 21 mmol/L (ref 20–29)
Calcium: 9.9 mg/dL (ref 8.7–10.3)
Chloride: 97 mmol/L (ref 96–106)
Creatinine, Ser: 0.8 mg/dL (ref 0.57–1.00)
Glucose: 75 mg/dL (ref 65–99)
Potassium: 5.1 mmol/L (ref 3.5–5.2)
Sodium: 135 mmol/L (ref 134–144)
eGFR: 73 mL/min/{1.73_m2} (ref >59)

## 2021-03-11 DIAGNOSIS — H52203 Unspecified astigmatism, bilateral: Secondary | ICD-10-CM | POA: Diagnosis not present

## 2021-03-11 DIAGNOSIS — Z961 Presence of intraocular lens: Secondary | ICD-10-CM | POA: Diagnosis not present

## 2021-03-15 ENCOUNTER — Encounter: Payer: Self-pay | Admitting: *Deleted

## 2021-03-15 NOTE — Progress Notes (Unsigned)

## 2021-03-16 NOTE — Progress Notes (Unsigned)
Things That May Be Affecting Your Health:  Alcohol  Hearing loss  Pain    Depression x Home Safety  Sexual Health   Diabetes  Lack of physical activity  Stress  x Difficulty with daily activities  Loneliness  Tiredness   Drug use  Medicines  Tobacco use  x Falls  Motor Vehicle Safety  Weight   Food choices  Oral Health  Other    YOUR PERSONALIZED HEALTH PLAN : 1. Schedule your next subsequent Medicare Wellness visit in one year 2. Attend all of your regular appointments to address your medical issues 3. Complete the preventative screenings and services   Annual Wellness Visit   Medicare Covered Preventative Screenings and [place] Men and Women Who How Often Need? Date of Last Service Action  Abdominal Aortic Aneurysm Adults with AAA risk factors Once      Alcohol Misuse and Counseling All Adults Screening once a year if no alcohol misuse. Counseling up to 4 face to face sessions.     Bone Density Measurement  Adults at risk for osteoporosis Once every 2 yrs      Lipid Panel Z13.6 All adults without CV disease Once every 5 yrs       Colorectal Cancer   Stool sample or  Colonoscopy All adults 33 and older   Once every year  Every 10 years        Depression All Adults Once a year  Today   Diabetes Screening Blood glucose, post glucose load, or GTT Z13.1  All adults at risk  Pre-diabetics  Once per year  Twice per year      Diabetes  Self-Management Training All adults Diabetics 10 hrs first year; 2 hours subsequent years. Requires Copay     Glaucoma  Diabetics  Family history of glaucoma  African Americans 37 yrs +  Hispanic Americans 34 yrs + Annually - requires coppay      Hepatitis C Z72.89 or F19.20  High Risk for HCV  Born between 1945 and 1965  Annually  Once      HIV Z11.4 All adults based on risk  Annually btw ages 32 & 4 regardless of risk  Annually > 65 yrs if at increased risk      Lung Cancer Screening  Asymptomatic adults aged 11-77 with 30 pack yr history and current smoker OR quit within the last 15 yrs Annually Must have counseling and shared decision making documentation before first screen      Medical Nutrition Therapy Adults with   Diabetes  Renal disease  Kidney transplant within past 3 yrs 3 hours first year; 2 hours subsequent years     Obesity and Counseling All adults Screening once a year Counseling if BMI 30 or higher  Today   Tobacco Use Counseling Adults who use tobacco  Up to 8 visits in one year     Vaccines Z23  Hepatitis B  Influenza   Pneumonia  Adults   Once  Once every flu season  Two different vaccines separated by one year     Next Annual Wellness Visit People with Medicare Every year  Today     Services & Screenings Women Who How Often Need  Date of Last Service Action  Mammogram  Z12.31 Women over 36 One baseline ages 34-39. Annually ager 40 yrs+      Pap tests All women Annually if high risk. Every 2 yrs for normal risk women  Screening for cervical cancer with   Pap (Z01.419 nl or Z01.411abnl) &  HPV Z11.51 Women aged 16 to 27 Once every 5 yrs     Screening pelvic and breast exams All women Annually if high risk. Every 2 yrs for normal risk women     Sexually Transmitted Diseases  Chlamydia  Gonorrhea  Syphilis All at risk adults Annually for non pregnant females at increased risk         [place] Men Who How Ofter Need  Date of Last Service Action  Prostate Cancer - DRE & PSA Men over 50 Annually.  DRE might require a copay.        Sexually Transmitted Diseases  Syphilis All at risk adults Annually for men at increased risk      Health Maintenance List Health Maintenance  Topic Date Due  . INFLUENZA VACCINE  06/08/2021  . TETANUS/TDAP  02/28/2028  . DEXA SCAN  Completed  . COVID-19 Vaccine  Completed  . PNA vac Low Risk Adult  Completed  . HPV VACCINES  Aged Out

## 2021-12-15 ENCOUNTER — Other Ambulatory Visit: Payer: Self-pay | Admitting: Student in an Organized Health Care Education/Training Program

## 2022-02-23 ENCOUNTER — Other Ambulatory Visit: Payer: Self-pay | Admitting: Student in an Organized Health Care Education/Training Program

## 2022-03-06 ENCOUNTER — Other Ambulatory Visit: Payer: Self-pay | Admitting: Student in an Organized Health Care Education/Training Program

## 2022-07-23 ENCOUNTER — Other Ambulatory Visit: Payer: Self-pay

## 2022-07-23 ENCOUNTER — Emergency Department (HOSPITAL_COMMUNITY): Payer: Medicare Other

## 2022-07-23 ENCOUNTER — Encounter (HOSPITAL_COMMUNITY): Payer: Self-pay | Admitting: *Deleted

## 2022-07-23 ENCOUNTER — Emergency Department (HOSPITAL_COMMUNITY)
Admission: EM | Admit: 2022-07-23 | Discharge: 2022-07-23 | Disposition: A | Discharge status: Home / Self Care | Payer: Medicare Other | Attending: Student | Admitting: Student

## 2022-07-23 DIAGNOSIS — Z86718 Personal history of other venous thrombosis and embolism: Secondary | ICD-10-CM | POA: Insufficient documentation

## 2022-07-23 DIAGNOSIS — S32019A Unspecified fracture of first lumbar vertebra, initial encounter for closed fracture: Secondary | ICD-10-CM | POA: Diagnosis not present

## 2022-07-23 DIAGNOSIS — Z79899 Other long term (current) drug therapy: Secondary | ICD-10-CM | POA: Insufficient documentation

## 2022-07-23 DIAGNOSIS — W06XXXA Fall from bed, initial encounter: Secondary | ICD-10-CM | POA: Insufficient documentation

## 2022-07-23 DIAGNOSIS — G309 Alzheimer's disease, unspecified: Secondary | ICD-10-CM | POA: Insufficient documentation

## 2022-07-23 DIAGNOSIS — M25552 Pain in left hip: Secondary | ICD-10-CM | POA: Diagnosis not present

## 2022-07-23 DIAGNOSIS — S32010A Wedge compression fracture of first lumbar vertebra, initial encounter for closed fracture: Secondary | ICD-10-CM

## 2022-07-23 DIAGNOSIS — S3992XA Unspecified injury of lower back, initial encounter: Secondary | ICD-10-CM | POA: Diagnosis present

## 2022-07-23 DIAGNOSIS — M25551 Pain in right hip: Secondary | ICD-10-CM | POA: Insufficient documentation

## 2022-07-23 DIAGNOSIS — S0990XA Unspecified injury of head, initial encounter: Secondary | ICD-10-CM | POA: Insufficient documentation

## 2022-07-23 DIAGNOSIS — D72829 Elevated white blood cell count, unspecified: Secondary | ICD-10-CM | POA: Insufficient documentation

## 2022-07-23 DIAGNOSIS — W19XXXA Unspecified fall, initial encounter: Secondary | ICD-10-CM

## 2022-07-23 DIAGNOSIS — Z043 Encounter for examination and observation following other accident: Secondary | ICD-10-CM | POA: Diagnosis not present

## 2022-07-23 DIAGNOSIS — I10 Essential (primary) hypertension: Secondary | ICD-10-CM | POA: Diagnosis not present

## 2022-07-23 DIAGNOSIS — I4891 Unspecified atrial fibrillation: Secondary | ICD-10-CM | POA: Diagnosis not present

## 2022-07-23 DIAGNOSIS — M542 Cervicalgia: Secondary | ICD-10-CM | POA: Diagnosis not present

## 2022-07-23 DIAGNOSIS — R519 Headache, unspecified: Secondary | ICD-10-CM | POA: Diagnosis not present

## 2022-07-23 DIAGNOSIS — Z7901 Long term (current) use of anticoagulants: Secondary | ICD-10-CM | POA: Insufficient documentation

## 2022-07-23 DIAGNOSIS — I517 Cardiomegaly: Secondary | ICD-10-CM | POA: Diagnosis not present

## 2022-07-23 DIAGNOSIS — M545 Low back pain, unspecified: Secondary | ICD-10-CM | POA: Diagnosis not present

## 2022-07-23 LAB — CBC WITH DIFFERENTIAL/PLATELET
Abs Immature Granulocytes: 0.07 10*3/uL (ref 0.00–0.07)
Basophils Absolute: 0 10*3/uL (ref 0.0–0.1)
Basophils Relative: 0 %
Eosinophils Absolute: 0 10*3/uL (ref 0.0–0.5)
Eosinophils Relative: 0 %
HCT: 45.1 % (ref 36.0–46.0)
Hemoglobin: 15.1 g/dL — ABNORMAL HIGH (ref 12.0–15.0)
Immature Granulocytes: 1 %
Lymphocytes Relative: 8 %
Lymphs Abs: 1 10*3/uL (ref 0.7–4.0)
MCH: 32.1 pg (ref 26.0–34.0)
MCHC: 33.5 g/dL (ref 30.0–36.0)
MCV: 96 fL (ref 80.0–100.0)
Monocytes Absolute: 0.9 10*3/uL (ref 0.1–1.0)
Monocytes Relative: 7 %
Neutro Abs: 10.1 10*3/uL — ABNORMAL HIGH (ref 1.7–7.7)
Neutrophils Relative %: 84 %
Platelets: 314 10*3/uL (ref 150–400)
RBC: 4.7 MIL/uL (ref 3.87–5.11)
RDW: 12.6 % (ref 11.5–15.5)
WBC: 12.1 10*3/uL — ABNORMAL HIGH (ref 4.0–10.5)
nRBC: 0 % (ref 0.0–0.2)

## 2022-07-23 LAB — URINALYSIS, ROUTINE W REFLEX MICROSCOPIC
Bilirubin Urine: NEGATIVE
Glucose, UA: NEGATIVE mg/dL
Ketones, ur: NEGATIVE mg/dL
Leukocytes,Ua: NEGATIVE
Nitrite: NEGATIVE
Protein, ur: NEGATIVE mg/dL
Specific Gravity, Urine: 1.013 (ref 1.005–1.030)
pH: 5 (ref 5.0–8.0)

## 2022-07-23 LAB — COMPREHENSIVE METABOLIC PANEL
ALT: 15 U/L (ref 0–44)
AST: 25 U/L (ref 15–41)
Albumin: 3.8 g/dL (ref 3.5–5.0)
Alkaline Phosphatase: 79 U/L (ref 38–126)
Anion gap: 13 (ref 5–15)
BUN: 12 mg/dL (ref 8–23)
CO2: 21 mmol/L — ABNORMAL LOW (ref 22–32)
Calcium: 9.7 mg/dL (ref 8.9–10.3)
Chloride: 96 mmol/L — ABNORMAL LOW (ref 98–111)
Creatinine, Ser: 0.93 mg/dL (ref 0.44–1.00)
GFR, Estimated: >60 mL/min (ref >60)
Glucose, Bld: 86 mg/dL (ref 70–99)
Potassium: 4 mmol/L (ref 3.5–5.1)
Sodium: 130 mmol/L — ABNORMAL LOW (ref 135–145)
Total Bilirubin: 0.7 mg/dL (ref 0.3–1.2)
Total Protein: 7.2 g/dL (ref 6.5–8.1)

## 2022-07-23 MED ORDER — ACETAMINOPHEN-CODEINE 300-30 MG PO TABS: 1.0000 | ORAL_TABLET | Freq: Four times a day (QID) | ORAL | 0 refills | Status: DC | PRN

## 2022-07-23 MED ORDER — ACETAMINOPHEN 500 MG PO TABS
1000.0000 mg | ORAL_TABLET | Freq: Once | ORAL | Status: AC
  Administered 2022-07-23: 1000 mg via ORAL
  Filled 2022-07-23: qty 2

## 2022-07-23 MED ORDER — LIDOCAINE 5 % EX PTCH
1.0000 | MEDICATED_PATCH | CUTANEOUS | Status: DC
  Administered 2022-07-23: 1 via TRANSDERMAL
  Filled 2022-07-23: qty 1

## 2022-07-23 NOTE — ED Notes (Signed)
Patient transported to X-ray 

## 2022-07-23 NOTE — ED Triage Notes (Signed)
Patient presents to ed via [name] husband states patient rolled out of bed, did not hit her head, c/o right hip pain and lower back pain . Patient continues to ask the same questions over and over.

## 2022-07-23 NOTE — ED Provider Notes (Addendum)
Jud[facility] [name] Judith [name]   Judith [name]: [id number] Arrival date & time: 07/23/22  4097     History  Chief Complaint  Patient presents with   Judith [name]    Judith [name] is a 86 y.o. female.  HPI 86 year old female with a history of hypertension, PE, DVT on Xarelto,, factor V Leiden, hyperlipidemia, osteoporosis, Alzheimer's presents to the ER with a fall.  Level 5 caveat secondary to Alzheimer's dementia.  Per EMS and patient's husband, patient rolled out of the bed, did not hit her head.  She is complaining of right hip pain and lower back pain.  Patient repetitively asking "did I fall".  Per family has been in her normal state of health prior to this     Home Medications Prior to Admission medications   Medication Sig Start Date End Date Taking? Authorizing Judith  acetaminophen-codeine (TYLENOL #3) 300-30 MG tablet Take 1-2 tablets by mouth every 6 (six) hours as needed for moderate pain. 07/23/22  Yes Sharyn Lull A, PA-C  donepezil (ARICEPT) 5 MG tablet TAKE 1 TABLET BY MOUTH EVERYDAY AT BEDTIME Patient taking differently: Take 5 mg by mouth at bedtime. 12/15/21  Yes Axel Filler, MD  lisinopril-hydrochlorothiazide (ZESTORETIC) 10-12.5 MG tablet TAKE 1 TABLET BY MOUTH EVERY DAY 12/15/21  Yes Axel Filler, MD  pravastatin (PRAVACHOL) 20 MG tablet TAKE 1 TABLET BY MOUTH EVERY DAY 02/24/22  Yes Axel Filler, MD  XARELTO 10 MG TABS tablet TAKE 1 TABLET BY MOUTH EVERY DAY 03/08/22  Yes Axel Filler, MD      Allergies    Penicillins and Sulfonamide derivatives    Review of Systems   Review of Systems Ten systems reviewed and are negative for acute change, except as noted in the HPI.   Physical Exam Updated Vital Signs BP 110/73   Pulse 94   Temp 97.7 F (36.5 C) (Oral)   Resp 16   Ht '5\' 4"'$  (1.626 m)   Wt 87.9 kg   SpO2 97%   BMI 33.26 kg/m  Physical Exam Vitals and nursing [name] reviewed.  Constitutional:       General: She is not in acute distress.    Appearance: She is well-developed.  HENT:     Head: Normocephalic and atraumatic.  Eyes:     Conjunctiva/sclera: Conjunctivae normal.  Cardiovascular:     Rate and Rhythm: Normal rate and regular rhythm.     Heart sounds: No murmur heard. Pulmonary:     Effort: Pulmonary effort is normal. No respiratory distress.     Breath sounds: Normal breath sounds.  Abdominal:     Palpations: Abdomen is soft.     Tenderness: There is no abdominal tenderness.  Musculoskeletal:        General: No swelling.     Cervical back: Neck supple.     Comments: No tenderness over the right hip, no visible leg shortening, 2+ DP pulses, moving all 4 extremities without difficulty, equal strength in upper and lower extremities bilaterally.  Midline tenderness of the lumbar spine, no step-offs or crepitus.  Skin:    General: Skin is warm and dry.     Capillary Refill: Capillary refill takes less than 2 seconds.  Neurological:     Mental Status: She is alert.     Comments: Cranial nerves grossly intact, moving all 4 extremities, answering some questions appropriately but repetitively asking "did I fall".  Psychiatric:        Mood and Affect:  Mood normal.     ED Results / Procedures / Treatments   Labs (all labs ordered are listed, but only abnormal results are displayed) Labs Reviewed  COMPREHENSIVE METABOLIC PANEL - Abnormal; Notable for the following components:      Result Value   Sodium 130 (*)    Chloride 96 (*)    CO2 21 (*)    All other components within normal limits  CBC WITH DIFFERENTIAL/PLATELET - Abnormal; Notable for the following components:   WBC 12.1 (*)    Hemoglobin 15.1 (*)    Neutro Abs 10.1 (*)    All other components within normal limits  URINALYSIS, ROUTINE W REFLEX MICROSCOPIC - Abnormal; Notable for the following components:   Hgb urine dipstick MODERATE (*)    Bacteria, UA RARE (*)    All other components within normal  limits    EKG EKG Interpretation  Date/Time:  Friday July 23 2022 07:57:01 EDT Ventricular Rate:  90 PR Interval:    QRS Duration: 116 QT Interval:  396 QTC Calculation: 484 R Axis:   63 Text Interpretation: Atrial fibrillation with premature ventricular or aberrantly conducted complexes Low voltage QRS Incomplete right bundle branch block Nonspecific T wave abnormality Prolonged QT Abnormal ECG When compared with ECG of 23-Jul-2022 07:53, PREVIOUS ECG IS PRESENT afib vs ectopy, artifact/low voltage limits interpretation Confirmed by Wynona Dove ([id number]) on 07/23/2022 8:02:39 AM  Radiology DG Chest 1 View  Result Date: 07/23/2022 CLINICAL DATA:  Fall. EXAM: CHEST  1 VIEW COMPARISON:  November 23, 2012. FINDINGS: Mild cardiomegaly. Both lungs are clear. The visualized skeletal structures are unremarkable. IMPRESSION: No active disease. Electronically Signed   By: Marijo Conception M.D.   On: 07/23/2022 08:42   CT Lumbar Spine Wo Contrast  Result Date: 07/23/2022 CLINICAL DATA:  86 year old female status post fall from bed.  Pain. EXAM: CT LUMBAR SPINE WITHOUT CONTRAST TECHNIQUE: Multidetector CT imaging of the lumbar spine was performed without intravenous contrast administration. Multiplanar CT image reconstructions were also generated. RADIATION DOSE REDUCTION: This exam was performed according to the departmental dose-optimization program which includes automated exposure control, adjustment of the mA and/or kV according to patient size and/or use of iterative reconstruction technique. COMPARISON:  CTA chest 03/25/2006. FINDINGS: Segmentation: Normal, concordant with normal thoracic segmentation demonstrated in 2007 (except for the anterior T10-T11 level, see below. Alignment: Relatively preserved lumbar lordosis. Grade 1 anterolisthesis of L5 on S1 measures 7-8 mm. 2-3 mm of anterolisthesis also at L2-L3 and L4-L5. Vertebrae: Chronic osteopenia. Chronic anterior interbody ankylosis at  T10-T11, possibly congenital incomplete segmentation of those levels. Visible lower thoracic levels and posterior ribs appear intact. Mild L1 superior endplate compression with 10% loss of vertebral body height is associated with acute appearing fracture lucency along the left lateral body on series 4, image 47 and series 7, image 31. No retropulsion. L1 pedicles and posterior elements appear to remain intact. Other lumbar vertebrae appear intact. No other convincing acute endplate fracture. No pars fracture. Visible sacrum and SI joints appear intact. Paraspinal and other soft tissues: Moderate to large gastric hiatal hernia is new since 2007. Mild respiratory motion with otherwise negative visible lung bases. Aortoiliac calcified atherosclerosis. Normal caliber abdominal aorta. Negative visible other noncontrast abdominal viscera. Lumbar paraspinal soft tissues remain within normal limits. Disc levels: Capacious spinal canal. No significant lumbar spinal stenosis despite spine degeneration including spondylolisthesis in the lower lumbar spine with facet and posterior element hypertrophy. IMPRESSION: 1. Osteopenia with evidence of acute L1 compression  fracture. Mild, 10% loss of vertebral body height. No retropulsion or complicating features. 2. No other acute osseous abnormality identified. 3. Degenerative lumbar spondylolisthesis. But capacious spinal canal and no significant lumbar spinal stenosis. 4. Moderate to large gastric hiatal hernia is new since 2007. Aortic Atherosclerosis (ICD10-I70.0). Electronically Signed   By: Genevie Ann M.D.   On: 07/23/2022 07:54   CT Cervical Spine Wo Contrast  Result Date: 07/23/2022 CLINICAL DATA:  86 year old female status post fall from bed. Pain. EXAM: CT CERVICAL SPINE WITHOUT CONTRAST TECHNIQUE: Multidetector CT imaging of the cervical spine was performed without intravenous contrast. Multiplanar CT image reconstructions were also generated. RADIATION DOSE REDUCTION:  This exam was performed according to the departmental dose-optimization program which includes automated exposure control, adjustment of the mA and/or kV according to patient size and/or use of iterative reconstruction technique. COMPARISON:  Head CT today. FINDINGS: Alignment: Mild straightening of cervical lordosis. Mild degenerative appearing anterolisthesis at the cervicothoracic junction with associated facet arthropathy greater on the right. Bilateral posterior element alignment is within normal limits. Skull base and vertebrae: Mix of osteopenia and degenerative sclerosis at the skull base and in the cervical spine. Visualized skull base is intact. No atlanto-occipital dissociation. No acute osseous abnormality identified. Soft tissues and spinal canal: No prevertebral fluid or swelling. No visible canal hematoma. Partially retropharyngeal right carotid with bilateral calcified carotid atherosclerosis. Otherwise negative visible noncontrast neck soft tissues. Disc levels: Chronic severe C1-C2 degeneration with underlying chronic or congenital C2-C3 and C3-C4 ankylosis. Degenerative appearing developing ankylosis of C6-C7, with chronic severe disc and endplate degeneration there and at C5-C6. Facet arthropathy associated with mild spondylolisthesis at the cervicothoracic junction. Capacious appearing spinal canal above C5. Up to mild associated C5-C6 and C6-C7 spinal stenosis. Upper chest: Similar advanced upper thoracic spine degeneration with T1-T2 and developing T2-T3 ankylosis. Negative lung apices. Calcified aortic atherosclerosis. IMPRESSION: 1. No acute traumatic injury identified in the cervical spine. 2. Chronic severe cervical and upper thoracic spine degeneration superimposed on ankylosis of C2 through C4, T1-T2. Up to mild spinal stenosis at C5-C6 and C6-C7. 3. Aortic Atherosclerosis (ICD10-I70.0). Electronically Signed   By: Genevie Ann M.D.   On: 07/23/2022 07:40   CT HEAD WO CONTRAST  (5MM)  Result Date: 07/23/2022 CLINICAL DATA:  86 year old female status post fall from bed. Pain. EXAM: CT HEAD WITHOUT CONTRAST TECHNIQUE: Contiguous axial images were obtained from the base of the skull through the vertex without intravenous contrast. RADIATION DOSE REDUCTION: This exam was performed according to the departmental dose-optimization program which includes automated exposure control, adjustment of the mA and/or kV according to patient size and/or use of iterative reconstruction technique. COMPARISON:  None Available. FINDINGS: Brain: No midline shift, ventriculomegaly, mass effect, evidence of mass lesion, intracranial hemorrhage or evidence of cortically based acute infarction. Patchy and confluent bilateral cerebral white matter hypodensity. No cortical encephalomalacia identified. Cerebral volume loss appears generalized, although coronal image 34 suggest disproportionate atrophy of the mesial temporal lobes. Vascular: Calcified atherosclerosis at the skull base. No suspicious intracranial vascular hyperdensity. Skull: No fracture identified. Partially visible upper cervical spine degeneration. Sinuses/Orbits: Paranasal sinuses and mastoids are clear. Other: No orbit or scalp soft tissue injury identified. IMPRESSION: 1. No acute traumatic injury to the head identified. 2. Cerebral volume loss with possible disproportionate atrophy of the mesial temporal lobes. Bilateral cerebral white matter changes, most commonly due to chronic small vessel disease. Electronically Signed   By: Genevie Ann M.D.   On: 07/23/2022 07:37   DG  Hip Unilat W or Wo Pelvis 2-3 Views Left  Result Date: 07/23/2022 CLINICAL DATA:  86 year old female status post fall from bed. Pain. EXAM: DG HIP (WITH OR WITHOUT PELVIS) 2-3V LEFT COMPARISON:  Pelvis and right hip series the same day. FINDINGS: Bone mineralization is within normal limits for age. Left femoral head normally located. Left hip joint space is normal for age.  Visible pelvis appears intact. Proximal left femur appears intact. Incidental pelvic phleboliths. IMPRESSION: No acute fracture or dislocation identified about the left hip. Electronically Signed   By: Genevie Ann M.D.   On: 07/23/2022 07:34   DG Hip Unilat W or Wo Pelvis 2-3 Views Right  Result Date: 07/23/2022 CLINICAL DATA:  86 year old female status post fall from bed. Pain. EXAM: DG HIP (WITH OR WITHOUT PELVIS) 2-3V RIGHT COMPARISON:  None Available. FINDINGS: Femoral heads are normally located. Bone mineralization is within normal limits for age. The pelvis appears intact, with symmetric SI joints. Grossly intact proximal left femur. Asymmetric appearance of the proximal right femur on the AP pelvis view. But on dedicated AP and lateral hip views the proximal right femur appears within normal limits, intact. Pelvic phleboliths. Negative visible bowel gas. IMPRESSION: No acute fracture or dislocation identified about the right hip or pelvis. If occult hip fracture is suspected or if the patient is unable to weightbear, MRI is the preferred modality for further evaluation. Electronically Signed   By: Genevie Ann M.D.   On: 07/23/2022 07:33    Procedures Procedures    Medications Ordered in ED Medications  lidocaine (LIDODERM) 5 % 1 patch (has no administration in time range)  acetaminophen (TYLENOL) tablet 1,000 mg (1,000 mg Oral Given 07/23/22 1012)    ED Course/ Medical Decision Making/ A&P                           Medical Decision Making Amount and/or Complexity of Data Reviewed Labs: ordered. Radiology: ordered.  Risk OTC drugs. Prescription drug management.   86 year old female presenting with a fall.  Reportedly did not hit her head.  Vitals overall reassuring, afebrile.  She appears to be at her baseline with repetitive questioning.  She is complaining of low back pain and has some midline tenderness to the lumbar spine.  No other deformities noted.  Appears to be neurovascular  intact.  No gross focal neurodeficits.   Labs ordered, reviewed and interpreted by me.  CBC with a leukocytosis of 12.1, question reactive as patient is afebrile.  CMP with a sodium of 130, CO2 of 21, likely mildly dehydrated.  UA with moderate amount of hemoglobin, rare bacteria, likely in the setting of Xarelto use.  No evidence of UTI.  Imaging ordered, reviewed, interpreted by me, agree with radiology read.  Hip x-ray is negative, chest x-ray negative, CT head and cervical spine with chronic changes, CT of the lumbar spine with a acute L1 compression fracture.  She has no neurologic symptoms to suggest spinal cord compression.   EKG with atrial fibrillation.  Per discussion with husband she does not have a history of this, this appears to be new in onset.  She is rate controlled.  She is already anticoagulated.  We will refer her to cardiology for further echo and follow-up.  I discussed this with the husband at bedside who is agreeable.  Patient was given Tylenol and a Lidoderm patch.  She was ambulated, though did have some pain but was able to walk.  Will refer her to neurosurgery.  We will give Tylenol #3 and encouraged to get over-the-counter Lidoderm patch.  PDMP reviewed, appropriate. Encouraged PCP follow-up as well as she could benefit from some physical therapy.  We discussed return precautions.  Her and her husband at bedside voiced understanding and are agreeable.  Stable for discharge.  Case discussed w/ Dr. Pearline Cables who is agreeable to the above plan and disposition   Final Clinical Impression(s) / ED Diagnoses Final diagnoses:  Fall, initial encounter  Closed compression fracture of L1 vertebra, initial encounter [facility])  Atrial fibrillation, unspecified type [facility])    Rx / DC Orders ED Discharge Orders          Ordered    acetaminophen-codeine (TYLENOL #3) 300-30 MG tablet  Every 6 hours PRN        07/23/22 1108                 Garald Balding, PA-C 07/23/22  1130    Wynona Dove A, DO 07/23/22 1146

## 2022-07-23 NOTE — Discharge Instructions (Addendum)
Your workup today showed that you have a fracture in your lumbar spine at L1.  These are usually self-limiting and end up healing on their own, however I do recommend that you follow-up with neurosurgery.  I provided their contact information in your discharge paperwork, please call the phone number and schedule an appointment.  You may take Tylenol 3, take at night and be careful when getting up as this can make you dizzy.  You may also buy over-the-counter Lidoderm patches and apply to the area of pain.  Your EKG today showed that you have new onset atrial fibrillation, which is a irregular rate of your heart.  Please make sure to call the phone number provided for cardiology and schedule an appointment for further evaluation for this.  Please follow-up with your primary care doctor as you could benefit from some physical therapy.  Return to the ER for any new or worsening symptoms.

## 2022-07-23 NOTE — ED Notes (Signed)
Pt ambulated to restroom with 2 assist.

## 2022-07-23 NOTE — ED Notes (Signed)
Patient transported to CT and xray 

## 2022-07-26 ENCOUNTER — Telehealth: Payer: Self-pay

## 2022-07-26 NOTE — Patient Outreach (Signed)
  Care Coordination [facility] Note Transition Care Management Unsuccessful Follow-up Telephone Call  Date of discharge and from where:  [facility] 07/23/22  Attempts:  1st Attempt  Reason for unsuccessful TCM follow-up call:  No answer/busy  Johnney Killian, RN, BSN, CCM Care Management Coordinator [facility] Phone: [phone]: [phone]

## 2022-07-27 ENCOUNTER — Telehealth: Payer: Self-pay

## 2022-07-27 NOTE — Patient Outreach (Signed)
error 

## 2022-07-27 NOTE — Patient Outreach (Signed)
  Care Coordination TOC Note Transition Care Management Unsuccessful Follow-up Telephone Call  Date of discharge and from where:  [name] 07/23/22  Attempts:  2nd Attempt  Reason for unsuccessful TCM follow-up call:  No answer/busy

## 2022-09-24 ENCOUNTER — Emergency Department (HOSPITAL_COMMUNITY): Payer: Medicare Other

## 2022-09-24 ENCOUNTER — Inpatient Hospital Stay (HOSPITAL_COMMUNITY)
Admission: EM | Admit: 2022-09-24 | Discharge: 2022-09-29 | DRG: 640 | Disposition: A | Discharge status: Hospice | Payer: Medicare Other | Attending: Infectious Diseases | Admitting: Infectious Diseases

## 2022-09-24 ENCOUNTER — Other Ambulatory Visit: Payer: Self-pay

## 2022-09-24 ENCOUNTER — Encounter (HOSPITAL_COMMUNITY): Payer: Self-pay

## 2022-09-24 DIAGNOSIS — Z803 Family history of malignant neoplasm of breast: Secondary | ICD-10-CM

## 2022-09-24 DIAGNOSIS — Z87730 Personal history of (corrected) cleft lip and palate: Secondary | ICD-10-CM

## 2022-09-24 DIAGNOSIS — Z6827 Body mass index (BMI) 27.0-27.9, adult: Secondary | ICD-10-CM

## 2022-09-24 DIAGNOSIS — Z882 Allergy status to sulfonamides status: Secondary | ICD-10-CM

## 2022-09-24 DIAGNOSIS — R531 Weakness: Secondary | ICD-10-CM | POA: Diagnosis not present

## 2022-09-24 DIAGNOSIS — M4856XD Collapsed vertebra, not elsewhere classified, lumbar region, subsequent encounter for fracture with routine healing: Secondary | ICD-10-CM | POA: Diagnosis present

## 2022-09-24 DIAGNOSIS — R569 Unspecified convulsions: Secondary | ICD-10-CM | POA: Diagnosis present

## 2022-09-24 DIAGNOSIS — F0280 Dementia in other diseases classified elsewhere without behavioral disturbance: Secondary | ICD-10-CM | POA: Diagnosis present

## 2022-09-24 DIAGNOSIS — R0689 Other abnormalities of breathing: Secondary | ICD-10-CM | POA: Diagnosis not present

## 2022-09-24 DIAGNOSIS — D6852 Prothrombin gene mutation: Secondary | ICD-10-CM | POA: Diagnosis present

## 2022-09-24 DIAGNOSIS — I10 Essential (primary) hypertension: Secondary | ICD-10-CM | POA: Diagnosis present

## 2022-09-24 DIAGNOSIS — N179 Acute kidney failure, unspecified: Secondary | ICD-10-CM | POA: Diagnosis present

## 2022-09-24 DIAGNOSIS — Z86711 Personal history of pulmonary embolism: Secondary | ICD-10-CM

## 2022-09-24 DIAGNOSIS — E871 Hypo-osmolality and hyponatremia: Secondary | ICD-10-CM | POA: Diagnosis not present

## 2022-09-24 DIAGNOSIS — E785 Hyperlipidemia, unspecified: Secondary | ICD-10-CM | POA: Diagnosis present

## 2022-09-24 DIAGNOSIS — E860 Dehydration: Secondary | ICD-10-CM | POA: Diagnosis present

## 2022-09-24 DIAGNOSIS — M810 Age-related osteoporosis without current pathological fracture: Secondary | ICD-10-CM | POA: Diagnosis present

## 2022-09-24 DIAGNOSIS — Z880 Allergy status to penicillin: Secondary | ICD-10-CM

## 2022-09-24 DIAGNOSIS — R4781 Slurred speech: Secondary | ICD-10-CM | POA: Diagnosis not present

## 2022-09-24 DIAGNOSIS — R638 Other symptoms and signs concerning food and fluid intake: Secondary | ICD-10-CM | POA: Diagnosis not present

## 2022-09-24 DIAGNOSIS — R630 Anorexia: Secondary | ICD-10-CM | POA: Diagnosis present

## 2022-09-24 DIAGNOSIS — G309 Alzheimer's disease, unspecified: Secondary | ICD-10-CM | POA: Diagnosis present

## 2022-09-24 DIAGNOSIS — R32 Unspecified urinary incontinence: Secondary | ICD-10-CM | POA: Diagnosis present

## 2022-09-24 DIAGNOSIS — E861 Hypovolemia: Secondary | ICD-10-CM | POA: Diagnosis present

## 2022-09-24 DIAGNOSIS — I482 Chronic atrial fibrillation, unspecified: Secondary | ICD-10-CM | POA: Insufficient documentation

## 2022-09-24 DIAGNOSIS — W19XXXA Unspecified fall, initial encounter: Secondary | ICD-10-CM | POA: Diagnosis present

## 2022-09-24 DIAGNOSIS — Z515 Encounter for palliative care: Secondary | ICD-10-CM | POA: Diagnosis not present

## 2022-09-24 DIAGNOSIS — Z789 Other specified health status: Secondary | ICD-10-CM | POA: Diagnosis not present

## 2022-09-24 DIAGNOSIS — Z8249 Family history of ischemic heart disease and other diseases of the circulatory system: Secondary | ICD-10-CM

## 2022-09-24 DIAGNOSIS — F02C11 Dementia in other diseases classified elsewhere, severe, with agitation: Secondary | ICD-10-CM | POA: Diagnosis present

## 2022-09-24 DIAGNOSIS — Z7901 Long term (current) use of anticoagulants: Secondary | ICD-10-CM | POA: Diagnosis not present

## 2022-09-24 DIAGNOSIS — Z8542 Personal history of malignant neoplasm of other parts of uterus: Secondary | ICD-10-CM

## 2022-09-24 DIAGNOSIS — E872 Acidosis, unspecified: Secondary | ICD-10-CM | POA: Diagnosis present

## 2022-09-24 DIAGNOSIS — L89156 Pressure-induced deep tissue damage of sacral region: Secondary | ICD-10-CM | POA: Diagnosis present

## 2022-09-24 DIAGNOSIS — E876 Hypokalemia: Secondary | ICD-10-CM | POA: Diagnosis present

## 2022-09-24 DIAGNOSIS — Z66 Do not resuscitate: Secondary | ICD-10-CM | POA: Diagnosis present

## 2022-09-24 DIAGNOSIS — R52 Pain, unspecified: Secondary | ICD-10-CM | POA: Diagnosis not present

## 2022-09-24 DIAGNOSIS — R4182 Altered mental status, unspecified: Secondary | ICD-10-CM | POA: Diagnosis not present

## 2022-09-24 DIAGNOSIS — R54 Age-related physical debility: Secondary | ICD-10-CM | POA: Diagnosis present

## 2022-09-24 DIAGNOSIS — I499 Cardiac arrhythmia, unspecified: Secondary | ICD-10-CM | POA: Diagnosis not present

## 2022-09-24 DIAGNOSIS — R1310 Dysphagia, unspecified: Secondary | ICD-10-CM | POA: Diagnosis present

## 2022-09-24 DIAGNOSIS — E875 Hyperkalemia: Secondary | ICD-10-CM | POA: Diagnosis not present

## 2022-09-24 DIAGNOSIS — D6851 Activated protein C resistance: Secondary | ICD-10-CM | POA: Diagnosis not present

## 2022-09-24 DIAGNOSIS — E43 Unspecified severe protein-calorie malnutrition: Secondary | ICD-10-CM | POA: Diagnosis present

## 2022-09-24 DIAGNOSIS — I480 Paroxysmal atrial fibrillation: Secondary | ICD-10-CM | POA: Diagnosis present

## 2022-09-24 DIAGNOSIS — Z79899 Other long term (current) drug therapy: Secondary | ICD-10-CM | POA: Diagnosis not present

## 2022-09-24 DIAGNOSIS — R627 Adult failure to thrive: Secondary | ICD-10-CM | POA: Diagnosis present

## 2022-09-24 DIAGNOSIS — R000 Tachycardia, unspecified: Secondary | ICD-10-CM | POA: Diagnosis not present

## 2022-09-24 DIAGNOSIS — Z86718 Personal history of other venous thrombosis and embolism: Secondary | ICD-10-CM

## 2022-09-24 DIAGNOSIS — Z820 Family history of epilepsy and other diseases of the nervous system: Secondary | ICD-10-CM

## 2022-09-24 DIAGNOSIS — Z862 Personal history of diseases of the blood and blood-forming organs and certain disorders involving the immune mechanism: Secondary | ICD-10-CM | POA: Diagnosis not present

## 2022-09-24 DIAGNOSIS — I69818 Other symptoms and signs involving cognitive functions following other cerebrovascular disease: Secondary | ICD-10-CM | POA: Diagnosis not present

## 2022-09-24 DIAGNOSIS — Z7401 Bed confinement status: Secondary | ICD-10-CM | POA: Diagnosis not present

## 2022-09-24 DIAGNOSIS — I4891 Unspecified atrial fibrillation: Secondary | ICD-10-CM | POA: Diagnosis not present

## 2022-09-24 DIAGNOSIS — Z9181 History of falling: Secondary | ICD-10-CM

## 2022-09-24 DIAGNOSIS — R451 Restlessness and agitation: Secondary | ICD-10-CM | POA: Diagnosis not present

## 2022-09-24 DIAGNOSIS — Z7189 Other specified counseling: Secondary | ICD-10-CM | POA: Diagnosis not present

## 2022-09-24 DIAGNOSIS — K449 Diaphragmatic hernia without obstruction or gangrene: Secondary | ICD-10-CM | POA: Diagnosis not present

## 2022-09-24 LAB — COMPREHENSIVE METABOLIC PANEL
ALT: 32 U/L (ref 0–44)
AST: 101 U/L — ABNORMAL HIGH (ref 15–41)
Albumin: 3.3 g/dL — ABNORMAL LOW (ref 3.5–5.0)
Alkaline Phosphatase: 62 U/L (ref 38–126)
Anion gap: 26 — ABNORMAL HIGH (ref 5–15)
BUN: 15 mg/dL (ref 8–23)
CO2: 19 mmol/L — ABNORMAL LOW (ref 22–32)
Calcium: 8.5 mg/dL — ABNORMAL LOW (ref 8.9–10.3)
Chloride: 75 mmol/L — ABNORMAL LOW (ref 98–111)
Creatinine, Ser: 1.97 mg/dL — ABNORMAL HIGH (ref 0.44–1.00)
GFR, Estimated: 25 mL/min — ABNORMAL LOW (ref >60)
Glucose, Bld: 92 mg/dL (ref 70–99)
Potassium: 3.2 mmol/L — ABNORMAL LOW (ref 3.5–5.1)
Sodium: 120 mmol/L — ABNORMAL LOW (ref 135–145)
Total Bilirubin: 1.5 mg/dL — ABNORMAL HIGH (ref 0.3–1.2)
Total Protein: 6.6 g/dL (ref 6.5–8.1)

## 2022-09-24 LAB — CBC WITH DIFFERENTIAL/PLATELET
Abs Immature Granulocytes: 0.05 10*3/uL (ref 0.00–0.07)
Basophils Absolute: 0 10*3/uL (ref 0.0–0.1)
Basophils Relative: 0 %
Eosinophils Absolute: 0 10*3/uL (ref 0.0–0.5)
Eosinophils Relative: 0 %
HCT: 39.8 % (ref 36.0–46.0)
Hemoglobin: 13.8 g/dL (ref 12.0–15.0)
Immature Granulocytes: 1 %
Lymphocytes Relative: 6 %
Lymphs Abs: 0.6 10*3/uL — ABNORMAL LOW (ref 0.7–4.0)
MCH: 31.3 pg (ref 26.0–34.0)
MCHC: 34.7 g/dL (ref 30.0–36.0)
MCV: 90.2 fL (ref 80.0–100.0)
Monocytes Absolute: 0.8 10*3/uL (ref 0.1–1.0)
Monocytes Relative: 8 %
Neutro Abs: 9.2 10*3/uL — ABNORMAL HIGH (ref 1.7–7.7)
Neutrophils Relative %: 85 %
Platelets: 303 10*3/uL (ref 150–400)
RBC: 4.41 MIL/uL (ref 3.87–5.11)
RDW: 13.7 % (ref 11.5–15.5)
WBC: 10.6 10*3/uL — ABNORMAL HIGH (ref 4.0–10.5)
nRBC: 0 % (ref 0.0–0.2)

## 2022-09-24 LAB — MAGNESIUM: Magnesium: 1 mg/dL — ABNORMAL LOW (ref 1.7–2.4)

## 2022-09-24 LAB — BETA-HYDROXYBUTYRIC ACID: Beta-Hydroxybutyric Acid: 1.43 mmol/L — ABNORMAL HIGH (ref 0.05–0.27)

## 2022-09-24 LAB — LACTIC ACID, PLASMA: Lactic Acid, Venous: 2 mmol/L (ref 0.5–1.9)

## 2022-09-24 LAB — PHOSPHORUS: Phosphorus: 3.3 mg/dL (ref 2.5–4.6)

## 2022-09-24 MED ORDER — MAGNESIUM SULFATE 2 GM/50ML IV SOLN
2.0000 g | Freq: Once | INTRAVENOUS | Status: AC
  Administered 2022-09-25: 2 g via INTRAVENOUS
  Filled 2022-09-24: qty 50

## 2022-09-24 MED ORDER — ACETAMINOPHEN 650 MG RE SUPP: 650.0000 mg | Freq: Four times a day (QID) | RECTAL | Status: DC | PRN

## 2022-09-24 MED ORDER — RIVAROXABAN 10 MG PO TABS
10.0000 mg | ORAL_TABLET | Freq: Every day | ORAL | Status: DC
  Administered 2022-09-25 – 2022-09-28 (×4): 10 mg via ORAL
  Filled 2022-09-24 (×4): qty 1

## 2022-09-24 MED ORDER — SODIUM CHLORIDE 0.9 % IV SOLN: INTRAVENOUS | Status: DC

## 2022-09-24 MED ORDER — DONEPEZIL HCL 5 MG PO TABS
5.0000 mg | ORAL_TABLET | Freq: Every day | ORAL | Status: DC
  Administered 2022-09-24 – 2022-09-28 (×5): 5 mg via ORAL
  Filled 2022-09-24 (×6): qty 1

## 2022-09-24 MED ORDER — SENNOSIDES-DOCUSATE SODIUM 8.6-50 MG PO TABS: 1.0000 | ORAL_TABLET | Freq: Every evening | ORAL | Status: DC | PRN

## 2022-09-24 MED ORDER — HEPARIN SODIUM (PORCINE) 5000 UNIT/ML IJ SOLN: 5000.0000 [IU] | Freq: Three times a day (TID) | INTRAMUSCULAR | Status: DC

## 2022-09-24 MED ORDER — LACTATED RINGERS IV BOLUS
1000.0000 mL | Freq: Once | INTRAVENOUS | Status: AC
  Administered 2022-09-24: 1000 mL via INTRAVENOUS

## 2022-09-24 MED ORDER — PRAVASTATIN SODIUM 40 MG PO TABS
20.0000 mg | ORAL_TABLET | Freq: Every day | ORAL | Status: DC
  Administered 2022-09-25 – 2022-09-28 (×4): 20 mg via ORAL
  Filled 2022-09-24 (×3): qty 1
  Filled 2022-09-24: qty 2

## 2022-09-24 MED ORDER — ONDANSETRON HCL 4 MG PO TABS: 4.0000 mg | ORAL_TABLET | Freq: Four times a day (QID) | ORAL | Status: DC | PRN

## 2022-09-24 MED ORDER — POTASSIUM CHLORIDE 10 MEQ/100ML IV SOLN
10.0000 meq | INTRAVENOUS | Status: AC
  Administered 2022-09-24 – 2022-09-25 (×2): 10 meq via INTRAVENOUS
  Filled 2022-09-24 (×2): qty 100

## 2022-09-24 MED ORDER — ACETAMINOPHEN 325 MG PO TABS
650.0000 mg | ORAL_TABLET | Freq: Four times a day (QID) | ORAL | Status: DC | PRN
  Administered 2022-09-25 – 2022-09-28 (×5): 650 mg via ORAL
  Filled 2022-09-24 (×7): qty 2

## 2022-09-24 MED ORDER — ONDANSETRON HCL 4 MG/2ML IJ SOLN: 4.0000 mg | Freq: Four times a day (QID) | INTRAMUSCULAR | Status: DC | PRN

## 2022-09-24 MED ORDER — METOPROLOL TARTRATE 25 MG PO TABS
25.0000 mg | ORAL_TABLET | Freq: Two times a day (BID) | ORAL | Status: DC
  Administered 2022-09-24 – 2022-09-28 (×7): 25 mg via ORAL
  Filled 2022-09-24 (×7): qty 1

## 2022-09-24 NOTE — ED Notes (Signed)
MD crosley notified of critical lactic 2.0

## 2022-09-24 NOTE — H&P (Incomplete)
PCP:   Axel Filler, MD   Chief Complaint:  Transient confusion  HPI: This 86 year old female with severe Alzheimer's dementia, atrial fibrillation, factor V Leyden deficiency, PE and DVTs..  Per report she is alert and oriented x1 at baseline.  She lives at home with her husband.  Today she had a brief episode of confusion.  It lasted a few seconds.  They state that she went on to have slurred speech and another brief episode of confusion.  For the past 1 to 2 weeks the patient has had poor appetite with poor p.o. intake.  Her husband brought her to the ER.  In the ER patient found to be in A-fib with RVR with heart rate in 130s to 140s.  Her sodium 120, her creatinine 1.97.  The patient has a history of atrial fibrillation.  The hospitalist was asked admit.   Review of Systems:  Unable to obtain secondary Alzheimer's dementia  Past Medical History: Past Medical History:  Diagnosis Date   Adenocarcinoma of uterus ([place]) 85   DVT (deep vein thrombosis) in pregnancy    Both lower extremities   Heterozygous factor V Leiden mutation ([place])    Hyperlipidemia    Hypertension    Osteoporosis 06/2014   T score -2.6 left forearm stable from prior DEXA off Fosamax   Pulmonary embolus ([place]) 2007   and DVT, on chronic anti-coagulation   Stress incontinence, female    Past Surgical History:  Procedure Laterality Date   CLEFT PALATE REPAIR     COLONOSCOPY  09/22/2011   Procedure: COLONOSCOPY;  Surgeon: Juanita Craver, MD;  Location: [facility];  Service: Endoscopy;  Laterality: N/A;   HAMMER TOE SURGERY  1990-91   3 toes each foot   nasal septum repair     TUBAL LIGATION     vaginectomy      Medications: Prior to Admission medications   Medication Sig Start Date End Date Taking? Authorizing Provider  acetaminophen-codeine (TYLENOL #3) 300-30 MG tablet Take 1-2 tablets by mouth every 6 (six) hours as needed for moderate pain. 07/23/22   Garald Balding, PA-C  donepezil (ARICEPT)  5 MG tablet TAKE 1 TABLET BY MOUTH EVERYDAY AT BEDTIME Patient taking differently: Take 5 mg by mouth at bedtime. 12/15/21   Axel Filler, MD  lisinopril-hydrochlorothiazide (ZESTORETIC) 10-12.5 MG tablet TAKE 1 TABLET BY MOUTH EVERY DAY 12/15/21   Axel Filler, MD  pravastatin (PRAVACHOL) 20 MG tablet TAKE 1 TABLET BY MOUTH EVERY DAY 02/24/22   Axel Filler, MD  XARELTO 10 MG TABS tablet TAKE 1 TABLET BY MOUTH EVERY DAY 03/08/22   Axel Filler, MD    Allergies:   Allergies  Allergen Reactions   Penicillins     REACTION: rash   Sulfonamide Derivatives     REACTION: Unknown reaction    Social History:  reports that she has never smoked. She has never used smokeless tobacco. She reports that she does not drink alcohol and does not use drugs.  Family History: Family History  Problem Relation Age of Onset   Heart disease Sister    Atrial fibrillation Sister    Hypertension Sister    Breast cancer Maternal Aunt        Age unknown   Alzheimer's disease Mother    Alzheimer's disease Father     Physical Exam: Vitals:   09/24/22 1900 09/24/22 2000 09/24/22 2115 09/24/22 2205  BP: 113/63 130/86  93/61  Pulse: (!) 150  Marland Kitchen)  103 (!) 102  Resp: (!) 21 (!) '21 18 12  '$ Temp: 97.9 F (36.6 C)     TempSrc: Oral     SpO2: 100%  93% 98%    General:  Alert and oriented times three.  Demented female Eyes: PERRLA, pink conjunctiva, no scleral icterus ENT: Very dry oral mucosa, neck supple, no thyromegaly Lungs: clear to ascultation, no wheeze, no crackles, no use of accessory muscles Cardiovascular: regular rate and rhythm, no regurgitation, no gallops, no murmurs. No carotid bruits, no JVD Abdomen: soft, positive BS, non-tender, non-distended, no organomegaly, not an acute abdomen GU: not examined Neuro: Difficult to assess due to patient's dementia but appears grossly intact Musculoskeletal: strength 5/5 all extremities, no clubbing, cyanosis or  edema Skin: no rash, no subcutaneous crepitation, no decubitus Psych: Demented female   Labs on Admission:  Recent Labs    09/24/22 1850  NA 120*  K 3.2*  CL 75*  CO2 19*  GLUCOSE 92  BUN 15  CREATININE 1.97*  CALCIUM 8.5*   Recent Labs    09/24/22 1850  AST 101*  ALT 32  ALKPHOS 62  BILITOT 1.5*  PROT 6.6  ALBUMIN 3.3*   No results for input(s): "LIPASE", "AMYLASE" in the last 72 hours. Recent Labs    09/24/22 1850  WBC 10.6*  NEUTROABS 9.2*  HGB 13.8  HCT 39.8  MCV 90.2  PLT 303    Radiological Exams on Admission: DG Chest 2 View  Result Date: 09/24/2022 CLINICAL DATA:  Altered mental status, tachycardia EXAM: CHEST - 2 VIEW COMPARISON:  07/23/2022 FINDINGS: Lungs are clear.  No pleural effusion or pneumothorax. The heart is normal in size. Moderate hiatal hernia. Mild degenerative changes of the visualized thoracolumbar spine. IMPRESSION: Normal chest radiographs. Moderate hiatal hernia. Electronically Signed   By: Julian Hy M.D.   On: 09/24/2022 20:03   CT HEAD WO CONTRAST  Result Date: 09/24/2022 CLINICAL DATA:  TIA.  Mental status change. EXAM: CT HEAD WITHOUT CONTRAST TECHNIQUE: Contiguous axial images were obtained from the base of the skull through the vertex without intravenous contrast. RADIATION DOSE REDUCTION: This exam was performed according to the departmental dose-optimization program which includes automated exposure control, adjustment of the mA and/or kV according to patient size and/or use of iterative reconstruction technique. COMPARISON:  07/23/2022 FINDINGS: Brain: No acute infarct, hemorrhage, or mass lesion is present. Moderate atrophy and white matter changes are stable. Marked temporal lobe atrophy is noted in particular, similar the prior exam. The ventricles are proportionate to the degree of atrophy. Basal ganglia are within normal limits. No significant extraaxial fluid collection is present. Brainstem and cerebellum are  unremarkable. Vascular: Atherosclerotic calcifications are present within the cavernous internal carotid arteries bilaterally. No hyperdense vessel is present. Skull: Calvarium is intact. No focal lytic or blastic lesions are present. No significant extracranial soft tissue lesion is present. Sinuses/Orbits: Bilateral lens replacements are noted. Globes and orbits are otherwise unremarkable. The paranasal sinuses and mastoid air cells are clear. IMPRESSION: 1. No acute intracranial abnormality or significant interval change. 2. Stable atrophy and white matter disease. This likely reflects the sequela of chronic microvascular ischemia. Electronically Signed   By: San Morelle M.D.   On: 09/24/2022 19:47    Assessment/Plan Present on Admission: Atrial fibrillation with RVR -Admit to med telemetry -Lopressor 25 mg p.o. twice daily started -Continue Xarelto -Cardiology consult per a.m. team. -A-fib likely precipitated by patient's dehydration   Hyponatremia -Multifactorial etiology, poor p.o. intake, HCTZ, -TSH, urine  sodium, urine and plasma osmolality ordered -For now gentle IV fluid with normal saline   Hypokalemia, mild -20 mg p.o. potassium ordered. -BMP in a.m.   AKI (acute kidney injury)/dehydration -Baseline creatinine normal 0.93, done 07/23/2022 -NS IV fluids started.  BUN normal but patient clinically dehydrated -Hold lisinopril, HCTZ -BMP in a.m..  Strict I's and O's -Avoid nephrotoxic medication.  Transient mental status changes -Spontaneous resolved -CT head negative   Anorexia -Nutrition consult, supplements ordered   Essential hypertension -Hypertension twice daily   Factor V Leiden, prothrombin gene mutation (HCC)/history of DVT, PE -Xarelto continued   Hyperlipidemia -Pravastatin resumed   Alzheimer's dementia ([place]) -Severe, Aricept resumed   Katoya Amato 09/24/2022, 10:51 PM

## 2022-09-24 NOTE — ED Provider Notes (Signed)
[facility] EMERGENCY DEPARTMENT Provider Note   CSN: [id number] Arrival date & time: 09/24/22  1853     History  Chief Complaint  Patient presents with   Weakness    From home via EMS with c/o generalized weakness and poor PO intake x 4 days. Husband reports 1 episode of slurred speech lasting 10 seconds at 0600 that resolved. Upon EMS arrival pt HR 160-180. Alert and oriented to self only (pt's baseline per husband).     Judith [name] is a 86 y.o. female.  Patient is an 86 year old female with a past medical history of dementia ANO x1 at baseline, A-fib, factor V on Xarelto presenting to the emergency department with decreased appetite and an episode of confusion today.  Patient lives at home with her boyfriend who states that a few months ago she had a fall and had a lumbar compression fracture.  He states that she has been having pain and has not been as mobile since then.  He states over the last 2 weeks she has declined more and has had decreased appetite.  He states that she has hardly ate anything in the last 2 days.  He states that this afternoon she had an episode where he was unable to understand her speech and that it sounded slurred and she also appeared confused saying that she did not know how to walk or get up.  He states that he then called 911.  He states that she is now back to her baseline.  He denies any fevers, states she has not been complaining of any pain and denies any nausea, vomiting, diarrhea or constipation.  He states that she has had increased urinary frequency.  On EMS arrival, the patient was noted to be tachycardic with a rate in the 160s to the 180s with an irregular rhythm and was started on fluids.  The history is provided by the spouse and the EMS personnel.  Weakness      Home Medications Prior to Admission medications   Medication Sig Start Date End Date Taking? Authorizing Provider  acetaminophen-codeine (TYLENOL #3) 300-30 MG  tablet Take 1-2 tablets by mouth every 6 (six) hours as needed for moderate pain. 07/23/22   Garald Balding, PA-C  donepezil (ARICEPT) 5 MG tablet TAKE 1 TABLET BY MOUTH EVERYDAY AT BEDTIME Patient taking differently: Take 5 mg by mouth at bedtime. 12/15/21   Axel Filler, MD  lisinopril-hydrochlorothiazide (ZESTORETIC) 10-12.5 MG tablet TAKE 1 TABLET BY MOUTH EVERY DAY 12/15/21   Axel Filler, MD  pravastatin (PRAVACHOL) 20 MG tablet TAKE 1 TABLET BY MOUTH EVERY DAY 02/24/22   Axel Filler, MD  XARELTO 10 MG TABS tablet TAKE 1 TABLET BY MOUTH EVERY DAY 03/08/22   Axel Filler, MD      Allergies    Penicillins and Sulfonamide derivatives    Review of Systems   Review of Systems  Neurological:  Positive for weakness.    Physical Exam Updated Vital Signs BP 93/61   Pulse (!) 102   Temp 97.9 F (36.6 C) (Oral)   Resp 12   SpO2 98%  Physical Exam Vitals and nursing note reviewed.  Constitutional:      General: She is not in acute distress.    Appearance: Normal appearance.  HENT:     Head: Normocephalic and atraumatic.     Nose: Nose normal.     Mouth/Throat:     Mouth: Mucous membranes are dry.  Pharynx: Oropharynx is clear.  Eyes:     Extraocular Movements: Extraocular movements intact.     Conjunctiva/sclera: Conjunctivae normal.     Pupils: Pupils are equal, round, and reactive to light.  Cardiovascular:     Rate and Rhythm: Tachycardia present. Rhythm irregular.     Pulses: Normal pulses.     Heart sounds: Normal heart sounds.  Pulmonary:     Effort: Pulmonary effort is normal.     Breath sounds: Normal breath sounds.  Abdominal:     General: Abdomen is flat.     Palpations: Abdomen is soft.     Tenderness: There is no abdominal tenderness.  Musculoskeletal:        General: Normal range of motion.     Cervical back: Normal range of motion and neck supple.     Right lower leg: No edema.     Left lower leg: No edema.  Skin:     General: Skin is warm and dry.  Neurological:     Mental Status: She is alert. Mental status is at baseline.     Cranial Nerves: No cranial nerve deficit.     Sensory: No sensory deficit.     Motor: No weakness.     Comments: Oriented to person only  Psychiatric:        Mood and Affect: Mood normal.        Behavior: Behavior normal.     ED Results / Procedures / Treatments   Labs (all labs ordered are listed, but only abnormal results are displayed) Labs Reviewed  COMPREHENSIVE METABOLIC PANEL - Abnormal; Notable for the following components:      Result Value   Sodium 120 (*)    Potassium 3.2 (*)    Chloride 75 (*)    CO2 19 (*)    Creatinine, Ser 1.97 (*)    Calcium 8.5 (*)    Albumin 3.3 (*)    AST 101 (*)    Total Bilirubin 1.5 (*)    GFR, Estimated 25 (*)    Anion gap 26 (*)    All other components within normal limits  CBC WITH DIFFERENTIAL/PLATELET - Abnormal; Notable for the following components:   WBC 10.6 (*)    Neutro Abs 9.2 (*)    Lymphs Abs 0.6 (*)    All other components within normal limits  URINALYSIS, ROUTINE W REFLEX MICROSCOPIC  LACTIC ACID, PLASMA  LACTIC ACID, PLASMA  BETA-HYDROXYBUTYRIC ACID    EKG EKG Interpretation  Date/Time:  Friday September 24 2022 19:07:11 EST Ventricular Rate:  132 PR Interval:    QRS Duration: 127 QT Interval:  335 QTC Calculation: 497 R Axis:   77 Text Interpretation: Atrial fibrillation with rapid ventricular response Right bundle branch block No significant change since last tracing Confirmed by Leanord Asal ([id number]) on 09/24/2022 7:14:47 PM  Radiology DG Chest 2 View  Result Date: 09/24/2022 CLINICAL DATA:  Altered mental status, tachycardia EXAM: CHEST - 2 VIEW COMPARISON:  07/23/2022 FINDINGS: Lungs are clear.  No pleural effusion or pneumothorax. The heart is normal in size. Moderate hiatal hernia. Mild degenerative changes of the visualized thoracolumbar spine. IMPRESSION: Normal chest radiographs.  Moderate hiatal hernia. Electronically Signed   By: Julian Hy M.D.   On: 09/24/2022 20:03   CT HEAD WO CONTRAST  Result Date: 09/24/2022 CLINICAL DATA:  TIA.  Mental status change. EXAM: CT HEAD WITHOUT CONTRAST TECHNIQUE: Contiguous axial images were obtained from the base of the skull through the vertex without  intravenous contrast. RADIATION DOSE REDUCTION: This exam was performed according to the departmental dose-optimization program which includes automated exposure control, adjustment of the mA and/or kV according to patient size and/or use of iterative reconstruction technique. COMPARISON:  07/23/2022 FINDINGS: Brain: No acute infarct, hemorrhage, or mass lesion is present. Moderate atrophy and white matter changes are stable. Marked temporal lobe atrophy is noted in particular, similar the prior exam. The ventricles are proportionate to the degree of atrophy. Basal ganglia are within normal limits. No significant extraaxial fluid collection is present. Brainstem and cerebellum are unremarkable. Vascular: Atherosclerotic calcifications are present within the cavernous internal carotid arteries bilaterally. No hyperdense vessel is present. Skull: Calvarium is intact. No focal lytic or blastic lesions are present. No significant extracranial soft tissue lesion is present. Sinuses/Orbits: Bilateral lens replacements are noted. Globes and orbits are otherwise unremarkable. The paranasal sinuses and mastoid air cells are clear. IMPRESSION: 1. No acute intracranial abnormality or significant interval change. 2. Stable atrophy and white matter disease. This likely reflects the sequela of chronic microvascular ischemia. Electronically Signed   By: San Morelle M.D.   On: 09/24/2022 19:47    Procedures Procedures    Medications Ordered in ED Medications  potassium chloride 10 mEq in 100 mL IVPB (has no administration in time range)  lactated ringers bolus 1,000 mL (has no administration  in time range)  lactated ringers bolus 1,000 mL (1,000 mLs Intravenous New Bag/Given 09/24/22 2042)    ED Course/ Medical Decision Making/ A&P Clinical Course as of 09/24/22 2237  Fri Sep 24, 2022  2220 On reassessment, the patient's labs are significant for hyponatremia with a sodium of 120, likely hypovolemic hyponatremia as the patient appears dry on exam as well as metabolic acidosis with an anion gap of 26.  Lactate and BHB will be performed to evaluate for cause of her metabolic acidosis.  Urine is pending at this time.  The patient will require admission for further management of her dehydration. [VK]    Clinical Course User Index [VK] Kemper Durie, DO                           Medical Decision Making This patient presents to the ED with chief complaint(s) of slurred speech, decreased appetite with pertinent past medical history of dementia, A-fib, factor V on Xarelto which further complicates the presenting complaint. The complaint involves an extensive differential diagnosis and also carries with it a high risk of complications and morbidity.    The differential diagnosis includes ACS, arrhythmia, electrolyte abnormality, dehydration, anemia, sepsis, infection, UTI, pneumonia, TIA  Additional history obtained: Additional history obtained from spouse and EMS  Records reviewed Primary Care Documents  ED Course and Reassessment: Upon patient's arrival to the emergency department she was noted to be in A-fib with RVR with a rate in the 120s to 140s.  She does appear dry on exam will be started with fluid resuscitation as likely cause of her tachycardia.  Due to patient's episode of slurred speech concern for possible TIA, though may also be secondary to metabolic encephalopathy.  She is back to her neurologic baseline.  Labs, CT head and urine will be performed.  She will be closely reassessed.  Independent labs interpretation:  The following labs were independently  interpreted: Hyponatremia, AKI, hypokalemia, metabolic acidosis with elevated anion gap  Independent visualization of imaging: - I independently visualized the following imaging with scope of interpretation limited to determining acute  life threatening conditions related to emergency care: CT head, chest x-ray, which revealed no acute disease  Consultation: - Consulted or discussed management/test interpretation w/ external professional: Hospitalist  Consideration for admission or further workup: Requires admission for further management and evaluation of her hyponatremia and AKI with metabolic acidosis Social Determinants of health: N/A    Amount and/or Complexity of Data Reviewed Labs: ordered. Radiology: ordered.  Risk Prescription drug management. Decision regarding hospitalization.          Final Clinical Impression(s) / ED Diagnoses Final diagnoses:  AKI (acute kidney injury) ([place])  Metabolic acidosis  Hyponatremia    Rx / DC Orders ED Discharge Orders     None         Kemper Durie, DO 09/24/22 2237

## 2022-09-24 NOTE — ED Notes (Signed)
Pt to xray

## 2022-09-25 DIAGNOSIS — G309 Alzheimer's disease, unspecified: Secondary | ICD-10-CM | POA: Diagnosis not present

## 2022-09-25 DIAGNOSIS — Z7901 Long term (current) use of anticoagulants: Secondary | ICD-10-CM | POA: Diagnosis not present

## 2022-09-25 DIAGNOSIS — I482 Chronic atrial fibrillation, unspecified: Secondary | ICD-10-CM | POA: Diagnosis not present

## 2022-09-25 DIAGNOSIS — D6851 Activated protein C resistance: Secondary | ICD-10-CM | POA: Diagnosis not present

## 2022-09-25 DIAGNOSIS — F0280 Dementia in other diseases classified elsewhere without behavioral disturbance: Secondary | ICD-10-CM

## 2022-09-25 LAB — CBC
HCT: 36.6 % (ref 36.0–46.0)
Hemoglobin: 12.8 g/dL (ref 12.0–15.0)
MCH: 31.6 pg (ref 26.0–34.0)
MCHC: 35 g/dL (ref 30.0–36.0)
MCV: 90.4 fL (ref 80.0–100.0)
Platelets: 225 10*3/uL (ref 150–400)
RBC: 4.05 MIL/uL (ref 3.87–5.11)
RDW: 14.1 % (ref 11.5–15.5)
WBC: 9.8 10*3/uL (ref 4.0–10.5)
nRBC: 0 % (ref 0.0–0.2)

## 2022-09-25 LAB — URINALYSIS, MICROSCOPIC (REFLEX): RBC / HPF: NONE SEEN RBC/hpf (ref 0–5)

## 2022-09-25 LAB — BASIC METABOLIC PANEL
Anion gap: 16 — ABNORMAL HIGH (ref 5–15)
Anion gap: 15 (ref 5–15)
BUN: 13 mg/dL (ref 8–23)
BUN: 12 mg/dL (ref 8–23)
CO2: 21 mmol/L — ABNORMAL LOW (ref 22–32)
CO2: 20 mmol/L — ABNORMAL LOW (ref 22–32)
Calcium: 7.4 mg/dL — ABNORMAL LOW (ref 8.9–10.3)
Calcium: 7.9 mg/dL — ABNORMAL LOW (ref 8.9–10.3)
Chloride: 84 mmol/L — ABNORMAL LOW (ref 98–111)
Chloride: 89 mmol/L — ABNORMAL LOW (ref 98–111)
Creatinine, Ser: 1.47 mg/dL — ABNORMAL HIGH (ref 0.44–1.00)
Creatinine, Ser: 1.02 mg/dL — ABNORMAL HIGH (ref 0.44–1.00)
GFR, Estimated: 35 mL/min — ABNORMAL LOW (ref >60)
GFR, Estimated: 54 mL/min — ABNORMAL LOW (ref >60)
Glucose, Bld: 84 mg/dL (ref 70–99)
Glucose, Bld: 75 mg/dL (ref 70–99)
Potassium: 2.9 mmol/L — ABNORMAL LOW (ref 3.5–5.1)
Potassium: 3.9 mmol/L (ref 3.5–5.1)
Sodium: 121 mmol/L — ABNORMAL LOW (ref 135–145)
Sodium: 124 mmol/L — ABNORMAL LOW (ref 135–145)

## 2022-09-25 LAB — URINALYSIS, ROUTINE W REFLEX MICROSCOPIC
Glucose, UA: NEGATIVE mg/dL
Ketones, ur: NEGATIVE mg/dL
Leukocytes,Ua: NEGATIVE
Nitrite: NEGATIVE
Protein, ur: NEGATIVE mg/dL
Specific Gravity, Urine: 1.02 (ref 1.005–1.030)
pH: 5.5 (ref 5.0–8.0)

## 2022-09-25 LAB — MAGNESIUM: Magnesium: 1 mg/dL — ABNORMAL LOW (ref 1.7–2.4)

## 2022-09-25 LAB — LACTIC ACID, PLASMA: Lactic Acid, Venous: 3 mmol/L (ref 0.5–1.9)

## 2022-09-25 LAB — TSH: TSH: 1.513 u[IU]/mL (ref 0.350–4.500)

## 2022-09-25 MED ORDER — SODIUM CHLORIDE 0.9 % IV BOLUS: 1000.0000 mL | Freq: Once | INTRAVENOUS | Status: DC

## 2022-09-25 MED ORDER — POTASSIUM CHLORIDE IN NACL 40-0.9 MEQ/L-% IV SOLN
INTRAVENOUS | Status: DC
  Filled 2022-09-25 (×3): qty 1000

## 2022-09-25 MED ORDER — SODIUM CHLORIDE 0.9 % IV BOLUS
500.0000 mL | Freq: Once | INTRAVENOUS | Status: AC
  Administered 2022-09-25: 500 mL via INTRAVENOUS

## 2022-09-25 MED ORDER — MAGNESIUM SULFATE 4 GM/100ML IV SOLN
4.0000 g | Freq: Once | INTRAVENOUS | Status: AC
  Administered 2022-09-25: 4 g via INTRAVENOUS
  Filled 2022-09-25: qty 100

## 2022-09-25 MED ORDER — POTASSIUM CHLORIDE 10 MEQ/100ML IV SOLN
10.0000 meq | INTRAVENOUS | Status: AC
  Administered 2022-09-25 (×2): 10 meq via INTRAVENOUS
  Filled 2022-09-25 (×2): qty 100

## 2022-09-25 MED ORDER — POTASSIUM CHLORIDE 10 MEQ/100ML IV SOLN
10.0000 meq | INTRAVENOUS | Status: AC
  Administered 2022-09-25 (×4): 10 meq via INTRAVENOUS
  Filled 2022-09-25 (×4): qty 100

## 2022-09-25 MED ORDER — SODIUM CHLORIDE 0.9 % IV BOLUS: 250.0000 mL | Freq: Once | INTRAVENOUS | Status: DC

## 2022-09-25 MED ORDER — MAGNESIUM SULFATE IN D5W 1-5 GM/100ML-% IV SOLN
1.0000 g | Freq: Once | INTRAVENOUS | Status: AC
  Administered 2022-09-25: 1 g via INTRAVENOUS
  Filled 2022-09-25: qty 100

## 2022-09-25 NOTE — ED Notes (Signed)
Patient changed by this tech and RN, new purewick placed, and gown changed.

## 2022-09-25 NOTE — ED Notes (Signed)
Pt removed iv for 2nd time; new IV started, infusions resumed; reminded pt not to pulled at lines; pt confused, unable to verbalized understanding; green mitts placed on pt

## 2022-09-25 NOTE — ED Notes (Signed)
ED TO INPATIENT HANDOFF REPORT  ED Nurse Name and Phone #:   S Name/Age/Gender Judith [name] 86 y.o. female Room/Bed: 002C/002C  Code Status   Code Status: DNR  Home/SNF/Other Home Patient oriented to: self Is this baseline? No   Triage Complete: Triage complete  Chief Complaint Acute hyponatremia [E87.1]  Triage Note No notes on file   Allergies Allergies  Allergen Reactions   Penicillins     REACTION: rash   Sulfonamide Derivatives     REACTION: Unknown reaction    Level of Care/Admitting Diagnosis ED Disposition     ED Disposition  Admit   Condition  --   [facility]: [place] [[id number]]  Level of Care: Telemetry Medical [[id number]]  May admit patient to [facility] or [facility] if equivalent level of care is available:: Yes  Covid Evaluation: Asymptomatic - no recent exposure (last 10 days) testing not required  Diagnosis: Acute hyponatremia [950932]  Admitting Physician: Quintella Baton [[id number]]  Attending Physician: Quintella Baton [[id number]]  Certification:: I certify this patient will need inpatient services for at least 2 midnights  Estimated Length of Stay: 2          B Medical/Surgery History Past Medical History:  Diagnosis Date   Adenocarcinoma of uterus ([place]) 83   DVT (deep vein thrombosis) in pregnancy    Both lower extremities   Heterozygous factor V Leiden mutation ([place])    Hyperlipidemia    Hypertension    Osteoporosis 06/2014   T score -2.6 left forearm stable from prior DEXA off Fosamax   Pulmonary embolus ([place]) 2007   and DVT, on chronic anti-coagulation   Stress incontinence, female    Past Surgical History:  Procedure Laterality Date   CLEFT PALATE REPAIR     COLONOSCOPY  09/22/2011   Procedure: COLONOSCOPY;  Surgeon: Juanita Craver, MD;  Location: [facility];  Service: Endoscopy;  Laterality: N/A;   HAMMER TOE SURGERY  1990-91   3 toes each foot   nasal septum repair     TUBAL LIGATION      vaginectomy       A IV Location/Drains/Wounds Patient Lines/Drains/Airways Status     Active Line/Drains/Airways     Name Placement date Placement time Site Days   Peripheral IV 09/25/22 20 G Anterior;Left Forearm 09/25/22  1254  Forearm  less than 1   External Urinary Catheter 09/25/22  1245  --  less than 1            Intake/Output Last 24 hours No intake or output data in the 24 hours ending 09/25/22 1439  Labs/Imaging Results for orders placed or performed during the hospital encounter of 09/24/22 (from the past 48 hour(s))  Comprehensive metabolic panel     Status: Abnormal   Collection Time: 09/24/22  6:50 PM  Result Value Ref Range   Sodium 120 (L) 135 - 145 mmol/L   Potassium 3.2 (L) 3.5 - 5.1 mmol/L    Comment: HEMOLYSIS AT THIS LEVEL MAY AFFECT RESULT   Chloride 75 (L) 98 - 111 mmol/L   CO2 19 (L) 22 - 32 mmol/L   Glucose, Bld 92 70 - 99 mg/dL    Comment: Glucose reference range applies only to samples taken after fasting for at least 8 hours.   BUN 15 8 - 23 mg/dL   Creatinine, Ser 1.97 (H) 0.44 - 1.00 mg/dL   Calcium 8.5 (L) 8.9 - 10.3 mg/dL   Total Protein 6.6 6.5 - 8.1  g/dL   Albumin 3.3 (L) 3.5 - 5.0 g/dL   AST 101 (H) 15 - 41 U/L    Comment: HEMOLYSIS AT THIS LEVEL MAY AFFECT RESULT   ALT 32 0 - 44 U/L    Comment: HEMOLYSIS AT THIS LEVEL MAY AFFECT RESULT   Alkaline Phosphatase 62 38 - 126 U/L   Total Bilirubin 1.5 (H) 0.3 - 1.2 mg/dL    Comment: HEMOLYSIS AT THIS LEVEL MAY AFFECT RESULT   GFR, Estimated 25 (L) >60 mL/min    Comment: (NOTE) Calculated using the CKD-EPI Creatinine Equation (2021)    Anion gap 26 (H) 5 - 15    Comment: Electrolytes repeated to confirm. Performed at [facility], [place] [address] [place], [place] [address]   CBC with Differential     Status: Abnormal   Collection Time: 09/24/22  6:50 PM  Result Value Ref Range   WBC 10.6 (H) 4.0 - 10.5 K/uL   RBC 4.41 3.87 - 5.11 MIL/uL   Hemoglobin 13.8 12.0 - 15.0 g/dL    HCT 39.8 36.0 - 46.0 %   MCV 90.2 80.0 - 100.0 fL   MCH 31.3 26.0 - 34.0 pg   MCHC 34.7 30.0 - 36.0 g/dL   RDW 13.7 11.5 - 15.5 %   Platelets 303 150 - 400 K/uL   nRBC 0.0 0.0 - 0.2 %   Neutrophils Relative % 85 %   Neutro Abs 9.2 (H) 1.7 - 7.7 K/uL   Lymphocytes Relative 6 %   Lymphs Abs 0.6 (L) 0.7 - 4.0 K/uL   Monocytes Relative 8 %   Monocytes Absolute 0.8 0.1 - 1.0 K/uL   Eosinophils Relative 0 %   Eosinophils Absolute 0.0 0.0 - 0.5 K/uL   Basophils Relative 0 %   Basophils Absolute 0.0 0.0 - 0.1 K/uL   Immature Granulocytes 1 %   Abs Immature Granulocytes 0.05 0.00 - 0.07 K/uL    Comment: Performed at [facility] [address] [place], [place] [address]  Lactic acid, plasma     Status: Abnormal   Collection Time: 09/24/22 10:41 PM  Result Value Ref Range   Lactic Acid, Venous 2.0 (HH) 0.5 - 1.9 mmol/L    Comment: CRITICAL RESULT CALLED TO, READ BACK BY AND VERIFIED WITH Berta Minor, RN, 2337 09/24/22, Courtney Paris Performed at [facility], [place] [address], [place], [place] [address]   Beta-hydroxybutyric acid     Status: Abnormal   Collection Time: 09/24/22 10:41 PM  Result Value Ref Range   Beta-Hydroxybutyric Acid 1.43 (H) 0.05 - 0.27 mmol/L    Comment: Performed at [facility] [address], [place], [place] [address]  Magnesium     Status: Abnormal   Collection Time: 09/24/22 10:41 PM  Result Value Ref Range   Magnesium 1.0 (L) 1.7 - 2.4 mg/dL    Comment: Performed at [facility] [address] [place], [place] [address]  Phosphorus     Status: None   Collection Time: 09/24/22 10:41 PM  Result Value Ref Range   Phosphorus 3.3 2.5 - 4.6 mg/dL    Comment: Performed at [facility], [place] [address] [place], [place] [address]  TSH     Status: None   Collection Time: 09/24/22 10:41 PM  Result Value Ref Range   TSH 1.513 0.350 - 4.500 uIU/mL    Comment: Performed by a 3rd Generation assay with a functional sensitivity of <=0.01  uIU/mL.  Performed at [facility], [place] [address], [place], [place] [address]   Urinalysis, Routine w reflex microscopic     Status: Abnormal   Collection Time: 09/25/22 12:30 AM  Result Value Ref Range   Color, Urine YELLOW YELLOW   APPearance CLEAR CLEAR   Specific Gravity, Urine 1.020 1.005 - 1.030   pH 5.5 5.0 - 8.0   Glucose, UA NEGATIVE NEGATIVE mg/dL   Hgb urine dipstick LARGE (A) NEGATIVE    Comment: REPEATED TO VERIFY   Bilirubin Urine SMALL (A) NEGATIVE   Ketones, ur NEGATIVE NEGATIVE mg/dL   Protein, ur NEGATIVE NEGATIVE mg/dL   Nitrite NEGATIVE NEGATIVE   Leukocytes,Ua NEGATIVE NEGATIVE    Comment: Performed at [facility] [address] [place], [place] [address]  Lactic acid, plasma     Status: Abnormal   Collection Time: 09/25/22 12:30 AM  Result Value Ref Range   Lactic Acid, Venous 3.0 (HH) 0.5 - 1.9 mmol/L    Comment: CRITICAL VALUE NOTED.  VALUE IS CONSISTENT WITH PREVIOUSLY REPORTED AND CALLED VALUE. Performed at [facility], [place] [address] [place], [place] [address]   Basic metabolic panel     Status: Abnormal   Collection Time: 09/25/22 12:30 AM  Result Value Ref Range   Sodium 121 (L) 135 - 145 mmol/L   Potassium 2.9 (L) 3.5 - 5.1 mmol/L   Chloride 84 (L) 98 - 111 mmol/L   CO2 21 (L) 22 - 32 mmol/L   Glucose, Bld 84 70 - 99 mg/dL    Comment: Glucose reference range applies only to samples taken after fasting for at least 8 hours.   BUN 13 8 - 23 mg/dL   Creatinine, Ser 1.47 (H) 0.44 - 1.00 mg/dL   Calcium 7.4 (L) 8.9 - 10.3 mg/dL   GFR, Estimated 35 (L) >60 mL/min    Comment: (NOTE) Calculated using the CKD-EPI Creatinine Equation (2021)    Anion gap 16 (H) 5 - 15    Comment: Performed at [facility] [address], [place], [place] [address]  Urinalysis, Microscopic (reflex)     Status: Abnormal   Collection Time: 09/25/22 12:30 AM  Result Value Ref Range   RBC / HPF NONE SEEN 0 - 5 RBC/hpf    Comment: REPEATED TO  VERIFY   WBC, UA 0-5 0 - 5 WBC/hpf   Bacteria, UA RARE (A) NONE SEEN   Squamous Epithelial / LPF 0-5 0 - 5   Urine-Other LESS THAN 10 mL OF URINE SUBMITTED     Comment: MICROSCOPIC EXAM PERFORMED ON UNCONCENTRATED URINE Performed at [facility], [place] [address], [place], [place] [address]   Magnesium     Status: Abnormal   Collection Time: 09/25/22 12:30 AM  Result Value Ref Range   Magnesium 1.0 (L) 1.7 - 2.4 mg/dL    Comment: Performed at [facility] [address], [place], [place] [address]   DG Chest 2 View  Result Date: 09/24/2022 CLINICAL DATA:  Altered mental status, tachycardia EXAM: CHEST - 2 VIEW COMPARISON:  07/23/2022 FINDINGS: Lungs are clear.  No pleural effusion or pneumothorax. The heart is normal in size. Moderate hiatal hernia. Mild degenerative changes of the visualized thoracolumbar spine. IMPRESSION: Normal chest radiographs. Moderate hiatal hernia. Electronically Signed   By: Julian Hy M.D.   On: 09/24/2022 20:03   CT HEAD WO CONTRAST  Result Date: 09/24/2022 CLINICAL DATA:  TIA.  Mental status change. EXAM:  CT HEAD WITHOUT CONTRAST TECHNIQUE: Contiguous axial images were obtained from the base of the skull through the vertex without intravenous contrast. RADIATION DOSE REDUCTION: This exam was performed according to the departmental dose-optimization program which includes automated exposure control, adjustment of the mA and/or kV according to patient size and/or use of iterative reconstruction technique. COMPARISON:  07/23/2022 FINDINGS: Brain: No acute infarct, hemorrhage, or mass lesion is present. Moderate atrophy and white matter changes are stable. Marked temporal lobe atrophy is noted in particular, similar the prior exam. The ventricles are proportionate to the degree of atrophy. Basal ganglia are within normal limits. No significant extraaxial fluid collection is present. Brainstem and cerebellum are unremarkable. Vascular: Atherosclerotic  calcifications are present within the cavernous internal carotid arteries bilaterally. No hyperdense vessel is present. Skull: Calvarium is intact. No focal lytic or blastic lesions are present. No significant extracranial soft tissue lesion is present. Sinuses/Orbits: Bilateral lens replacements are noted. Globes and orbits are otherwise unremarkable. The paranasal sinuses and mastoid air cells are clear. IMPRESSION: 1. No acute intracranial abnormality or significant interval change. 2. Stable atrophy and white matter disease. This likely reflects the sequela of chronic microvascular ischemia. Electronically Signed   By: San Morelle M.D.   On: 09/24/2022 19:47    Pending Labs Unresulted Labs (From admission, onward)     Start     Ordered   09/26/22 0500  Comprehensive metabolic panel  Tomorrow morning,   R        09/25/22 0934   09/26/22 0500  Magnesium  Tomorrow morning,   R        09/25/22 0934   09/25/22 9147  Basic metabolic panel  Tomorrow morning,   R        09/24/22 2316   09/25/22 0100  CBC  Once,   R        09/25/22 0100   09/25/22 0100  Osmolality  Once,   R        09/25/22 0100   09/24/22 2246  Sodium, urine, random  Once,   URGENT        09/24/22 2245   09/24/22 2246  Osmolality, urine  Once,   URGENT        09/24/22 2245            Vitals/Pain Today's Vitals   09/25/22 1115 09/25/22 1256 09/25/22 1315 09/25/22 1345  BP:  104/63  (!) 90/55  Pulse:  99  (!) 26  Resp: '16 16  14  '$ Temp:   (!) 97.5 F (36.4 C)   TempSrc:      SpO2:  99%    PainSc:        Isolation Precautions No active isolations  Medications Medications  acetaminophen (TYLENOL) tablet 650 mg (has no administration in time range)    Or  acetaminophen (TYLENOL) suppository 650 mg (has no administration in time range)  senna-docusate (Senokot-S) tablet 1 tablet (has no administration in time range)  ondansetron (ZOFRAN) tablet 4 mg (has no administration in time range)    Or   ondansetron (ZOFRAN) injection 4 mg (has no administration in time range)  metoprolol tartrate (LOPRESSOR) tablet 25 mg (25 mg Oral Not Given 09/25/22 0906)  donepezil (ARICEPT) tablet 5 mg (5 mg Oral Given 09/24/22 2351)  pravastatin (PRAVACHOL) tablet 20 mg (20 mg Oral Given 09/25/22 0924)  rivaroxaban (XARELTO) tablet 10 mg (10 mg Oral Given 09/25/22 1016)  0.9 % NaCl with KCl 40 mEq / L  infusion (  Intravenous New Bag/Given 09/25/22 0820)  lactated ringers bolus 1,000 mL (0 mLs Intravenous Stopped 09/24/22 2245)  potassium chloride 10 mEq in 100 mL IVPB (0 mEq Intravenous Stopped 09/25/22 0129)  lactated ringers bolus 1,000 mL (0 mLs Intravenous Stopped 09/25/22 0050)  magnesium sulfate IVPB 2 g 50 mL (0 g Intravenous Stopped 09/25/22 0155)  magnesium sulfate IVPB 1 g 100 mL (0 g Intravenous Stopped 09/25/22 0335)  potassium chloride 10 mEq in 100 mL IVPB (0 mEq Intravenous Stopped 09/25/22 0815)  sodium chloride 0.9 % bolus 500 mL (0 mLs Intravenous Stopped 09/25/22 0815)  potassium chloride 10 mEq in 100 mL IVPB (0 mEq Intravenous Stopped 09/25/22 1405)  magnesium sulfate IVPB 4 g 100 mL (0 g Intravenous Stopped 09/25/22 1257)    Mobility walks with person assist     Focused Assessments    R Recommendations: See Admitting Provider Note  Report given to:   Additional Notes:

## 2022-09-25 NOTE — ED Notes (Signed)
Completed in and out but only emptied a small amount.

## 2022-09-25 NOTE — Progress Notes (Addendum)
PROGRESS NOTE    Judith [name]  ZOX:[id number] DOB: 05-Nov-1936 DOA: 09/24/2022 PCP: Axel Filler, MD   Brief Narrative:  86 year old female with history of Alzheimer's dementia, paroxysmal A-fib, factor V Leyden deficiency, PE and DVTs on Xarelto presented with a brief episode of confusion lasting for few seconds along with extremely poor appetite/oral intake over the last few weeks.  On presentation, she was found to be in A-fib with RVR with heart rates in the 130s to 140s.  Sodium 120, creatinine 1.97.  She was started on IV fluids.  Assessment & Plan:   Hyponatremia Dehydration -Presented with poor appetite/oral intake over the last few weeks.  Sodium 120 on presentation.  Sodium 121 this morning.  Switch IV fluids to normal saline with supplemental potassium at 100 cc an hour.  Repeat a.m. labs.  Acute kidney injury -Creatinine 1.97 on presentation, creatinine 0.93 on 07/23/22.  Improving to 1.47 this morning.  Repeat a.m. labs.  IV fluids as above  Hypokalemia Replace.  Repeat a.m. labs  Hypomagnesemia Replace.  Repeat a.m. labs  Paroxysmal A-fib with RVR -Heart rates in the 130s to 140s on presentation.  Currently much improved.  Continue metoprolol blood pressure allows.  Continue Xarelto.  Transient mental status changes Dementia -Currently mental status possibly back to baseline.  CT of the head was negative for acute abnormality. -Continue donepezil.  Fall/delirium precautions  Failure to thrive Very poor oral intake Goals of care -Discussed with husband at bedside who agrees for DNR. -Palliative consultation for goals of care discussion -Consult dietitian -PT eval  Essential hypertension -Blood pressure on the lower side.  Continue metoprolol with hold parameters  Hyperlipidemia -Continue pravastatin  Factor V Leyden deficiency History of DVT, PE -Continue Xarelto  DVT prophylaxis: Xarelto Code Status: DNR.  Confirmed by her husband at  bedside Family Communication: Husband at bedside Disposition Plan: Status is: Inpatient Remains inpatient appropriate because: Of severity of illness.  Still hyponatremic  Consultants: Consult palliative care  Procedures: None  Antimicrobials: None   Subjective: Patient seen and examined at bedside.  Awake, hard of hearing, slow to respond, confused.  No fever, vomiting, chest pain reported.  Oral intake poor.  Objective: Vitals:   09/25/22 0700 09/25/22 0815 09/25/22 0822 09/25/22 0900  BP: 91/67 101/77  105/78  Pulse:  82  74  Resp: 16 20  (!) 21  Temp:   (!) 97.5 F (36.4 C)   TempSrc:      SpO2:  90%     No intake or output data in the 24 hours ending 09/25/22 0922 There were no vitals filed for this visit.  Examination:  General exam: Appears calm and comfortable.  Currently on room air.  Elderly female lying in bed. Respiratory system: Bilateral decreased breath sounds at bases with some scattered crackles Cardiovascular system: S1 & S2 heard, intermittent tachycardic gastrointestinal system: Abdomen is nondistended, soft and nontender. Normal bowel sounds heard. Extremities: No cyanosis, clubbing; trace lower extremity edema Central nervous system: Awake, confused, no focal neurological deficits. Moving extremities Skin: No rashes, lesions or ulcers Psychiatry: Not agitated.  Intermittently smiling.    Data Reviewed: I have personally reviewed following labs and imaging studies  CBC: Recent Labs  Lab 09/24/22 1850  WBC 10.6*  NEUTROABS 9.2*  HGB 13.8  HCT 39.8  MCV 90.2  PLT 811   Basic Metabolic Panel: Recent Labs  Lab 09/24/22 1850 09/24/22 2241 09/25/22 0030  NA 120*  --  121*  K 3.2*  --  2.9*  CL 75*  --  84*  CO2 19*  --  21*  GLUCOSE 92  --  84  BUN 15  --  13  CREATININE 1.97*  --  1.47*  CALCIUM 8.5*  --  7.4*  MG  --  1.0* 1.0*  PHOS  --  3.3  --    GFR: CrCl cannot be calculated (Unknown ideal weight.). Liver Function  Tests: Recent Labs  Lab 09/24/22 1850  AST 101*  ALT 32  ALKPHOS 62  BILITOT 1.5*  PROT 6.6  ALBUMIN 3.3*   No results for input(s): "LIPASE", "AMYLASE" in the last 168 hours. No results for input(s): "AMMONIA" in the last 168 hours. Coagulation Profile: No results for input(s): "INR", "PROTIME" in the last 168 hours. Cardiac Enzymes: No results for input(s): "CKTOTAL", "CKMB", "CKMBINDEX", "TROPONINI" in the last 168 hours. BNP (last 3 results) No results for input(s): "PROBNP" in the last 8760 hours. HbA1C: No results for input(s): "HGBA1C" in the last 72 hours. CBG: No results for input(s): "GLUCAP" in the last 168 hours. Lipid Profile: No results for input(s): "CHOL", "HDL", "LDLCALC", "TRIG", "CHOLHDL", "LDLDIRECT" in the last 72 hours. Thyroid Function Tests: Recent Labs    09/24/22 2241  TSH 1.513   Anemia Panel: No results for input(s): "VITAMINB12", "FOLATE", "FERRITIN", "TIBC", "IRON", "RETICCTPCT" in the last 72 hours. Sepsis Labs: Recent Labs  Lab 09/24/22 2241 09/25/22 0030  LATICACIDVEN 2.0* 3.0*    No results found for this or any previous visit (from the past 240 hour(s)).       Radiology Studies: DG Chest 2 View  Result Date: 09/24/2022 CLINICAL DATA:  Altered mental status, tachycardia EXAM: CHEST - 2 VIEW COMPARISON:  07/23/2022 FINDINGS: Lungs are clear.  No pleural effusion or pneumothorax. The heart is normal in size. Moderate hiatal hernia. Mild degenerative changes of the visualized thoracolumbar spine. IMPRESSION: Normal chest radiographs. Moderate hiatal hernia. Electronically Signed   By: Julian Hy M.D.   On: 09/24/2022 20:03   CT HEAD WO CONTRAST  Result Date: 09/24/2022 CLINICAL DATA:  TIA.  Mental status change. EXAM: CT HEAD WITHOUT CONTRAST TECHNIQUE: Contiguous axial images were obtained from the base of the skull through the vertex without intravenous contrast. RADIATION DOSE REDUCTION: This exam was performed according  to the departmental dose-optimization program which includes automated exposure control, adjustment of the mA and/or kV according to patient size and/or use of iterative reconstruction technique. COMPARISON:  07/23/2022 FINDINGS: Brain: No acute infarct, hemorrhage, or mass lesion is present. Moderate atrophy and white matter changes are stable. Marked temporal lobe atrophy is noted in particular, similar the prior exam. The ventricles are proportionate to the degree of atrophy. Basal ganglia are within normal limits. No significant extraaxial fluid collection is present. Brainstem and cerebellum are unremarkable. Vascular: Atherosclerotic calcifications are present within the cavernous internal carotid arteries bilaterally. No hyperdense vessel is present. Skull: Calvarium is intact. No focal lytic or blastic lesions are present. No significant extracranial soft tissue lesion is present. Sinuses/Orbits: Bilateral lens replacements are noted. Globes and orbits are otherwise unremarkable. The paranasal sinuses and mastoid air cells are clear. IMPRESSION: 1. No acute intracranial abnormality or significant interval change. 2. Stable atrophy and white matter disease. This likely reflects the sequela of chronic microvascular ischemia. Electronically Signed   By: San Morelle M.D.   On: 09/24/2022 19:47        Scheduled Meds:  donepezil  5 mg Oral QHS   metoprolol tartrate  25  mg Oral BID   pravastatin  20 mg Oral Daily   rivaroxaban  10 mg Oral Daily   Continuous Infusions:  0.9 % NaCl with KCl 40 mEq / L 100 mL/hr at 09/25/22 0820   potassium chloride 10 mEq (09/25/22 0821)          Aline August, MD [facility] 09/25/2022, 9:22 AM

## 2022-09-25 NOTE — Progress Notes (Signed)
    Referral received for Lajuana Carry :goals of care discussion. Chart reviewed. Patient assessed and is unable to engage appropriately in discussions. No family present at the bedside during my visit.  Attempted to contact patient's husband Kendyl Festa via cell phone and home phone numbers.  I then attempted to reach son Cree Napoli by cell phone. Unable to reach family today for [place] discussion. Voicemail messages were left with contact information given to both patient's husband and son.   PMT will re-attempt to contact family at a later time/date. Detailed note and recommendations to follow once GOC has been completed.   Thank you for your referral and allowing PMT to assist in Mr./Mrs. Lennie Taff's care.   Dorthy Cooler, [facility] Palliative Medicine Team  Team Phone # [phone]   NO CHARGE

## 2022-09-25 NOTE — ED Notes (Signed)
MD Crosley paged about pt's BP trending down.

## 2022-09-25 NOTE — ED Notes (Signed)
Dr. Starla Link at bedside

## 2022-09-25 NOTE — ED Notes (Signed)
Pt pulled out IV line replaced with another

## 2022-09-26 DIAGNOSIS — G309 Alzheimer's disease, unspecified: Secondary | ICD-10-CM | POA: Diagnosis not present

## 2022-09-26 DIAGNOSIS — Z7189 Other specified counseling: Secondary | ICD-10-CM | POA: Diagnosis not present

## 2022-09-26 DIAGNOSIS — I482 Chronic atrial fibrillation, unspecified: Secondary | ICD-10-CM | POA: Diagnosis not present

## 2022-09-26 DIAGNOSIS — N179 Acute kidney failure, unspecified: Secondary | ICD-10-CM | POA: Diagnosis not present

## 2022-09-26 LAB — BASIC METABOLIC PANEL
Anion gap: 13 (ref 5–15)
Anion gap: 10 (ref 5–15)
BUN: 13 mg/dL (ref 8–23)
BUN: 13 mg/dL (ref 8–23)
CO2: 18 mmol/L — ABNORMAL LOW (ref 22–32)
CO2: 19 mmol/L — ABNORMAL LOW (ref 22–32)
Calcium: 7.9 mg/dL — ABNORMAL LOW (ref 8.9–10.3)
Calcium: 8 mg/dL — ABNORMAL LOW (ref 8.9–10.3)
Chloride: 97 mmol/L — ABNORMAL LOW (ref 98–111)
Chloride: 98 mmol/L (ref 98–111)
Creatinine, Ser: 0.89 mg/dL (ref 0.44–1.00)
Creatinine, Ser: 0.94 mg/dL (ref 0.44–1.00)
GFR, Estimated: >60 mL/min (ref >60)
GFR, Estimated: 60 mL/min — ABNORMAL LOW (ref >60)
Glucose, Bld: 63 mg/dL — ABNORMAL LOW (ref 70–99)
Glucose, Bld: 75 mg/dL (ref 70–99)
Potassium: 5.2 mmol/L — ABNORMAL HIGH (ref 3.5–5.1)
Potassium: 4.6 mmol/L (ref 3.5–5.1)
Sodium: 128 mmol/L — ABNORMAL LOW (ref 135–145)
Sodium: 127 mmol/L — ABNORMAL LOW (ref 135–145)

## 2022-09-26 LAB — COMPREHENSIVE METABOLIC PANEL
ALT: 25 U/L (ref 0–44)
AST: 64 U/L — ABNORMAL HIGH (ref 15–41)
Albumin: 2.1 g/dL — ABNORMAL LOW (ref 3.5–5.0)
Alkaline Phosphatase: 45 U/L (ref 38–126)
Anion gap: 10 (ref 5–15)
BUN: 13 mg/dL (ref 8–23)
CO2: 21 mmol/L — ABNORMAL LOW (ref 22–32)
Calcium: 7.7 mg/dL — ABNORMAL LOW (ref 8.9–10.3)
Chloride: 95 mmol/L — ABNORMAL LOW (ref 98–111)
Creatinine, Ser: 0.88 mg/dL (ref 0.44–1.00)
GFR, Estimated: >60 mL/min (ref >60)
Glucose, Bld: 82 mg/dL (ref 70–99)
Potassium: 4.4 mmol/L (ref 3.5–5.1)
Sodium: 126 mmol/L — ABNORMAL LOW (ref 135–145)
Total Bilirubin: 0.5 mg/dL (ref 0.3–1.2)
Total Protein: 4.6 g/dL — ABNORMAL LOW (ref 6.5–8.1)

## 2022-09-26 LAB — OSMOLALITY: Osmolality: 259 mOsm/kg — ABNORMAL LOW (ref 275–295)

## 2022-09-26 LAB — MAGNESIUM: Magnesium: 2.2 mg/dL (ref 1.7–2.4)

## 2022-09-26 MED ORDER — LIDOCAINE 5 % EX PTCH
1.0000 | MEDICATED_PATCH | CUTANEOUS | Status: DC
  Administered 2022-09-26 – 2022-09-29 (×4): 1 via TRANSDERMAL
  Filled 2022-09-26 (×4): qty 1

## 2022-09-26 MED ORDER — KETOCONAZOLE 2 % EX CREA
TOPICAL_CREAM | Freq: Every day | CUTANEOUS | Status: DC
  Filled 2022-09-26: qty 15

## 2022-09-26 MED ORDER — SODIUM CHLORIDE 0.9 % IV SOLN: INTRAVENOUS | Status: AC

## 2022-09-26 NOTE — Consult Note (Signed)
Consultation Note Date: 09/26/2022   Patient Name: Judith [name]  DOB: Mar 19, 1936  MRN: [id number]  Age / Sex: 86 y.o., female  PCP: Axel Filler, MD Referring Physician: Charise Killian, MD  Reason for Consultation: Establishing goals of care  HPI/Patient Profile: 86 y.o. female  with past medical history of severe Alzheimer's dementia, atrial fibrillation, factor V Leyden deficiency, PE and DVTs  admitted on 09/24/2022 with confusion, poor oral intake.   Patient admitted for A-fib with RVR, hyponatremia, AKI due to dehydration.  PMT has been consulted to assist with goals of care conversation.  Clinical Assessment and Goals of Care:  I have reviewed medical records including EPIC notes, labs and imaging, received report from RN, assessed the patient and then met at the bedside and relocated to 5 N. conference room with patient's husband Kasandra Knudsen to discuss diagnosis prognosis, [place], EOL wishes, disposition and options.  I introduced Palliative Medicine as specialized medical care for people living with serious illness. It focuses on providing relief from the symptoms and stress of a serious illness. The goal is to improve quality of life for both the patient and the family.  We discussed a brief life review of the patient and then focused on their current illness.  The natural disease trajectory and expectations at EOL were discussed.  I attempted to elicit values and goals of care important to the patient.    Medical History Review and Understanding:  We discussed patient's acute illness in the context of her chronic comorbidities including Alzheimer's dementia, factor V Leiden deficiency, and atrial fibrillation.  Kasandra Knudsen has a good understanding of the severity of her illness.  Social History: Patient lives at home with her husband.  This is both of their second marriages.  They have been together for over  30 years.  She has 1 son from a previous marriage who is living in [place], where she is from, and 1 son who is deceased.  Patient's 2 stepsons are available locally for assistance whenever needed.  She is described as a very stubborn and independent woman.  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  She enjoys spending time with her dog.  Functional and Nutritional State: Patient has had very little oral intake despite encouragement for the past 3 to 4 weeks, sometimes only eating a container of applesauce and some sips of Diet Coke.  Prior to admission, he reports she was able to take a few steps with a little bit of help -Kasandra Knudsen will hold her by the arm occasionally.  She has a wheelchair at home as well.  He notes she sometimes gets up on her own without him realizing, like when he falls asleep watching TV.  He is starting to realize that he may not be able to continue caring for alone.  Palliative Symptoms: Back pain, confusion  Code Status: Concepts specific to code status, artifical feeding and hydration, and rehospitalization were considered and discussed.   Discussion: We discussed patient's Alzheimer's dementia primarily, reviewing the last discussion Kasandra Knudsen had with her PCP Dr. Evette Doffing around 2 years ago.  Discussed potential for rapid progression after an incident such as patient's fall around 9 weeks ago. [name] notes patient has been declining since then and this makes sense to him.  They have family members who have had Alzheimer's, such as patient's mother, but patient has never discussed her wishes or care preferences in detail before.  Counseled on signs of disease progression.  Kasandra Knudsen does  not want patient to suffer and understands the irreversible and untreatable nature of Alzheimer's disease.   He does not think she would want to go to an assisted living facility or SNF, but worries this may need to be the next step if she is unable to ambulate safely.   I shared with him that I worry about this as well.  Counseled on the different services and resources available in different settings such as ALF, LTC, SNF. He notes that she is very limited by her back pain as well and would hate to put her through PT if this will cause her more suffering.  He is not certain if she would ever want a feeding tube.  He would like to continue current care for another couple of days to see how much she can improve before making any decisions.   Discussed the importance of continued conversation with family and the medical providers regarding overall plan of care and treatment options, ensuring decisions are within the context of the patient's values and GOCs.   Questions and concerns were addressed.  Hard Choices booklet left for review. The family was encouraged to call with questions or concerns.  PMT will continue to support holistically.   SUMMARY OF RECOMMENDATIONS   -DNR -Continue current care -Patient's husband wishes to discuss further with her and reflect on whether he wishes to pursue SNF for rehab -Will meet again with patient's husband tomorrow at 70 AM -Psychosocial and emotional support provided -PMT will continue to follow and support  Prognosis:  Poor long-term prognosis given functional/nutritional/cognitive decline, advanced Alzheimer's dementia  Discharge Planning: To Be Determined      Primary Diagnoses: Present on Admission:  Hyponatremia  Hypokalemia  AKI (acute kidney injury) ([place])  Anorexia  Essential hypertension  Factor V Leiden, prothrombin gene mutation ([place])  Hyperlipidemia  Alzheimer's dementia ([place])  Acute hyponatremia  Physical Exam Vitals and nursing note reviewed.  Constitutional:      General: She is not in acute distress.    Appearance: She is ill-appearing.  Cardiovascular:     Rate and Rhythm: Normal rate.  Pulmonary:     Effort: Pulmonary effort is normal. No respiratory distress.  Skin:    General:  Skin is warm and dry.  Neurological:     Mental Status: She is alert. She is disoriented.  Psychiatric:        Mood and Affect: Mood normal.    Vital Signs: BP 106/73 (BP Location: Left Arm)   Pulse (!) 103   Temp 98.1 F (36.7 C) (Oral)   Resp 17   Ht _0  (1.702 m)   Wt 79.1 kg   SpO2 100%   BMI 27.31 kg/m  Pain Scale: 0-10 POSS *See Group Information*: 1-Acceptable,Awake and alert Pain Score: 2    SpO2: SpO2: 100 % O2 Device:SpO2: 100 % O2 Flow Rate: .   Palliative Assessment/Data: 30%      Total time: I spent 110 minutes in the care of the patient today in the above activities and documenting the encounter.  MDM: High   Baldemar Dady Johnnette Litter, PA-C  Palliative Medicine Team Team phone # [phone]  Thank you for allowing the Palliative Medicine Team to assist in the care of this patient. Please utilize secure chat with additional questions, if there is no response within 30 minutes please call the above phone number.  Palliative Medicine Team providers are available by phone from 7am to 7pm daily and can be reached through the  team cell phone.  Should this patient require assistance outside of these hours, please call the patient's attending physician.

## 2022-09-26 NOTE — Progress Notes (Signed)
Rec'd patient from ED. Patient has breakdown on bottom that is consistent with the shape of a bedpan. Also has significant bruising to ischium/trochanter area.  Noticed severe redness under the patients breast skin has began to crack. Very painful to the patient. Also has significant pain and redness in her mouth. Patient asked if she fell again. Husband stated she was brought to the hospital 6 weeks ago for a fall. He was unable to follow up with neurology due to the patient being unable to ambulate. Husband asked if the team would have Neuro consult on her lumbar back fractures from the last fall.

## 2022-09-26 NOTE — Evaluation (Signed)
Physical Therapy Evaluation Patient Details Name: Judith [name] MRN: [id number] DOB: [birth year] Today's Date: 09/26/2022  History of Present Illness  Pt is an 86 y.o. female admitted 11/17 with  a fib with RVR and hyponatremia.  Recent fall in Sept 2023 sustaining L1 comp fx. PMH:  Alzeimer's demenia, atrial fibrillation, factor V leiden, PE and DVTs on chronic anticoagulation   Clinical Impression  Pt admitted with above diagnosis. PTA pt lived at home with husband. Ambulatory prior to Sept 2023 fall and w/c dependent since that time. Pt currently with functional limitations due to the deficits listed below (see PT Problem List). On eval, pt required max assist rolling, and max assist sidelying <> sit. Unable to tolerate sitting EOB due to back pain. Pleasantly confused. Asking over and over "Where's [name]?" who is her husband. Pt will benefit from skilled PT to increase their independence and safety with mobility to allow discharge to the venue listed below.          Recommendations for follow up therapy are one component of a multi-disciplinary discharge planning process, led by the attending physician.  Recommendations may be updated based on patient status, additional functional criteria and insurance authorization.  Follow Up Recommendations Skilled nursing-short term rehab (<3 hours/day) Can patient physically be transported by private vehicle: No    Assistance Recommended at Discharge Frequent or constant Supervision/Assistance  Patient can return home with the following  Two people to help with walking and/or transfers;Two people to help with bathing/dressing/bathroom;Assistance with cooking/housework;Direct supervision/assist for medications management;Assist for transportation;Help with stairs or ramp for entrance    Equipment Recommendations Other (comment) (TBD)  Recommendations for Other Services       Functional Status Assessment Patient has had a recent decline in their  functional status and/or demonstrates limited ability to make significant improvements in function in a reasonable and predictable amount of time     Precautions / Restrictions Precautions Precautions: Fall      Mobility  Bed Mobility Overal bed mobility: Needs Assistance Bed Mobility: Rolling, Sidelying to Sit, Sit to Sidelying Rolling: Max assist Sidelying to sit: Max assist     Sit to sidelying: Max assist General bed mobility comments: attempted sitting EOB but had to immediately return to supine due to back pain.    Transfers                   General transfer comment: unable due to pain    Ambulation/Gait                  Stairs            Wheelchair Mobility    Modified Rankin (Stroke Patients Only)       Balance                                             Pertinent Vitals/Pain Pain Assessment Pain Assessment: Faces Faces Pain Scale: Hurts whole lot Pain Location: back with mobility Pain Descriptors / Indicators: Moaning, Grimacing, Guarding Pain Intervention(s): Limited activity within patient's tolerance, Repositioned    Home Living Family/patient expects to be discharged to:: Private residence Living Arrangements: Spouse/significant other Available Help at Discharge: Family;Available 24 hours/day Type of Home: House           Home Equipment: Wheelchair - manual Additional Comments: Pt unable to provide history. Family not available. Above  information taken from chart.    Prior Function               Mobility Comments: ambulatory prior to fall in Sept 2023. Husband has been using a wheelchair to move pt around the house since the fall. ADLs Comments: husband assists with all ADLs     Hand Dominance        Extremity/Trunk Assessment   Upper Extremity Assessment Upper Extremity Assessment: Generalized weakness    Lower Extremity Assessment Lower Extremity Assessment: Generalized  weakness    Cervical / Trunk Assessment Cervical / Trunk Assessment: Kyphotic  Communication   Communication: Other (comment) (Alzheimer's dementia)  Cognition Arousal/Alertness: Awake/alert Behavior During Therapy: Anxious, WFL for tasks assessed/performed Overall Cognitive Status: History of cognitive impairments - at baseline                                 General Comments: Alzheimer's dementia. Repetitive speech. "I don't know where I'm at," "Where is [name]?," "I am an Therapist, sports."        General Comments General comments (skin integrity, edema, etc.): HR in 110s    Exercises     Assessment/Plan    PT Assessment Patient needs continued PT services  PT Problem List Decreased strength;Decreased balance;Decreased cognition;Pain;Decreased knowledge of precautions;Decreased mobility;Decreased knowledge of use of DME;Decreased activity tolerance;Decreased safety awareness       PT Treatment Interventions DME instruction;Functional mobility training;Balance training;Patient/family education;Gait training;Therapeutic activities;Therapeutic exercise    PT Goals (Current goals can be found in the Care Plan section)  Acute Rehab PT Goals Patient Stated Goal: unable to state PT Goal Formulation: Patient unable to participate in goal setting Time For Goal Achievement: 10/10/22 Potential to Achieve Goals: Fair    Frequency Min 2X/week     Co-evaluation               AM-PAC PT "6 Clicks" Mobility  Outcome Measure Help needed turning from your back to your side while in a flat bed without using bedrails?: A Lot Help needed moving from lying on your back to sitting on the side of a flat bed without using bedrails?: Total Help needed moving to and from a bed to a chair (including a wheelchair)?: Total Help needed standing up from a chair using your arms (e.g., wheelchair or bedside chair)?: Total Help needed to walk in hospital room?: Total Help needed climbing 3-5  steps with a railing? : Total 6 Click Score: 7    End of Session   Activity Tolerance: Patient limited by pain Patient left: in bed;with bed alarm set Nurse Communication: Mobility status PT Visit Diagnosis: Other abnormalities of gait and mobility (R26.89);Pain    Time: 1410-1425 PT Time Calculation (min) (ACUTE ONLY): 15 min   Charges:   PT Evaluation $PT Eval Moderate Complexity: 1 Mod          Lorrin Goodell, PT  Office # [phone] Pager [phone]   Lorriane Shire 09/26/2022, 3:17 PM

## 2022-09-26 NOTE — Progress Notes (Signed)
Summar:dsf Judith [name] is a 86 year old person living with alzeimer's demenia, atrial fibrillation, factor V leiden, PE and DVTs on chronic anticoagulation with xarelto who was brought to the ED by husband for  and functional status admitted for Afib with FVR and hyponatremia.   Subjective: Patient lay ing bed appear fidgety and confused. Complains her back hurts. Husband at bedside states she is still confused but more interactive than from admission. Reports difficulty taking care of her since her fall in September with L1 compression fracture. Reports worsening functional status and memory since then.   Objective:  Vital signs in last 24 hours: Vitals:   09/25/22 1950 09/25/22 2151 09/25/22 2323 09/26/22 0449  BP: 98/66 105/61 (!) 95/53 99/63  Pulse: 79 94 92 90  Resp: '18 18 18 16  '$ Temp: 98.2 F (36.8 C)   97.6 F (36.4 C)  TempSrc: Oral   Oral  SpO2: 100% 98% 98% 100%  Weight: 79.1 kg     Height: '5\' 7"'$  (1.702 m)      Constitutional: frail. Chronically ill HENT: Normocephalic and atraumatic, EOMI, conjunctiva normal, moist mucous membranes Cardiovascular: tachycardic, irregular, no murmurs, trace bilateral JVD Respiratory: No respiratory distress, no accessory muscle use.  Effort is normal.  Lungs are clear to auscultation bilaterally. GI: Nondistended, soft, nontender to palpation, normal active bowel sounds Musculoskeletal: sarcogenic Neurological: oriented to self and place. Poor short term memory repeatly asking the same questions about hospitalization. Poor attention. Moving all extremes, no facial droop Skin: intertriginous changes under breasts Psychiatric: anxious  Assessment/Plan:  Principal Problem:   Chronic atrial fibrillation with RVR (H[facility]) Active Problems:   Factor V Leiden, prothrombin gene mutation ([place])   Essential hypertension   Long term current use of anticoagulant   Hyperlipidemia   AKI (acute kidney injury) ([place])   Alzheimer's dementia (H[facility])    Hyponatremia   Hypokalemia   Anorexia   Acute hyponatremia   Hyponatremia Sodium improved from 120-126 this morning on NS 137m/hr. Suspect due to hypovolemic hyponatremia. Has had very poor PO intake due to dementia and recent fall. Urine studies not completed yesterday. Sample may have been too small? Has received IV fluids and now improving, will hold off on urine studies at this point. -stop NS infusion, goal Na  correct ~6102ml/L in 24 hours ( 132 by 0700 11/20) - Check BMP q5, can give small bolus D5W if overcorrecting - Drinkign more today, encourage PO intake  Atrial fibrillation Presented in RVR. HR this morning in 90s-low 100. No chest pain. Appears this is new since fall on 07/23/2022. Is aon chronic AC due to spontaneous DVT/PE with history factor V leiden CHA2DS2CaSc: 4 for age, sex, HTN.  - Continue metoprolol 25 mg twice daily - Check echo - continue xarelto  Dementia Failure to thrive Worsening memory and functional status after fall 2 months ago. Mental status seem close to baseline but need much more helt with ADLs and iADLS. - continue donepizil -PT -now DNR. Palliative to have GOInglesideeeting this afternoon.  Electrolyte abnormalities Hypomagnesemia and hypokalemia at admission, now resolved.  Lumbar back pain L1 compression fracture after rolling out of bed in September.  -PT -Tylenol, lidocaine patch   Sacral pressure ulcer Note by RN seems to have been on commode too long. Husband had taken her to ED after she was on commode at home for too long and could not get up. Hypokalemia on admission - turn pain  to help with off loading  HTN -  holding home HCTZ and lisinopril in setting of low pressure  HLD -Continue pravastatin  Factor V Leiden  DVT, PE - continue Xarelto 10 mg daily,   Prior to Admission Living Arrangement: Anticipated Discharge Location: Barriers to Discharge: Dispo: Anticipated discharge in approximately 2 day(s).   Judith Beard,  MD 09/26/2022, 8:20 AM Pager: [phone] After 5pm on weekdays and 1pm on weekends: On Call pager [phone]

## 2022-09-27 ENCOUNTER — Inpatient Hospital Stay (HOSPITAL_COMMUNITY): Payer: Medicare Other

## 2022-09-27 DIAGNOSIS — Z7901 Long term (current) use of anticoagulants: Secondary | ICD-10-CM | POA: Diagnosis not present

## 2022-09-27 DIAGNOSIS — E43 Unspecified severe protein-calorie malnutrition: Secondary | ICD-10-CM

## 2022-09-27 DIAGNOSIS — E871 Hypo-osmolality and hyponatremia: Secondary | ICD-10-CM | POA: Diagnosis not present

## 2022-09-27 DIAGNOSIS — I482 Chronic atrial fibrillation, unspecified: Secondary | ICD-10-CM | POA: Diagnosis not present

## 2022-09-27 DIAGNOSIS — I4891 Unspecified atrial fibrillation: Secondary | ICD-10-CM | POA: Diagnosis not present

## 2022-09-27 DIAGNOSIS — E875 Hyperkalemia: Secondary | ICD-10-CM | POA: Diagnosis not present

## 2022-09-27 DIAGNOSIS — G309 Alzheimer's disease, unspecified: Secondary | ICD-10-CM | POA: Diagnosis not present

## 2022-09-27 DIAGNOSIS — D6851 Activated protein C resistance: Secondary | ICD-10-CM | POA: Diagnosis not present

## 2022-09-27 LAB — BASIC METABOLIC PANEL
Anion gap: 14 (ref 5–15)
Anion gap: 17 — ABNORMAL HIGH (ref 5–15)
Anion gap: 17 — ABNORMAL HIGH (ref 5–15)
BUN: 15 mg/dL (ref 8–23)
BUN: 17 mg/dL (ref 8–23)
BUN: 18 mg/dL (ref 8–23)
CO2: 17 mmol/L — ABNORMAL LOW (ref 22–32)
CO2: 18 mmol/L — ABNORMAL LOW (ref 22–32)
CO2: 17 mmol/L — ABNORMAL LOW (ref 22–32)
Calcium: 8.8 mg/dL — ABNORMAL LOW (ref 8.9–10.3)
Calcium: 9.1 mg/dL (ref 8.9–10.3)
Calcium: 9 mg/dL (ref 8.9–10.3)
Chloride: 98 mmol/L (ref 98–111)
Chloride: 94 mmol/L — ABNORMAL LOW (ref 98–111)
Chloride: 92 mmol/L — ABNORMAL LOW (ref 98–111)
Creatinine, Ser: 1.07 mg/dL — ABNORMAL HIGH (ref 0.44–1.00)
Creatinine, Ser: 1.03 mg/dL — ABNORMAL HIGH (ref 0.44–1.00)
Creatinine, Ser: 1.18 mg/dL — ABNORMAL HIGH (ref 0.44–1.00)
GFR, Estimated: 51 mL/min — ABNORMAL LOW (ref >60)
GFR, Estimated: 54 mL/min — ABNORMAL LOW (ref >60)
GFR, Estimated: 46 mL/min — ABNORMAL LOW (ref >60)
Glucose, Bld: 73 mg/dL (ref 70–99)
Glucose, Bld: 76 mg/dL (ref 70–99)
Glucose, Bld: 115 mg/dL — ABNORMAL HIGH (ref 70–99)
Potassium: 5 mmol/L (ref 3.5–5.1)
Potassium: 5.5 mmol/L — ABNORMAL HIGH (ref 3.5–5.1)
Potassium: 4.9 mmol/L (ref 3.5–5.1)
Sodium: 129 mmol/L — ABNORMAL LOW (ref 135–145)
Sodium: 129 mmol/L — ABNORMAL LOW (ref 135–145)
Sodium: 126 mmol/L — ABNORMAL LOW (ref 135–145)

## 2022-09-27 LAB — CBC
HCT: 40.3 % (ref 36.0–46.0)
Hemoglobin: 13.7 g/dL (ref 12.0–15.0)
MCH: 31.9 pg (ref 26.0–34.0)
MCHC: 34 g/dL (ref 30.0–36.0)
MCV: 93.7 fL (ref 80.0–100.0)
Platelets: 263 10*3/uL (ref 150–400)
RBC: 4.3 MIL/uL (ref 3.87–5.11)
RDW: 14.9 % (ref 11.5–15.5)
WBC: 10 10*3/uL (ref 4.0–10.5)
nRBC: 0 % (ref 0.0–0.2)

## 2022-09-27 LAB — ECHOCARDIOGRAM COMPLETE
Height: 67 in
S' Lateral: 3.4 cm
Weight: 2828.94 oz

## 2022-09-27 MED ORDER — ADULT MULTIVITAMIN W/MINERALS CH
1.0000 | ORAL_TABLET | Freq: Every day | ORAL | Status: DC
  Administered 2022-09-27 – 2022-09-28 (×2): 1 via ORAL
  Filled 2022-09-27 (×2): qty 1

## 2022-09-27 MED ORDER — SODIUM CHLORIDE 0.9% FLUSH: 10.0000 mL | INTRAVENOUS | Status: DC | PRN

## 2022-09-27 MED ORDER — SODIUM ZIRCONIUM CYCLOSILICATE 10 G PO PACK: 10.0000 g | PACK | Freq: Once | ORAL | Status: DC

## 2022-09-27 MED ORDER — SODIUM CHLORIDE 0.9% FLUSH
10.0000 mL | Freq: Two times a day (BID) | INTRAVENOUS | Status: DC
  Administered 2022-09-27: 20 mL

## 2022-09-27 MED ORDER — BLISTEX MEDICATED EX OINT: TOPICAL_OINTMENT | CUTANEOUS | Status: DC | PRN

## 2022-09-27 MED ORDER — SODIUM ZIRCONIUM CYCLOSILICATE 10 G PO PACK
10.0000 g | PACK | Freq: Once | ORAL | Status: AC
  Administered 2022-09-27: 10 g via ORAL
  Filled 2022-09-27: qty 1

## 2022-09-27 MED ORDER — NYSTATIN 100000 UNIT/GM EX CREA
TOPICAL_CREAM | Freq: Two times a day (BID) | CUTANEOUS | Status: DC
  Filled 2022-09-27: qty 30

## 2022-09-27 MED ORDER — ENSURE ENLIVE PO LIQD
237.0000 mL | Freq: Three times a day (TID) | ORAL | Status: DC
  Administered 2022-09-27 – 2022-09-28 (×2): 237 mL via ORAL

## 2022-09-27 MED ORDER — SODIUM CHLORIDE 0.9 % IV SOLN: INTRAVENOUS | Status: DC

## 2022-09-27 NOTE — Progress Notes (Signed)
On call provider was paged through secure chat due to pt pulling leads off & pulling off iv site and restlessness. Dr. Tamsen Snider d/c telemetry order and start fluids in the morning. CCMD was contacted to stop monitoring.

## 2022-09-27 NOTE — Progress Notes (Signed)
Physical Therapy Treatment Patient Details Name: Judith [name] MRN: [id number] DOB: January 02, 1936 Today's Date: 09/27/2022   History of Present Illness Pt is an 86 y.o. female admitted 11/17 with  a fib with RVR and hyponatremia.  Recent fall in Sept 2023 sustaining L1 comp fx. PMH:  Alzeimer's demenia, atrial fibrillation, factor V leiden, PE and DVTs on chronic anticoagulation    PT Comments    Pt seen for PT tx with pt lying in bed, smelling of urine. Pt also very restless, constantly moving BUE. Pt is HOH and 2/2 this & impaired cognition, demonstrates poor ability to follow simple commands even with multimodal cuing. PT assists with pt supine>sidelying but upon sidelying>sitting pt c/o back pain & stating "I'd rather not". Pt assisted back supine & rolled L<>R to allow for staff to change bed linens & gown. Nurse notified of pt's c/o pain. Due to pt's significantly impaired cognition, not sure that she is an appropriate rehab candidate. Will see pt again on a trial basis to see if pt can appropriately participate in therapy, otherwise, recommendation may be updated to long term care.    Recommendations for follow up therapy are one component of a multi-disciplinary discharge planning process, led by the attending physician.  Recommendations may be updated based on patient status, additional functional criteria and insurance authorization.  Follow Up Recommendations  Skilled nursing-short term rehab (<3 hours/day) Can patient physically be transported by private vehicle: No   Assistance Recommended at Discharge Frequent or constant Supervision/Assistance  Patient can return home with the following Two people to help with walking and/or transfers;Two people to help with bathing/dressing/bathroom;Assistance with cooking/housework;Direct supervision/assist for medications management;Assist for transportation;Help with stairs or ramp for entrance;Assistance with feeding;Direct supervision/assist for  financial management   Equipment Recommendations  None recommended by PT (TBD in next venue)    Recommendations for Other Services       Precautions / Restrictions Precautions Precautions: Fall Precaution Comments: log roll for comfort, pt with L1 compression fx Restrictions Weight Bearing Restrictions: No     Mobility  Bed Mobility   Bed Mobility: Rolling Rolling: Max assist              Transfers                        Ambulation/Gait                   Stairs             Wheelchair Mobility    Modified Rankin (Stroke Patients Only)       Balance                                            Cognition Arousal/Alertness: Awake/alert Behavior During Therapy: Restless, Anxious Overall Cognitive Status: History of cognitive impairments - at baseline                                 General Comments: Pt with hx of dementia, also very HOH. Pt is able to state her name but otherwise does not follow commands. Pt repeatedly asking "where's Judith [name]?", pt also occasionally asking to get up but when PT assists her pt limited by back pain.        Exercises  General Comments        Pertinent Vitals/Pain Pain Assessment Pain Assessment: Faces Faces Pain Scale: Hurts even more Pain Location: low back Pain Descriptors / Indicators: Grimacing, Guarding, Discomfort Pain Intervention(s): Monitored during session, Limited activity within patient's tolerance, Repositioned (nurse notified)    Home Living                          Prior Function            PT Goals (current goals can now be found in the care plan section) Acute Rehab PT Goals Patient Stated Goal: unable to state PT Goal Formulation: Patient unable to participate in goal setting Time For Goal Achievement: 10/10/22 Potential to Achieve Goals: Poor Progress towards PT goals: PT to reassess next treatment    Frequency    Min  2X/week      PT Plan Current plan remains appropriate    Co-evaluation              AM-PAC PT "6 Clicks" Mobility   Outcome Measure  Help needed turning from your back to your side while in a flat bed without using bedrails?: Total Help needed moving from lying on your back to sitting on the side of a flat bed without using bedrails?: Total Help needed moving to and from a bed to a chair (including a wheelchair)?: Total Help needed standing up from a chair using your arms (e.g., wheelchair or bedside chair)?: Total Help needed to walk in hospital room?: Total Help needed climbing 3-5 steps with a railing? : Total 6 Click Score: 6    End of Session   Activity Tolerance: Patient limited by pain Patient left: in bed;with call bell/phone within reach;with bed alarm set;with nursing/sitter in room   PT Visit Diagnosis: Other abnormalities of gait and mobility (R26.89);Pain Pain - part of body:  (low back)     Time: 9735-3299 PT Time Calculation (min) (ACUTE ONLY): 17 min  Charges:  $Therapeutic Activity: 8-22 mins                     Lavone Nian, PT, DPT 09/27/22, 3:02 PM  Waunita Schooner 09/27/2022, 3:00 PM

## 2022-09-27 NOTE — Progress Notes (Addendum)
Initial Nutrition Assessment  DOCUMENTATION CODES:   Severe malnutrition in context of chronic illness  INTERVENTION:  -Continue regular diet as tolerated, provide meal preferences to optimize po intake -Provide feeding assistance at meals -Provide Ensure Enlive TID (350kcal, 20g protein/bottle) please provide in cup for better acceptance -Provide daily MVI for suspected micronutrient deficiencies  NUTRITION DIAGNOSIS:  Severe Malnutrition related to chronic illness as evidenced by percent weight loss, severe fat depletion, moderate muscle depletion, energy intake < 75% for > or equal to 1 month.  GOAL:  Patient will meet greater than or equal to 90% of their needs  MONITOR:  PO intake, Supplement acceptance  REASON FOR ASSESSMENT:  Consult Diet education  ASSESSMENT:  Pt is an 86yo F with PMH of severe Alzheimer's dementia, atrial fibrillation, HTN, HLD, osteoporosis, factor V Leyden deficiency, PE and DVTs who presents with a-fib.  Visited pt at bedside with husband present. Husband provided most of the history but pt participated in conversation every so often. He reports pt has been eating just bites of food, or applesauce. She has tried ONS in the past but didn't care for them. Diet recall meeting < 75% estimated needs for >1 month. NFPE shows severe fat loss, and moderate muscle wasting. Weight history in EMR shows significant 8.8% unintended weight loss in the last 2 months. Pt meets ASPEN criteria for severe protein calorie malnutrition.  Offered pt a shake and she was agreeable to chocolate. Put Ensure Enlive in cup to aid in acceptance. Pt drank ONS without difficulty. Discussed with husband if she starts to refuse it, we can also mix in ice cream. Provided Chocolate ice cream cup at bedside as well. Pt complains that food does not taste the way it used to, observed chipping nails and husband reports pt's hair has been thinning. Recommend starting MVI, husband agreeable to all  interventions. Continue to offer feeding assistance at meals.   Note, RD consulted for education, but pt not appropriate at this time. Completed full nutrition assessment.  Medications reviewed and include: lopressor, pravastatin  Labs reviewed: Na:129, Cr:1.07, GFR:51, osmolality:259   NUTRITION - FOCUSED PHYSICAL EXAM:  Flowsheet Row Most Recent Value  Orbital Region Severe depletion  Upper Arm Region Severe depletion  Thoracic and Lumbar Region Moderate depletion  Buccal Region Mild depletion  Temple Region Moderate depletion  Clavicle Bone Region Moderate depletion  Clavicle and Acromion Bone Region Moderate depletion  Scapular Bone Region Unable to assess  Dorsal Hand Severe depletion  Patellar Region Mild depletion  Anterior Thigh Region No depletion  Posterior Calf Region Mild depletion  Hair Reviewed  Eyes Reviewed  Mouth Reviewed  [taste changes]  Skin Reviewed  Nails Reviewed       Diet Order:   Diet Order             Diet regular Room service appropriate? Yes; Fluid consistency: Thin  Diet effective now                   EDUCATION NEEDS:   Not appropriate for education at this time  Skin:  Skin Assessment: Skin Integrity Issues: Skin Integrity Issues:: Other (Comment) Other: PIs on coccyx, buttocks and heels  Last BM:  11/19  Height:  Ht Readings from Last 1 Encounters:  09/25/22 '5\' 7"'$  (1.702 m)    Weight:  Wt Readings from Last 1 Encounters:  09/27/22 80.2 kg    BMI:  Body mass index is 27.69 kg/m.  Estimated Nutritional Needs:  Kcal:  1600-2000kcal Protein:  80-95g Fluid:  >2L  Candise Bowens, MS, RD, LDN, CNSC See AMiON for contact information

## 2022-09-27 NOTE — Care Management Important Message (Signed)
Important Message  Patient Details  Name: Judith [name] MRN: [id number] Date of Birth: [birth year]   Medicare Important Message Given:  Yes     Hannah Beat 09/27/2022, 3:10 PM

## 2022-09-27 NOTE — Progress Notes (Signed)
11/20 Pt unable to sign. I spoke to patient's son Japleen Tornow) and rcv'd verbal confirmation and acknowledgement of IM Letter.

## 2022-09-27 NOTE — Progress Notes (Incomplete)
Echocardiogram 2D Echocardiogram has been performed.  Ronny Flurry 09/27/2022, 8:57 AM

## 2022-09-27 NOTE — Hospital Course (Addendum)
This is a 86 year old female with a PMH of Alzheimer's dementia, atrial fibrillation, factor V Leiden deficiency, PE and DVTs on xarelto who was brought to the ED by husband for poor functional status, decrease PO intake admitted for Afib with FVR and hyponatremia.  During hospitalization, patient was transitiond to comfort care and will move to hospice care.   #Alzheimer's dementia #Failure to thrive #Hypertension #Hyperlipidemia #Hypovolemic hyponatremia #Severe malnutrition #Atrial fibrillation #Electrolyte abnormalities Patient initially came after a 2 week history of poor P.O intake and failure to thrive. Patient had electrolyte abnormalities and we tried to correct the patient's hydration status during the admission. Patient is only oriented x1 at baseline and patient did not improve with supportive care. Patient continued to have poor P.O. intake. Palliative care was consulted. Given patient's failure to thrive, poor p.o. intake, and Alzheimer's dementia, long conversation was had with palliative care during patient's hospitalization, and decision to move to comfort care was made on 09/28/2022.  All interventions have been discontinued including IV antibiotics, labs, imaging, or fluids.  Being plan of care at this time is to ensure patient has the right modalities to keep her comfortable at this time in life.  Patient's husband understands what is going on, spiritual care was available as needed for the patient's husband and the patient. [facility]. Hospice will manage all comfort care modalities on discharge.

## 2022-09-27 NOTE — Progress Notes (Signed)
HD#3 Subjective:   Summary: Judith [name] is a 86 year old female with PMH of Alzheimer's demenia, atrial fibrillation, factor V Leiden deficiency, PE and DVTs on xarelto who was brought to the ED by husband for poor functional status, decrease PO intake admitted for Afib with FVR and hyponatremia.   Overnight Events: No overnight events  Pt is laying in bed on supine. Husband at the bedside. Pt is comfortably communicating in low voice. Husband states pt is eating and drinking  poorly since she fell about 9 weeks ago. Her oral intake have got worse over the past week and she is refusing to even drink water. Today she only took one bite from her morning breakfast tray. Although she is more receptive to drinking by mouth today.   Objective:  Vital signs in last 24 hours: Vitals:   09/26/22 2026 09/27/22 0406 09/27/22 0438 09/27/22 1017  BP: 123/72 104/77  124/82  Pulse: (!) 107 67    Resp:  17    Temp: 98 F (36.7 C) 98.3 F (36.8 C)    TempSrc: Oral     SpO2: 98%     Weight:   80.2 kg   Height:       Supplemental O2: Room Air SpO2: 98 %   Physical Exam:  Constitutional: Chronically ill appearing. Not in acute distress. Skin dry with poor turgor HENT: normocephalic atraumatic, mucous membranes moist Eyes: conjunctiva non-erythematous Neck: supple Cardiovascular: regular rate and rhythm, no m/r/g Pulmonary/Chest: normal work of breathing on room air, lungs clear to auscultation bilaterally Abdominal: soft, non-tender, non-distended MSK: normal bulk and tone Neurological: alert & oriented to self and place. Moving all extremities. Skin: warm and dry, poor skin turgor Psych: normal mood and affect.  Filed Weights   09/25/22 1950 09/27/22 0438  Weight: 79.1 kg 80.2 kg    No intake or output data in the 24 hours ending 09/27/22 1420 Net IO Since Admission: 1,765.66 mL [09/27/22 1420]  Pertinent Labs:    Latest Ref Rng & Units 09/27/2022    7:04 AM 09/25/2022     6:15 PM 09/24/2022    6:50 PM  CBC  WBC 4.0 - 10.5 K/uL 10.0  9.8  10.6   Hemoglobin 12.0 - 15.0 g/dL 13.7  12.8  13.8   Hematocrit 36.0 - 46.0 % 40.3  36.6  39.8   Platelets 150 - 400 K/uL 263  225  303        Latest Ref Rng & Units 09/27/2022    7:04 AM 09/26/2022    8:08 PM 09/26/2022    1:37 PM  CMP  Glucose 70 - 99 mg/dL 73  75  63   BUN 8 - 23 mg/dL '15  13  13   '$ Creatinine 0.44 - 1.00 mg/dL 1.07  0.94  0.89   Sodium 135 - 145 mmol/L 129  127  128   Potassium 3.5 - 5.1 mmol/L 5.0  4.6  5.2   Chloride 98 - 111 mmol/L 98  98  97   CO2 22 - 32 mmol/L '17  19  18   '$ Calcium 8.9 - 10.3 mg/dL 8.8  8.0  7.9     Imaging: ECHOCARDIOGRAM COMPLETE  Result Date: 09/27/2022    ECHOCARDIOGRAM REPORT   Patient Name:   [facility] Date of Exam: 09/27/2022 Medical Rec #:  [id number]      Height:       67.0 in Accession #:    [id number]  Weight:       176.8 lb Date of Birth:  10/23/1936       BSA:          1.919 m Patient Age:    39 years       BP:           104/77 mmHg Patient Gender: F              HR:           115 bpm. Exam Location:  Inpatient Procedure: 2D Echo, Cardiac Doppler and Color Doppler Indications:    Atrial Fibrillation I48.91  History:        Patient has no prior history of Echocardiogram examinations.                 Arrythmias:Atrial Fibrillation; Risk Factors:Hypertension and                 Dyslipidemia.  Sonographer:    Ronny Flurry Referring Phys: [id number] GRACE LAU IMPRESSIONS  1. Patient declined full echo. Unable to assess anterior and inferior myocardium. Left ventricular ejection fraction, by estimation, is 45 to 50%. The left ventricle has mildly decreased function. The left ventricle has no regional wall motion abnormalities. Left ventricular diastolic function could not be evaluated.  2. Right ventricular systolic function is normal. The right ventricular size is normal.  3. The mitral valve is normal in structure. Mild mitral valve regurgitation. No evidence of  mitral stenosis.  4. Aortic valve gradients not assessed. Study stopped early per patient request. The aortic valve is tricuspid. There is mild calcification of the aortic valve. There is mild thickening of the aortic valve. Aortic valve regurgitation is not visualized.  5. Study not completed per patient request. FINDINGS  Left Ventricle: Patient declined full echo. Unable to assess anterior and inferior myocardium. Left ventricular ejection fraction, by estimation, is 45 to 50%. The left ventricle has mildly decreased function. The left ventricle has no regional wall motion abnormalities. The left ventricular internal cavity size was normal in size. There is no left ventricular hypertrophy. Left ventricular diastolic function could not be evaluated.  LV Wall Scoring: The mid and distal anterior septum and inferior septum are hypokinetic. The entire lateral wall, basal anteroseptal segment, and apex are normal. Right Ventricle: The right ventricular size is normal. No increase in right ventricular wall thickness. Right ventricular systolic function is normal. Left Atrium: Left atrial size was normal in size. Right Atrium: Right atrial size was normal in size. Pericardium: There is no evidence of pericardial effusion. Mitral Valve: The mitral valve is normal in structure. Mild mitral annular calcification. Mild mitral valve regurgitation. No evidence of mitral valve stenosis. Tricuspid Valve: The tricuspid valve is normal in structure. Tricuspid valve regurgitation is mild . No evidence of tricuspid stenosis. Aortic Valve: Aortic valve gradients not assessed. Study stopped early per patient request. The aortic valve is tricuspid. There is mild calcification of the aortic valve. There is mild thickening of the aortic valve. Aortic valve regurgitation is not visualized. Pulmonic Valve: The pulmonic valve was normal in structure. Pulmonic valve regurgitation is mild. No evidence of pulmonic stenosis. Aorta: The aortic  root is normal in size and structure. Venous: Study not completed per patient request. IAS/Shunts: No atrial level shunt detected by color flow Doppler.  LEFT VENTRICLE PLAX 2D LVIDd:         4.70 cm LVIDs:         3.40 cm LV PW:  1.20 cm LV IVS:        1.00 cm LVOT diam:     1.90 cm LVOT Area:     2.84 cm  LEFT ATRIUM         Index LA diam:    5.00 cm 2.61 cm/m   AORTA Ao Root diam: 2.80 cm Ao Asc diam:  2.70 cm TRICUSPID VALVE TR Peak grad:   26.4 mmHg TR Vmax:        257.00 cm/s  SHUNTS Systemic Diam: 1.90 cm Skeet Latch MD Electronically signed by Skeet Latch MD Signature Date/Time: 09/27/2022/9:14:29 AM    Final     Assessment/Plan:   Principal Problem:   Chronic atrial fibrillation with RVR ([place]) Active Problems:   Factor V Leiden, prothrombin gene mutation ([place])   Essential hypertension   Long term current use of anticoagulant   Hyperlipidemia   AKI (acute kidney injury) ([place])   Alzheimer's dementia ([place])   Hyponatremia   Hypokalemia   Anorexia   Acute hyponatremia   Protein-calorie malnutrition, severe   Patient Summary:  Hyponatremia # Failure to thrive # Severe malnutrition Patient has poor p.o. intake for past 2 weeks.  Hyponatremia is likely in the setting of low p.o. solute intake. I strongly suspect hyponatremia is secondary to hypovolemia.  Poor p.o. intake is also from worsening dementia.  Sodium improved with IV hydration with normal saline. Her current sodium is 129.  IV infusion of normal saline was held until the next BMP results.  Patient was also evaluated by dietitian.  Patient was also diagnosed with severe malnutrition.  Dietary recommendations were made.  Ensure Enlive 237 mL 3 times daily was added to the treatment regimen. - Check BMP q4, can give small bolus D5W if overcorrecting - Drinkign more today, encourage PO intake. -Continue Ensure Enlive 237 mL 3 times daily.   Atrial fibrillation Patient was in A-fib with RVR with heart rate  around 120-130 upon arrival.  Patient is on metoprolol 25 mg twice daily which is adequately controlling the heart rate.  Heart rate remains between 70-80.  Echo was completed today with estimated EF of 45 to 50% with no left ventricle regional wall motion abnormalities - Continue metoprolol 25 mg twice daily. - continue xarelto   Dementia Failure to thrive Worsening memory and functional status after fall 2 months ago. Mental status seem close to baseline but need much more help with ADLs and iADLS. - continue donepizil -PT -DNR after the palliative meeting.Palliative meeting today to determine further GOC   Electrolyte abnormalities Hypomagnesemia and hypokalemia at admission, now resolved.   Lumbar back pain L1 compression fracture after rolling out of bed in September.  -PT -Tylenol, lidocaine patch    Sacral pressure ulcer Superficial skin break to sacrum with mild surrounding redness.  No drainage appreciated. -Frequent repositioning -Sacral foam dressing to prevent further skin break -Ensure Enlive will also help improve nutritional status. -Encouraged to keep the area clean.   HTN -Continue metoprolol 25 mg BID (holding home HCTZ and lisinopril)   HLD -Continue pravastatin  Prior to Admission Living Arrangement: Home Anticipated Discharge Location: TBD after palliative care consultation Barriers to Discharge: Failure to thrive/Dementia Dispo: Anticipated discharge in approximately 2 day(s).     Teola Bradley, MD Internal Medicine Resident PGY-1 Pager: [phone] Please contact the on call pager after 5 pm and on weekends at [phone].

## 2022-09-27 NOTE — Care Management Important Message (Signed)
Important Message  Patient Details  Name: Judith [name] MRN: [id number] Date of Birth: [birth year]   Medicare Important Message Given:  Other (see comment)     Hannah Beat 09/27/2022, 3:30 PM

## 2022-09-27 NOTE — TOC Initial Note (Signed)
Transition of Care [facility]) - Initial/Assessment Note    Patient Details  Name: Judith [name] MRN: [id number] Date of Birth: Aug 29, 1936  Transition of Care [facility]) CM/SW Contact:    Judith Chars, LCSW Phone Number: 09/27/2022, 4:00 PM  Clinical Narrative:      Pt oriented x1, CSW spoke with husband Judith [name] by phone.  He is agreeable to send out pt referral for SNF, has not made up his mind 100% on DC plan, still talking with palliative team as well.  Pt lives at home with him, no current services.  Pt has been vaccinated for covid with at least on booster.               Expected Discharge Plan: [place] Barriers to Discharge: Continued Medical Work up, SNF Pending bed offer   Patient Goals and CMS Choice     Choice offered to / list presented to : Spouse  Expected Discharge Plan and Services Expected Discharge Plan: [place] In-house Referral: Clinical Social Work   Post Acute Care Choice: [place] Living arrangements for the past 2 months: [place]                                      Prior Living Arrangements/Services Living arrangements for the past 2 months: Single Family Home Lives with:: Spouse Patient language and need for interpreter reviewed:: No        Need for Family Participation in Patient Care: Yes (Comment) Care giver support system in place?: Yes (comment) Current home services: Other (comment) (none) Criminal Activity/Legal Involvement Pertinent to Current Situation/Hospitalization: No - Comment as needed  Activities of Daily Living      Permission Sought/Granted                  Emotional Assessment Appearance:: Appears stated age Attitude/Demeanor/Rapport: Unable to Assess Affect (typically observed): Unable to Assess Orientation: : Oriented to Self      Admission diagnosis:  Acute hyponatremia [E87.1] Hyponatremia [D74.1] Metabolic acidosis [O87.86] AKI (acute kidney  injury) ([place]) [N17.9] Patient Active Problem List   Diagnosis Date Noted   Protein-calorie malnutrition, severe 09/27/2022   Hyperkalemia 09/27/2022   Hyponatremia 09/24/2022   Hypokalemia 09/24/2022   Anorexia 09/24/2022   Chronic atrial fibrillation with RVR ([place]) 09/24/2022   Acute hyponatremia 09/24/2022   Hearing loss 03/24/2020   Knee osteoarthritis 08/22/2017   Alzheimer's dementia ([place]) 10/24/2013   AKI (acute kidney injury) ([place]) 08/22/2012   Hyperlipidemia 03/16/2011   Preventative health care 03/16/2011   Long term current use of anticoagulant 11/29/2010   Factor V Leiden, prothrombin gene mutation ([place]) 06/19/2009   Essential hypertension 01/16/2007   Osteoporosis 01/16/2007   PCP:  Judith Filler, MD Pharmacy:   [id number] [place], [place] [id number]: [phone]: [phone]    Social Determinants of Health (SDOH) Interventions    Readmission Risk Interventions     No data to display

## 2022-09-27 NOTE — Progress Notes (Signed)
Paged by RN patient with worsening confusion after telemetry placed back on patient. She was found to have an elevated potassium of 5.5. Mittens were placed but also made her more agitated.   Evaluated bedside, patient is pleasantly confused with bed alarm on. Encouraged to complete lokelma. Will DC telemetry and order repeat potassium for later this evening. She initially presented with hypokalemia and today is having hyperkalemia. If worsening potassium can order EKG to assess for changes and give temporizing measures if need be.

## 2022-09-27 NOTE — NC FL2 (Signed)
[place] LEVEL OF CARE SCREENING TOOL     IDENTIFICATION  Patient Name: Judith [name] Birthdate: 1936/05/29 Sex: female Admission Date (Current Location): 09/24/2022  [facility] and [place] Number:  Herbalist and Address:  [facility] [place], [place] [address] [place], [place] [address]      Provider Number: [id number]  Attending Physician Name and Address:  Campbell Riches, MD  Relative Name and Phone Number:  Judith, [name] Spouse [phone]  [phone]    Current Level of Care: Hospital Recommended Level of Care: [place] Prior Approval Number:    Date Approved/Denied:   PASRR Number: [id number]  Discharge Plan: SNF    Current Diagnoses: Patient Active Problem List   Diagnosis Date Noted   Protein-calorie malnutrition, severe 09/27/2022   Hyperkalemia 09/27/2022   Hyponatremia 09/24/2022   Hypokalemia 09/24/2022   Anorexia 09/24/2022   Chronic atrial fibrillation with RVR ([place]) 09/24/2022   Acute hyponatremia 09/24/2022   Hearing loss 03/24/2020   Knee osteoarthritis 08/22/2017   Alzheimer's dementia ([place]) 10/24/2013   AKI (acute kidney injury) ([place]) 08/22/2012   Hyperlipidemia 03/16/2011   Preventative health care 03/16/2011   Long term current use of anticoagulant 11/29/2010   Factor V Leiden, prothrombin gene mutation ([place]) 06/19/2009   Essential hypertension 01/16/2007   Osteoporosis 01/16/2007    Orientation RESPIRATION BLADDER Height & Weight     Self  Normal Incontinent Weight: 176 lb 12.9 oz (80.2 kg) Height:  '5\' 7"'$  (170.2 cm)  BEHAVIORAL SYMPTOMS/MOOD NEUROLOGICAL BOWEL NUTRITION STATUS      Incontinent Diet (see discharge summary)  AMBULATORY STATUS COMMUNICATION OF NEEDS Skin   Total Care Verbally Skin abrasions                       Personal Care Assistance Level of Assistance  Bathing, Feeding, Dressing Bathing Assistance: Maximum assistance Feeding assistance: Maximum  assistance Dressing Assistance: Maximum assistance     Functional Limitations Info  Sight, Hearing, Speech Sight Info: Adequate Hearing Info: Impaired Speech Info: Adequate    SPECIAL CARE FACTORS FREQUENCY  PT (By licensed PT), OT (By licensed OT)     PT Frequency: 5x week OT Frequency: 5x week            Contractures Contractures Info: Not present    Additional Factors Info  Code Status, Allergies Code Status Info: DNR Allergies Info: Penicillins, Sulfonamide Derivatives           Current Medications (09/27/2022):  This is the current hospital active medication list Current Facility-Administered Medications  Medication Dose Route Frequency Provider Last Rate Last Admin   0.9 %  sodium chloride infusion   Intravenous Continuous Multani, Bhupinder, MD 75 mL/hr at 09/27/22 1533 New Bag at 09/27/22 1533   acetaminophen (TYLENOL) tablet 650 mg  650 mg Oral Q6H PRN Quintella Baton, MD   650 mg at 09/27/22 1505   Or   acetaminophen (TYLENOL) suppository 650 mg  650 mg Rectal Q6H PRN Crosley, Debby, MD       donepezil (ARICEPT) tablet 5 mg  5 mg Oral QHS Crosley, Debby, MD   5 mg at 09/26/22 2006   feeding supplement (ENSURE ENLIVE / ENSURE PLUS) liquid 237 mL  237 mL Oral TID BM Campbell Riches, MD       ketoconazole (NIZORAL) 2 % cream   Topical Daily [name], MD   Given at 09/27/22 1039   lidocaine (LIDODERM) 5 % 1  patch  1 patch Transdermal Q24H [name], MD   1 patch at 09/27/22 1503   lip balm (BLISTEX) ointment   Topical PRN Reome, Earle J, RPH       metoprolol tartrate (LOPRESSOR) tablet 25 mg  25 mg Oral BID Claria Dice, Debby, MD   25 mg at 09/27/22 1018   multivitamin with minerals tablet 1 tablet  1 tablet Oral Daily Campbell Riches, MD   1 tablet at 09/27/22 1505   nystatin cream (MYCOSTATIN)   Topical BID Campbell Riches, MD   Given at 09/27/22 1511   ondansetron (ZOFRAN) tablet 4 mg  4 mg Oral Q6H PRN Quintella Baton, MD       Or   ondansetron  (ZOFRAN) injection 4 mg  4 mg Intravenous Q6H PRN Crosley, Debby, MD       pravastatin (PRAVACHOL) tablet 20 mg  20 mg Oral Daily Crosley, Debby, MD   20 mg at 09/27/22 1011   rivaroxaban (XARELTO) tablet 10 mg  10 mg Oral Daily Crosley, Debby, MD   10 mg at 09/27/22 1011   senna-docusate (Senokot-S) tablet 1 tablet  1 tablet Oral QHS PRN Crosley, Debby, MD       sodium chloride flush (NS) 0.9 % injection 10-40 mL  10-40 mL Intracatheter Q12H Campbell Riches, MD   20 mL at 09/27/22 1100   sodium chloride flush (NS) 0.9 % injection 10-40 mL  10-40 mL Intracatheter PRN Campbell Riches, MD       sodium zirconium cyclosilicate (LOKELMA) packet 10 g  10 g Oral Once Teola Bradley, MD         Discharge Medications: Please see discharge summary for a list of discharge medications.  Relevant Imaging Results:  Relevant Lab Results:   Additional Information SSN: [id number].  Pt is vaccinated for covid with at least one booster per husband.  [name], LCSW

## 2022-09-27 NOTE — Progress Notes (Signed)
Daily Progress Note   Patient Name: Judith [name]       Date: 09/27/2022 DOB: [birth year]  Age: 85 y.o. MRN#: [id number] Attending Physician: Campbell Riches, MD Primary Care Physician: Axel Filler, MD Admit Date: 09/24/2022  Reason for Consultation/Follow-up: Establishing goals of care  Subjective: Medical records reviewed including progress notes, labs, imaging. Patient assessed at the bedside.  She reports mild soreness over her back and otherwise doing well, in good spirits. Her husband Kasandra Knudsen is present at the bedside and we relocated to [place] room for scheduled family meeting. Discussed with dietician.  Created space and opportunity for husband's thoughts and feelings on patient's current illness.  We discussed that she is still eating very little during this admission even with ongoing management of her electrolyte deficiencies, A-fib, kidney injury.  We discussed her poor overall prognosis and that she is hospice eligible at this time if aligned with goals of care.  Hospice philosophy and resources available in different settings were explained and offered.  The SNF referral process was also explained in detail.  He thinks that she will need to go to SNF, as he cannot take care of her by himself if she is unable to walk.  He seems hesitant to take her home with limited support from hospice but is willing to try if this is the best option for her to avoid suffering.  Emotional support and therapeutic listening was provided.  He is in disbelief that things could go so poorly in just 2 months when she was caring for him prior to this.  The risks and benefits of artificial nutrition were explained in detail.  Recommended against this intervention given extensive research in  patients with dementia and evidence that there is no benefit from artificial nutrition, but rather patient's have increased risk of worsening quality of life and complications.  He agrees and wishes to avoid this.  We then returned to the bedside and discussed with the patient and she does not want a feeding tube.  She would like to keep trying to eat on her own.  Kasandra Knudsen would like to hear recommendations from dietitian, although he does not have very high hopes that this will be impactful or that her intake will improve.  I shared with him that I worry about this as well.  A MOST form was introduced and extensive conversation was had, covering concepts specific to code status, artifical feeding and hydration, continued IV antibiotics and rehospitalization.  He would like to review this further tonight and meet again tomorrow for assistance with completion.  Questions and concerns addressed. PMT will continue to support holistically.   Length of Stay: 3  Physical Exam Vitals and nursing note reviewed.  Constitutional:      General: She is not in acute distress. Cardiovascular:     Rate and Rhythm: Normal rate.  Pulmonary:     Effort: Pulmonary effort is normal.  Skin:    General: Skin is cool and dry.  Neurological:     Mental Status: She is alert. Mental status is at baseline.  Psychiatric:        Behavior: Behavior is cooperative.   Vital Signs: BP 124/82 (BP Location: Right Arm)   Pulse 67   Temp 98.3 F (36.8 C)   Resp 17   Ht '5\' 7"'$  (1.702 m)   Wt 80.2 kg   SpO2 98%   BMI 27.69 kg/m  SpO2: SpO2: 98 % O2 Device: O2 Device: Room Air O2 Flow Rate:        Palliative Assessment/Data: 20%    Palliative Care Assessment & Plan   Patient Profile: 86 y.o. female  with past medical history of severe Alzheimer's dementia, atrial fibrillation, factor V Leyden deficiency, PE and DVTs  admitted on 09/24/2022 with confusion, poor oral intake.    Patient admitted for A-fib with RVR,  hyponatremia, AKI due to dehydration.  PMT has been consulted to assist with goals of care conversation.  Assessment: Goals of care conversation A-fib with RVR Alzheimer's dementia, progressing  Recommendations/Plan: Continue DNR Continue current care Patient and family would not want a feeding tube if she was unable to increase oral intake on her own Patient's husband is considering SNF versus home with hospice PMT will meet again with husband tomorrow at 19 PM for ongoing goals of care discussions Psychosocial and emotional support provided PMT will continue to follow and support   Prognosis: Poor long-term prognosis, failure to thrive and rapid cognitive/nutritional/functional decline over the past 2 months  Discharge Planning: To Be Determined  Care plan was discussed with patient, patient's husband, IMTS, TOC, dietitian   MDM high         [place], PA-C  Palliative Medicine Team Team phone # [phone]  Thank you for allowing the Palliative Medicine Team to assist in the care of this patient. Please utilize secure chat with additional questions, if there is no response within 30 minutes please call the above phone number.  Palliative Medicine Team providers are available by phone from 7am to 7pm daily and can be reached through the team cell phone.  Should this patient require assistance outside of these hours, please call the patient's attending physician.

## 2022-09-27 NOTE — Consult Note (Signed)
[facility] Nurse Consult Note:\Patient with dementia admitted for fall at home and confusion.  There are multiple bruises to arms and buttocks area.  Denuded skin to coccyx.  Denuded rash beneath left breast.  Reason for Consult:Skin breakdown to left breast.  Sacrococcygeal nonintact skin with red satellite lesions to periphery.  Bruising across buttocks and upper posterior thighs Wound type:moisture and pressure/trauma Pressure Injury POA: Yes Measurement: Sacrum:  5 cm x 5 cm reddened area with nonintact center.   Deep maroon bruising to bilateral buttocks and posterior upper thighs.  Consistent with being "down" for a prolonged time after a fall.  Abrasions, callouses and redness to bony prominences of bilateral feet.  Heel dressings in place.  Wound bed: Red and moist with satellite lesions along periphery consistent with fungal overgrowth.  Drainage (amount, consistency, odor)  None Periwound:intact  skin frequently exposed to incontinence.  Dressing procedure/placement/frequency:Interdry ([place] # [phone]  to left breast fold:  Measure and cut length of InterDry to fit in skin folds that have skin breakdown Tuck InterDry fabric into skin folds in a single layer, allow for 2 inches of overhang from skin edges to allow for wicking to occur May remove to bathe; dry area thoroughly and then tuck into affected areas again Do not apply any creams or ointments when using InterDry DO NOT THROW AWAY FOR 5 DAYS unless soiled with stool DO NOT [facility] product, this will inactivate the silver in the material  New sheet of Interdry should be applied after 5 days of use if patient continues to have skin breakdown    Cleanse sacral wound with NS and pat dry.  Apply Nystatin cream twice daily and PRN soilage Pad and protect bruising to bony prominences. Offload pressure to heels.  Will not follow at this time.  Please re-consult if needed.  Estrellita Ludwig MSN, RN, FNP-BC CWON Wound, Ostomy, Continence  Nurse [facility] [phone] Pager [phone]

## 2022-09-28 DIAGNOSIS — R52 Pain, unspecified: Secondary | ICD-10-CM

## 2022-09-28 DIAGNOSIS — E43 Unspecified severe protein-calorie malnutrition: Secondary | ICD-10-CM

## 2022-09-28 DIAGNOSIS — Z789 Other specified health status: Secondary | ICD-10-CM

## 2022-09-28 DIAGNOSIS — R627 Adult failure to thrive: Secondary | ICD-10-CM | POA: Diagnosis not present

## 2022-09-28 DIAGNOSIS — Z66 Do not resuscitate: Secondary | ICD-10-CM

## 2022-09-28 DIAGNOSIS — I482 Chronic atrial fibrillation, unspecified: Secondary | ICD-10-CM | POA: Diagnosis not present

## 2022-09-28 DIAGNOSIS — R638 Other symptoms and signs concerning food and fluid intake: Secondary | ICD-10-CM

## 2022-09-28 DIAGNOSIS — E872 Acidosis, unspecified: Secondary | ICD-10-CM

## 2022-09-28 DIAGNOSIS — Z515 Encounter for palliative care: Secondary | ICD-10-CM

## 2022-09-28 DIAGNOSIS — N179 Acute kidney failure, unspecified: Secondary | ICD-10-CM | POA: Diagnosis not present

## 2022-09-28 DIAGNOSIS — R451 Restlessness and agitation: Secondary | ICD-10-CM

## 2022-09-28 DIAGNOSIS — E871 Hypo-osmolality and hyponatremia: Secondary | ICD-10-CM | POA: Diagnosis not present

## 2022-09-28 LAB — BASIC METABOLIC PANEL
Anion gap: 15 (ref 5–15)
BUN: 21 mg/dL (ref 8–23)
CO2: 16 mmol/L — ABNORMAL LOW (ref 22–32)
Calcium: 8.9 mg/dL (ref 8.9–10.3)
Chloride: 95 mmol/L — ABNORMAL LOW (ref 98–111)
Creatinine, Ser: 1.21 mg/dL — ABNORMAL HIGH (ref 0.44–1.00)
GFR, Estimated: 44 mL/min — ABNORMAL LOW (ref >60)
Glucose, Bld: 110 mg/dL — ABNORMAL HIGH (ref 70–99)
Potassium: 5 mmol/L (ref 3.5–5.1)
Sodium: 126 mmol/L — ABNORMAL LOW (ref 135–145)

## 2022-09-28 MED ORDER — HYDROMORPHONE HCL 1 MG/ML PO LIQD: 2.0000 mg | ORAL | Status: DC | PRN

## 2022-09-28 MED ORDER — LORAZEPAM 2 MG/ML IJ SOLN: 1.0000 mg | INTRAMUSCULAR | Status: DC | PRN

## 2022-09-28 MED ORDER — GLYCOPYRROLATE 0.2 MG/ML IJ SOLN: 0.2000 mg | INTRAMUSCULAR | Status: DC | PRN

## 2022-09-28 MED ORDER — ENSURE ENLIVE PO LIQD: 237.0000 mL | ORAL | Status: DC | PRN

## 2022-09-28 MED ORDER — BIOTENE DRY MOUTH MT LIQD
15.0000 mL | Freq: Two times a day (BID) | OROMUCOSAL | Status: DC
  Administered 2022-09-28 – 2022-09-29 (×3): 15 mL via TOPICAL

## 2022-09-28 MED ORDER — LORAZEPAM 2 MG/ML PO CONC: 1.0000 mg | ORAL | Status: DC | PRN

## 2022-09-28 MED ORDER — HYDROMORPHONE HCL 1 MG/ML PO LIQD
2.0000 mg | ORAL | Status: DC
  Administered 2022-09-28 – 2022-09-29 (×5): 2 mg via ORAL
  Filled 2022-09-28 (×5): qty 2

## 2022-09-28 MED ORDER — HYDROMORPHONE HCL 1 MG/ML IJ SOLN: 0.5000 mg | INTRAMUSCULAR | Status: DC | PRN

## 2022-09-28 MED ORDER — POLYVINYL ALCOHOL 1.4 % OP SOLN: 1.0000 [drp] | Freq: Four times a day (QID) | OPHTHALMIC | Status: DC | PRN

## 2022-09-28 MED ORDER — GLYCOPYRROLATE 1 MG PO TABS: 1.0000 mg | ORAL_TABLET | ORAL | Status: DC | PRN

## 2022-09-28 MED ORDER — HALOPERIDOL LACTATE 2 MG/ML PO CONC: 2.0000 mg | Freq: Four times a day (QID) | ORAL | Status: DC | PRN

## 2022-09-28 MED ORDER — DIPHENHYDRAMINE HCL 25 MG PO CAPS: 25.0000 mg | ORAL_CAPSULE | Freq: Four times a day (QID) | ORAL | Status: DC | PRN

## 2022-09-28 MED ORDER — HYDROMORPHONE HCL 1 MG/ML IJ SOLN: 0.5000 mg | INTRAMUSCULAR | Status: DC

## 2022-09-28 MED ORDER — DIPHENHYDRAMINE HCL 50 MG/ML IJ SOLN: 12.5000 mg | INTRAMUSCULAR | Status: DC | PRN

## 2022-09-28 MED ORDER — HALOPERIDOL 1 MG PO TABS: 2.0000 mg | ORAL_TABLET | Freq: Four times a day (QID) | ORAL | Status: DC | PRN

## 2022-09-28 MED ORDER — HALOPERIDOL LACTATE 5 MG/ML IJ SOLN: 2.0000 mg | Freq: Four times a day (QID) | INTRAMUSCULAR | Status: DC | PRN

## 2022-09-28 MED ORDER — DIPHENHYDRAMINE HCL 50 MG/ML IJ SOLN: 25.0000 mg | Freq: Four times a day (QID) | INTRAMUSCULAR | Status: DC | PRN

## 2022-09-28 MED ORDER — LORAZEPAM 1 MG PO TABS: 1.0000 mg | ORAL_TABLET | ORAL | Status: DC | PRN

## 2022-09-28 NOTE — Plan of Care (Signed)
  Problem: Clinical Measurements: Goal: Will remain free from infection Outcome: Not Progressing Goal: Diagnostic test results will improve Outcome: Not Progressing Goal: Respiratory complications will improve Outcome: Not Progressing   Problem: Education: Goal: Knowledge of the prescribed therapeutic regimen will improve Outcome: Not Progressing   Problem: Coping: Goal: Ability to identify and develop effective coping behavior will improve Outcome: Not Progressing   Problem: Clinical Measurements: Goal: Quality of life will improve Outcome: Not Progressing   Problem: Respiratory: Goal: Verbalizations of increased ease of respirations will increase Outcome: Not Progressing   Problem: Role Relationship: Goal: Family's ability to cope with current situation will improve Outcome: Not Progressing Goal: Ability to verbalize concerns, feelings, and thoughts to partner or family member will improve Outcome: Not Progressing   Problem: Pain Management: Goal: Satisfaction with pain management regimen will improve Outcome: Not Progressing

## 2022-09-28 NOTE — Progress Notes (Signed)
Manufacturing [employer] [facility]) Hospital Liaison Note  Received request from Transitions of Care Manager PMT provider/Amber for family interest in [facility], TOC/Greg aware.    Unfortunately, several attempts made to reach husband/Daniel to confirm interest with no response. Voicemail left for Quillian Quince to contact MSW to confirm interest to initiate referral.   Patient is not under review for [facility] services pending family consent.     Please call with any questions/concerns.    Thank you for the opportunity to participate in this patient's care.   Daphene Calamity, MSW [facility] Liaison  [phone]

## 2022-09-28 NOTE — Progress Notes (Addendum)
HD#5 Subjective:   Summary: Judith [name] is a 86 year old female with PMH of Alzheimer's demenia, atrial fibrillation, factor V Leiden deficiency, PE and DVTs on xarelto who was brought to the ED by husband for poor functional status, decrease PO intake admitted for Afib with FVR and hyponatremia.    Overnight Events: Patient appeared to be confused and agitated and tried to remove the leads for telemetry monitor.  She also tried to her IV line and tried to get out of bed.  Patient appears confused and more cognitively impaired this morning.  She was only oriented to self.  She is poorly eating and drinking. It was witnessed that patient had trouble swallowing Ensure.  Objective:  Vital signs in last 24 hours: Vitals:   09/27/22 1915 09/28/22 0340 09/28/22 0457 09/28/22 0803  BP: 112/85   121/77  Pulse: (!) 110     Resp: 19   20  Temp: 98 F (36.7 C)  97.6 F (36.4 C) 97.7 F (36.5 C)  TempSrc: Oral  Axillary Axillary  SpO2: 96%  94% 98%  Weight:  79.2 kg    Height:       Supplemental O2: Room Air SpO2: 98 %   Physical Exam:  Constitutional: Chronically ill appearing. Not in acute distress. Skin dry with poor turgor.  Poor p.o. intake.  It appears that patient have slightly delayed response with swallowing.   HENT: normocephalic atraumatic, mucous membranes moist Eyes: conjunctiva non-erythematous Neck: supple Cardiovascular: regular rate and rhythm, no m/r/g Pulmonary/Chest: normal work of breathing on room air, lungs clear to auscultation bilaterally Abdominal: Mild diffused tenderness to lower abdomen.   Neurological: alert & oriented to self Moving all extremities.  Skin: warm and dry, poor skin turgor Psych: Appears confused and oriented only to self.   Filed Weights   09/25/22 1950 09/27/22 0438 09/28/22 0340  Weight: 79.1 kg 80.2 kg 79.2 kg     Intake/Output Summary (Last 24 hours) at 09/28/2022 1344 Last data filed at 09/28/2022 0810 Gross per 24 hour   Intake 289.3 ml  Output 850 ml  Net -560.7 ml   Net IO Since Admission: 1,204.96 mL [09/28/22 1344]  Pertinent Labs:    Latest Ref Rng & Units 09/27/2022    7:04 AM 09/25/2022    6:15 PM 09/24/2022    6:50 PM  CBC  WBC 4.0 - 10.5 K/uL 10.0  9.8  10.6   Hemoglobin 12.0 - 15.0 g/dL 13.7  12.8  13.8   Hematocrit 36.0 - 46.0 % 40.3  36.6  39.8   Platelets 150 - 400 K/uL 263  225  303        Latest Ref Rng & Units 09/28/2022    5:50 AM 09/27/2022    9:12 PM 09/27/2022   12:26 PM  CMP  Glucose 70 - 99 mg/dL 110  115  76   BUN 8 - 23 mg/dL '21  18  17   '$ Creatinine 0.44 - 1.00 mg/dL 1.21  1.18  1.03   Sodium 135 - 145 mmol/L 126  126  129   Potassium 3.5 - 5.1 mmol/L 5.0  4.9  5.5   Chloride 98 - 111 mmol/L 95  92  94   CO2 22 - 32 mmol/L '16  17  18   '$ Calcium 8.9 - 10.3 mg/dL 8.9  9.0  9.1     Imaging: No results found.  Assessment/Plan:   Principal Problem:   Chronic atrial fibrillation with RVR (  [place]) Active Problems:   Factor V Leiden, prothrombin gene mutation ([place])   Essential hypertension   Long term current use of anticoagulant   Hyperlipidemia   AKI (acute kidney injury) ([place])   Alzheimer's dementia ([place])   Hyponatremia   Hypokalemia   Anorexia   Acute hyponatremia   Protein-calorie malnutrition, severe   Hyperkalemia   Patient Summary:  Hyponatremia Hyperkalemia # Failure to thrive # Severe malnutrition Patient has poor p.o. intake for past 2 weeks.  Hyponatremia is likely in the setting of low p.o. solute intake.  Hyponatremia was corrected with IV normal saline.  Patient's sodium 129 yesterday.  Patient was confused and agitated last night and pulled her IV line which is resulted with cessation of IV hydration.  Patient's sodium dropped to 126.  Patient's potassium was 5.5 yesterday.  Patient was treated with Lokelma 10 g.  Potassium within normal limit at 5.0.  Patient is more confused today.  Poor p.o. intake is also from worsening dementia.  We  will hold to establish IV access at this time.  We will repeat BMP later today and reassess if patient needs to have IV access for IV hydration.   Patient was also evaluated by dietitian and diagnosed with severe malnutrition.  Started Ensure 3 times daily which patient is poorly ingesting.  Palliative care team and social worker spoke with the husband.  He is agreeable to residential hospice referral, requesting [facility] as it is 5 minutes from their home. He is also open to [place] if not accepted at [facility]. Discussed stopping all unnecessary measures such as blood draws, needle sticks, oxygen, antibiotics, CBGs/insulin, cardiac monitoring, IVF, and frequent vital signs.  -Speech and swallow evaluation was requested. -Check BMP -encourage increase PO intake.   Atrial fibrillation Patient was in A-fib with RVR with heart rate around 120-130 upon arrival.  Patient is on metoprolol 25 mg twice daily. Pt is tachycardic with HR between 100-110. Echo with estimated EF of 45 to 50% with no left ventricle regional wall motion abnormalities (09/27/22) - Continue metoprolol 25 mg twice daily. - continue xarelto   Dementia Failure to thrive Worsening memory and functional status after fall. Palliative care discussion with the husband and agree to hospice with poor overall prognosis. - continue donepizil -PT -Palliative making arrangements referral for hospice and other [place]   Urinary Retention: Abdomen soft, diffusely tender to light palpation to lower abdomen.  Mild guarding over suprapubic area.  No rebound tenderness.  No pulsation or mass appreciated.  Bladder scan was ordered which revealed 587 mL of urine in the bladder.  In and out catheterization was done to remove the urine from the bladder and to relieve urinary retention. -Bladder scan every 8 hour.   Electrolyte abnormalities Hypomagnesemia and hypokalemia now resolved.   Lumbar back pain L1 compression  fracture after rolling out of bed in September.  -PT -Tylenol, lidocaine patch    Sacral pressure ulcer Superficial skin break to sacrum with mild surrounding redness.  Wound care evaluated the patient and recommendations were made.   -InterDry dressing to sacrum and left breast fold -Offload pressure from heel-Frequent repositioning -Sacral foam dressing to prevent further skin break -Ensure Enlive will also help improve nutritional status. -Encouraged to keep the area clean.   HTN: Blood pressure at goal. -Continue metoprolol 25 mg BID (holding home HCTZ and lisinopril)   HLD -Continue pravastatin  Prior to Admission Living Arrangement: Home Anticipated Discharge Location: TBD after palliative  care make bed arrangement at SNF (Husband requesting [facility] if not accepted at [facility]). Barriers to Discharge: Bed availability at [facility]   Teola Bradley, MD Internal Medicine Resident PGY-1 Pager: [phone] Please contact the on call pager after 5 pm and on weekends at [phone].

## 2022-09-28 NOTE — Plan of Care (Signed)
  Problem: Clinical Measurements: Goal: Ability to maintain clinical measurements within normal limits will improve Outcome: Not Applicable Goal: Will remain free from infection Outcome: Progressing Goal: Diagnostic test results will improve Outcome: Progressing Goal: Respiratory complications will improve Outcome: Progressing Goal: Cardiovascular complication will be avoided Outcome: Not Applicable   Problem: Health Behavior/Discharge Planning: Goal: Ability to manage health-related needs will improve Outcome: Not Applicable   Problem: Education: Goal: Knowledge of General Education information will improve Description: Including pain rating scale, medication(s)/side effects and non-pharmacologic comfort measures Outcome: Not Applicable

## 2022-09-28 NOTE — Evaluation (Signed)
Clinical/Bedside Swallow Evaluation Patient Details  Name: Judith [name] MRN: [id number] Date of Birth: May 08, 1936  Today's Date: 09/28/2022 Time: SLP Start Time (ACUTE ONLY): 6295 SLP Stop Time (ACUTE ONLY): 2841 SLP Time Calculation (min) (ACUTE ONLY): 16 min  Past Medical History:  Past Medical History:  Diagnosis Date   Adenocarcinoma of uterus ([place]) 85   DVT (deep vein thrombosis) in pregnancy    Both lower extremities   Heterozygous factor V Leiden mutation ([place])    Hyperlipidemia    Hypertension    Osteoporosis 06/2014   T score -2.6 left forearm stable from prior DEXA off Fosamax   Pulmonary embolus ([place]) 2007   and DVT, on chronic anti-coagulation   Stress incontinence, female    Past Surgical History:  Past Surgical History:  Procedure Laterality Date   CLEFT PALATE REPAIR     COLONOSCOPY  09/22/2011   Procedure: COLONOSCOPY;  Surgeon: Juanita Craver, MD;  Location: [facility];  Service: Endoscopy;  Laterality: N/A;   HAMMER TOE SURGERY  1990-91   3 toes each foot   nasal septum repair     TUBAL LIGATION     vaginectomy     HPI:  Pt is an 86 y.o. female admitted 11/17 with  a fib with RVR and hyponatremia. Husband decided to pursue full comfort care 11/21 and comfort diet was initiated. Pt was also observed by staff to be having trouble swallowing her Ensure. Recent fall in Sept 2023 sustaining L1 comp fx. PMH:  Alzeimer's demenia, atrial fibrillation, factor V leiden, PE and DVTs on chronic anticoagulation, cleft palate repair, nasal septum repair    Assessment / Plan / Recommendation  Clinical Impression  Pt was seen for swallow evaluation at the request of MD, with patient/husband pursuing full comfort care including comfort diet that has already been initiated. Dry mouth noted without overt suspicion for cranial nerve impairment. Pt appears to have reduced awareness when it comes to POs being offered. She nods her head "yes" when asked if she wants something, but  then is a little resistant when something is brought to her lips. She accepted very little POs - a few small spoonfuls/sips of an Ensure and a tiny bite of mashed potatoes. There is baseline throat clearing noted, with throat clearing noted across both of these consistencies. Neither consistency appeared to elicit more overt coughing. In light of comfort focused feeding, would recommend the following to promote comfort and reduce (but not eliminate) risk of aspiration:  Provide frequent oral care as pt will allow Position pt as upright as possible for POs - may need to utilize reverse Trendelenburg given pain level Consider medications via alternate routes or sublingually  Provide full assistance during meals with use of careful hand feeding Follow pt's lead with PO intake - do not need to push POs for nutrition/hydration, but can offer if she is asking for anything specifically Can provide small amounts of liquids via swab to oral cavity for moisture and taste Cognitively, pt may benefit from gravitating toward softer or more pureed types of consistencies   SLP Visit Diagnosis: Dysphagia, unspecified (R13.10)      Moderate aspiration risk;Risk for inadequate nutrition/hydration    Diet Recommendation Other (Comment) (comfort feeds)   Liquid Administration via: Spoon;Cup;Straw Medication Administration: Via alternative means Supervision: Full supervision/cueing for compensatory strategies;Staff to assist with self feeding Compensations: Minimize environmental distractions;Slow rate;Small sips/bites Postural Changes: Seated upright at 90 degrees (as upright as possible)    Other  Recommendations Oral Care  Recommendations: Oral care QID;Staff/trained caregiver to provide oral care Other Recommendations: Have oral suction available    Recommendations for follow up therapy are one component of a multi-disciplinary discharge planning process, led by the attending physician.  Recommendations may  be updated based on patient status, additional functional criteria and insurance authorization.  Follow up Recommendations No SLP follow up      Assistance Recommended at Discharge    Functional Status Assessment Patient has had a recent decline in their functional status and/or demonstrates limited ability to make significant improvements in function in a reasonable and predictable amount of time  Frequency and Duration            Prognosis Prognosis for Safe Diet Advancement: Guarded Barriers to Reach Goals: Other (Comment) (comfort care)      Swallow Study   General HPI: Pt is an 86 y.o. female admitted 11/17 with  a fib with RVR and hyponatremia. Husband decided to pursue full comfort care 11/21 and comfort diet was initiated. Pt was also observed by staff to be having trouble swallowing her Ensure. Recent fall in Sept 2023 sustaining L1 comp fx. PMH:  Alzeimer's demenia, atrial fibrillation, factor V leiden, PE and DVTs on chronic anticoagulation, cleft palate repair, nasal septum repair Type of Study: Bedside Swallow Evaluation Previous Swallow Assessment: none in chart Diet Prior to this Study: Regular;Thin liquids;Other (Comment) (for comfort) Temperature Spikes Noted: No Respiratory Status: Room air History of Recent Intubation: No Behavior/Cognition: Lethargic/Drowsy Oral Cavity Assessment: Dry (evidence of cleft lip repair) Oral Care Completed by SLP: No Oral Cavity - Dentition: Adequate natural dentition Self-Feeding Abilities: Total assist Patient Positioning: Partially reclined (as upright as can be tolerated, utilized reverse trendelenburg) Baseline Vocal Quality: Normal Volitional Cough: Cognitively unable to elicit Volitional Swallow: Able to elicit    Oral/Motor/Sensory Function     Ice Chips Ice chips: Not tested   Thin Liquid Thin Liquid: Impaired Presentation: Spoon;Straw Oral Phase Impairments: Poor awareness of bolus Pharyngeal  Phase Impairments:  Throat Clearing - Immediate;Throat Clearing - Delayed;Suspected delayed Swallow    Nectar Thick Nectar Thick Liquid: Not tested   Honey Thick Honey Thick Liquid: Not tested   Puree Puree: Impaired Presentation: Spoon Oral Phase Impairments: Poor awareness of bolus Pharyngeal Phase Impairments: Suspected delayed Swallow;Throat Clearing - Immediate;Throat Clearing - Delayed   Solid     Solid: Not tested      Osie Bond., M.A. [place] Office [phone]  Secure chat preferred  09/28/2022,3:19 PM

## 2022-09-28 NOTE — Progress Notes (Addendum)
Daily Progress Note   Patient Name: Judith [name]       Date: 09/28/2022 DOB: [birth year]  Age: 86 y.o. MRN#: [id number] Attending Physician: Campbell Riches, MD Primary Care Physician: Axel Filler, MD Admit Date: 09/24/2022  Reason for Consultation/Follow-up: Establishing goals of care  Subjective: Chart review performed. Received report from primary RN - no acute concerns. RN states patient has pain especially with movement, difficulty swallowing/coughing, urinary retention requiring in/out cath, poor oral intake (only a couple bites of ice cream).   12:00 PM Went to visit patient at bedside for scheduled 12p meeting with husband - husband/Daniel not present. Patient was lying in bed awake, alert, confused, able to participate in minimal conversation. Signs and non-verbal gestures of pain and discomfort noted - coughing, moaning. No respiratory distress or increased work of breathing; secretions noted. Patient is weak and frail appearing.  12:05 PM Attempted to call husband/Daniel - no answer - confidential voicemail left and PMT phone number provided with request to return call.  12:10 PM Found husband/Daniel in 5N waiting room.   Met in 5N conference room for continued [place].  Emotional support provided to [facility]. He tells me patient's clinical status has "gotten worse," stating she "stays agitated." He says "it's hard to see her this way" and he "wants her to be comfortable." He reiterates he is not able to take her home to provided care in her current state. Prognostication was reviewed.   Quillian Quince is agreeable to residential hospice referral, requesting [facility] as it is 5 minutes from their home. He is also open to [place] if not accepted at [facility].  Quillian Quince feels patient would not benefit from rehab, which I agree.  We talked about transition to comfort measures in house and what that would entail inclusive of medications to control pain, dyspnea, agitation, nausea, and itching. We discussed stopping all unnecessary measures such as blood draws, needle sticks, oxygen, antibiotics, CBGs/insulin, cardiac monitoring, IVF, and frequent vital signs. Education provided that other non-pharmacological interventions would be utilized for holistic support and comfort such as spiritual support if requested, repositioning, music therapy, offering comfort feeds, and/or therapeutic listening. He is agreeable for transition to full comfort measures today.  Therapeutic listening and emotional support provided as Quillian Quince reflects over the last 9 weeks since patient's fall  and how she has declined. He also tells me about her wishes to be cremated and funeral plans.  All questions and concerns addressed. Encouraged to call with questions and/or concerns. PMT card provided.  Length of Stay: 4  Current Medications: Scheduled Meds:   donepezil  5 mg Oral QHS   feeding supplement  237 mL Oral TID BM   ketoconazole   Topical Daily   lidocaine  1 patch Transdermal Q24H   metoprolol tartrate  25 mg Oral BID   multivitamin with minerals  1 tablet Oral Daily   nystatin cream   Topical BID   pravastatin  20 mg Oral Daily   rivaroxaban  10 mg Oral Daily   sodium chloride flush  10-40 mL Intracatheter Q12H    Continuous Infusions:   PRN Meds: acetaminophen **OR** acetaminophen, lip balm, ondansetron **OR** ondansetron (ZOFRAN) IV, senna-docusate, sodium chloride flush  Physical Exam Vitals and nursing note reviewed.  Constitutional:      General: She is not in acute distress.    Appearance: She is ill-appearing.  Pulmonary:     Effort: No respiratory distress.  Skin:    General: Skin is warm and dry.  Neurological:     Mental Status: She is alert. She is  disoriented and confused.     Motor: Weakness present.  Psychiatric:        Mood and Affect: Mood is anxious.        Speech: Speech is delayed.        Behavior: Behavior is agitated.        Cognition and Memory: Cognition is impaired. Memory is impaired.             Vital Signs: BP 121/77 (BP Location: Right Arm)   Pulse (!) 110   Temp 97.7 F (36.5 C) (Axillary)   Resp 20   Ht _0  (1.702 m)   Wt 79.2 kg   SpO2 98%   BMI 27.35 kg/m  SpO2: SpO2: 98 % O2 Device: O2 Device: Room Air O2 Flow Rate:    Intake/output summary:  Intake/Output Summary (Last 24 hours) at 09/28/2022 1137 Last data filed at 09/28/2022 0345 Gross per 24 hour  Intake 289.3 ml  Output --  Net 289.3 ml   LBM: Last BM Date : 09/27/22 Baseline Weight: Weight: 79.1 kg Most recent weight: Weight: 79.2 kg       Palliative Assessment/Data: PPS 20%      Patient Active Problem List   Diagnosis Date Noted   Protein-calorie malnutrition, severe 09/27/2022   Hyperkalemia 09/27/2022   Hyponatremia 09/24/2022   Hypokalemia 09/24/2022   Anorexia 09/24/2022   Chronic atrial fibrillation with RVR ([place]) 09/24/2022   Acute hyponatremia 09/24/2022   Hearing loss 03/24/2020   Knee osteoarthritis 08/22/2017   Alzheimer's dementia ([place]) 10/24/2013   AKI (acute kidney injury) ([place]) 08/22/2012   Hyperlipidemia 03/16/2011   Preventative health care 03/16/2011   Long term current use of anticoagulant 11/29/2010   Factor V Leiden, prothrombin gene mutation ([place]) 06/19/2009   Essential hypertension 01/16/2007   Osteoporosis 01/16/2007    Palliative Care Assessment & Plan   Patient Profile: 86 y.o. female  with past medical history of severe Alzheimer's dementia, atrial fibrillation, factor V Leyden deficiency, PE and DVTs  admitted on 09/24/2022 with confusion, poor oral intake.    Patient admitted for A-fib with RVR, hyponatremia, AKI due to dehydration.  PMT has been consulted to assist with goals of care  conversation.  Assessment: Principal  Problem:   Chronic atrial fibrillation with RVR (HCC) Active Problems:   Factor V Leiden, prothrombin gene mutation ([place])   Essential hypertension   Long term current use of anticoagulant   Hyperlipidemia   AKI (acute kidney injury) ([place])   Alzheimer's dementia (HCC)   Hyponatremia   Hypokalemia   Anorexia   Acute hyponatremia   Protein-calorie malnutrition, severe   Hyperkalemia   Terminal care  Recommendations/Plan: Initiated full comfort measures Continue DNR/DNI as previously documented Husband requests [facility] evaluation - TOC consult placed; TOC and hospice liaison notified Added orders for EOL symptom management and to reflect full comfort measures, as well as discontinued orders that were not focused on comfort Continue palliative wound care Unrestricted visitation orders were placed per current [place] EOL visitation policy  Nursing to provide frequent assessments and administer PRN medications as clinically necessary to ensure EOL comfort PMT will continue to follow and support holistically  Symptom Management Scheduled dilaudid q4h; PRN doses for breakthrough pain/dyspnea/increased work of breathing/RR>25 Can consider increasing scheduled dilaudid from 0.5 to 60m q4hr and/or adding scheduled ativan 120mq4h pending symptoms Continue aricept while tolerating POs Tylenol PRN pain/fever Biotin twice daily Benadryl PRN itching Robinul PRN secretions Haldol PRN agitation/delirium Ativan PRN anxiety/seizure/sleep/distress Zofran PRN nausea/vomiting Liquifilm Tears PRN dry eye   Goals of Care and Additional Recommendations: Limitations on Scope of Treatment: Full Comfort Care  Code Status:    Code Status Orders  (From admission, onward)           Start     Ordered   09/25/22 0913  Do not attempt resuscitation (DNR)  Continuous       Question Answer Comment  In the event of cardiac or respiratory  ARREST Do not call a "code blue"   In the event of cardiac or respiratory ARREST Do not perform Intubation, CPR, defibrillation or ACLS   In the event of cardiac or respiratory ARREST Use medication by any route, position, wound care, and other measures to relive pain and suffering. May use oxygen, suction and manual treatment of airway obstruction as needed for comfort.      09/25/22 0912           Code Status History     Date Active Date Inactive Code Status Order ID Comments User Context   09/24/2022 2316 09/25/2022 0912 Full Code [id number] BatonMD ED       Prognosis:  < 2 weeks  [place] primary RN, patient's husband, attending team, [facility]   Thank you for allowing the Palliative Medicine Team to assist in the care of this patient.   Total Time 60 minutes Prolonged Time Billed  no       Greater than 50%  of this time was spent counseling and coordinating care related to the above assessment and plan.  AmLin LandsmanNP  Please contact Palliative Medicine Team phone at [phone] questions and concerns.   *Portions of this note are a verbal dictation therefore any spelling and/or grammatical errors are due to the "DrKirkvillene" system interpretation.

## 2022-09-28 NOTE — Progress Notes (Signed)
Patient does not have IV access, spoke with Jeanett Schlein, MD about the difficulty placing/maintaining an IV. Per MD IV access is not necessary at this time.

## 2022-09-28 NOTE — TOC Progression Note (Signed)
Transition of Care [facility]) - Progression Note    Patient Details  Name: Judith [name] MRN: [id number] Date of Birth: [birth year]  Transition [facility] Care [facility]) CM/SW Contact  [name], LCSW Phone Number: 09/28/2022, 11:22 AM  Clinical Narrative:    Bed offers and choice document placed in pt room.  Husband not there, unable to reach him by phone.    Expected Discharge Plan: [place] Barriers to Discharge: Continued Medical Work up, SNF Pending bed offer  Expected Discharge Plan and Services Expected Discharge Plan: [place] In-house Referral: Clinical Social Work   Post Acute Care Choice: [place] Living arrangements for the past 2 months: Single Family Home                                       Social Determinants of Health (SDOH) Interventions    Readmission Risk Interventions     No data to display

## 2022-09-29 DIAGNOSIS — N179 Acute kidney failure, unspecified: Secondary | ICD-10-CM | POA: Diagnosis not present

## 2022-09-29 DIAGNOSIS — I482 Chronic atrial fibrillation, unspecified: Secondary | ICD-10-CM | POA: Diagnosis not present

## 2022-09-29 DIAGNOSIS — E872 Acidosis, unspecified: Secondary | ICD-10-CM | POA: Diagnosis not present

## 2022-09-29 DIAGNOSIS — Z515 Encounter for palliative care: Secondary | ICD-10-CM | POA: Diagnosis not present

## 2022-09-29 DIAGNOSIS — E871 Hypo-osmolality and hyponatremia: Secondary | ICD-10-CM | POA: Diagnosis not present

## 2022-09-29 MED ORDER — HYDROMORPHONE HCL 1 MG/ML PO LIQD
4.0000 mg | ORAL | Status: DC
  Administered 2022-09-29 (×3): 4 mg via ORAL
  Filled 2022-09-29 (×3): qty 4

## 2022-09-29 NOTE — TOC Progression Note (Addendum)
Transition of Care [facility]) - Progression Note    Patient Details  Name: Judith [name] MRN: [id number] Date of Birth: 1936/03/25  [facility]) CM/SW Contact  [name], LCSW Phone Number: 09/29/2022, 12:07 PM  Clinical Narrative: CSW received message from [name].  She needs to meet with pt husband prior to presenting pt to MD to see if meets criteria for residential hospice.      1030: Husband in room, [name] notified, will see him shortly.   1445: Per [name], pt has been accepted for [facility], waiting on consents and then can arrange transport.   Expected Discharge Plan: [place] Barriers to Discharge: Continued Medical Work up, SNF Pending bed offer  Expected Discharge Plan and Services Expected Discharge Plan: [place] In-house Referral: Clinical Social Work   Post Acute Care Choice: [place] Living arrangements for the past 2 months: Single Family Home                                       Social Determinants of Health (SDOH) Interventions    Readmission Risk Interventions     No data to display

## 2022-09-29 NOTE — Discharge Instructions (Signed)
To facility:  Patient is being accepted to your facility on hospice. Please ensure patient is comfortable and patient does not need any lab draws, antibiotics, or any IV fluids. Please ensure the patient is comfortable. Hospice will be taking care of all the orders.

## 2022-09-29 NOTE — Progress Notes (Signed)
[facility]) Hospital Liaison Note  Received request from Elmer Picker, NP with the PMT for family interest in [facility]. Spoke with patient's husband, Quillian Quince, to confirm interest and explain services. Eligibility confirmed per [facility] MD. Quillian Quince is agreeable to transfer today.   Hospital and TOC aware.  RN, please call report to [phone] prior to patient leaving the unit. Please send signed and completed DNR with patient at discharge.   Thank you, Zigmund Gottron RN  [facility] Liaison [phone]

## 2022-09-29 NOTE — Progress Notes (Signed)
Report given to RN at [facility]. Discharge education given to Pt. Pt awaiting PTAR for transportation.

## 2022-09-29 NOTE — Progress Notes (Signed)
HD#5 Subjective:   Summary: Judith [name] is a 86 year old female with PMH of Alzheimer's demenia, atrial fibrillation, factor V Leiden deficiency, PE and DVTs on xarelto who was brought to the ED by husband for poor functional status, decrease PO intake admitted for Afib with FVR and hyponatremia.  During hospitalization, patient was transition to comfort care and will move to hospice care.   Overnight Events: No acute events overnight  Interval history includes patient was transition to comfort care yesterday.  No more IV lines, lab checks, or any other interventions at this time. Patient is sleeping upon my exam.  Patient has no concerns.  Patient is confused at times.  Patient is only oriented to self.  Husband is not at bedside.  Objective:  Vital signs in last 24 hours: Vitals:   09/28/22 0340 09/28/22 0457 09/28/22 0803 09/28/22 2147  BP:   121/77 97/76  Pulse:    (!) 105  Resp:   20 20  Temp:  97.6 F (36.4 C) 97.7 F (36.5 C) 98.1 F (36.7 C)  TempSrc:  Axillary Axillary Oral  SpO2:  94% 98% 95%  Weight: 79.2 kg     Height:       Supplemental O2: Room Air SpO2: 95 %   Physical Exam:  Constitutional: Chronically ill appearing. Not in acute distress. Skin dry with poor turgor. HENT: normocephalic atraumatic Eyes: conjunctiva non-erythematous Neck: supple Cardiovascular: regular rate and rhythm, no m/r/g Pulmonary/Chest: normal work of breathing on room air, lungs clear to auscultation bilaterally Neurological: alert & oriented x1 Skin: warm and dry, poor skin turgor Psych: Confused   Filed Weights   09/25/22 1950 09/27/22 0438 09/28/22 0340  Weight: 79.1 kg 80.2 kg 79.2 kg    No intake or output data in the 24 hours ending 09/29/22 1051 Net IO Since Admission: 1,204.96 mL [09/29/22 1051]  Pertinent Labs:    Latest Ref Rng & Units 09/27/2022    7:04 AM 09/25/2022    6:15 PM 09/24/2022    6:50 PM  CBC  WBC 4.0 - 10.5 K/uL 10.0  9.8  10.6    Hemoglobin 12.0 - 15.0 g/dL 13.7  12.8  13.8   Hematocrit 36.0 - 46.0 % 40.3  36.6  39.8   Platelets 150 - 400 K/uL 263  225  303        Latest Ref Rng & Units 09/28/2022    5:50 AM 09/27/2022    9:12 PM 09/27/2022   12:26 PM  CMP  Glucose 70 - 99 mg/dL 110  115  76   BUN 8 - 23 mg/dL '21  18  17   '$ Creatinine 0.44 - 1.00 mg/dL 1.21  1.18  1.03   Sodium 135 - 145 mmol/L 126  126  129   Potassium 3.5 - 5.1 mmol/L 5.0  4.9  5.5   Chloride 98 - 111 mmol/L 95  92  94   CO2 22 - 32 mmol/L '16  17  18   '$ Calcium 8.9 - 10.3 mg/dL 8.9  9.0  9.1     Imaging: No results found.  Assessment/Plan:   Principal Problem:   Chronic atrial fibrillation with RVR (HCC) Active Problems:   Factor V Leiden, prothrombin gene mutation ([place])   Essential hypertension   Long term current use of anticoagulant   Hyperlipidemia   AKI (acute kidney injury) ([place])   Alzheimer's dementia (HCC)   Hyponatremia   Hypokalemia   Anorexia   Acute hyponatremia  Severe protein-calorie malnutrition ([place])   Hyperkalemia   Failure to thrive in adult   Patient Summary:  This is a 86 year old female with a PMH of Alzheimer's dementia, atrial fibrillation, factor V Leiden deficiency, PE and DVTs on xarelto who was brought to the ED by husband for poor functional status, decrease PO intake admitted for Afib with FVR and hyponatremia.  During hospitalization, patient was transitiond to comfort care and will move to hospice care.  #Alzheimer's dementia #Failure to thrive #Hypertension #Hyperlipidemia #Hypovolemic hyponatremia #Severe malnutrition #Atrial fibrillation #Electrolyte abnormalities Given patient's failure to thrive, poor p.o. intake, and Alzheimer's dementia, long conversation was had with palliative care during patient's hospitalization, and decision to move to comfort care was made yesterday on 09/28/2022.  All interventions have been discontinued including IV antibiotics, labs, imaging, or fluids.   Being plan of care at this time is to ensure patient has the right modalities to keep her comfortable at this time in life.  Patient's husband understands what is going on, and spiritual care is available as needed for the patient's husband and the patient.  Palliative care is following.  Plan will be for patient to be moved to hospice care once placement is available. -Continue comfort measures -Tylenol 650 mg every 6 hours as needed for fever and pain -Benadryl 25 mg every 6 hours as needed -Ensure supplements -Robinul as needed -Haldol as needed -Dilaudid as needed -Ativan as needed -Zofran as needed -No fluids, no antibiotics, no lab checks -Await for placement   Prior to Admission Living Arrangement: Home Anticipated Discharge Location: TBD after palliative care make bed arrangement at SNF (Husband requesting [facility] if not accepted at [facility]). Barriers to Discharge: Bed availability at SNF   Leigh Aurora, DO Internal Medicine Resident PGY-1 Pager: [phone] Please contact the on call pager after 5 pm and on weekends at [phone].

## 2022-09-29 NOTE — Progress Notes (Signed)
Daily Progress Note   Patient Name: Judith [name]       Date: 09/29/2022 DOB: 30-Jul-1936  Age: 86 y.o. MRN#: [id number] Attending Physician: Campbell Riches, MD Primary Care Physician: Axel Filler, MD Admit Date: 09/24/2022  Reason for Consultation/Follow-up: Non pain symptom management, Pain control, Psychosocial/spiritual support, and Terminal Care  Subjective: Chart review performed. Received report from primary RN - no acute concerns. RN reports patient is with minimal oral intake - only small sips of water via syringe/sponge as she cannot tolerate straw.  Went to visit patient at bedside - spouse/Daniel present. Patient was lying in bed awake, alert, confused, requesting to be moved up in bed. No signs or non-verbal gestures of pain or discomfort noted. No respiratory distress, increased work of breathing, or secretions noted. Patient does endorse pain. Primary RN assisted with moving patient up in bed - movement seemed to cause significant pain - patient now moaning and continued generalized discomfort.   Emotional support provided to Quillian Quince - he is surprised to hear he missed [facility] calls yesterday. He tells me he left his cell phone at home this morning and needed to return home to get it - he will be back around 10a. He will be watching for [facility] calls today. Quillian Quince tells me that patient's pain and restlessness has improved since yesterday, but is still concerning. Discussed increasing scheduled opioid - education provided this may cause increased drowsiness. Reviewed the concept of being awake and uncomfortable vs sleepy and comfortable - he is in agreement he would prefer her to be comfortable.   All questions and concerns addressed. Encouraged to call with  questions and/or concerns. PMT card previously provided.  Informed TOC and [facility] liaisons patient's husband should be present at bedside again around 10am.  Length of Stay: 5  Current Medications: Scheduled Meds:   antiseptic oral rinse  15 mL Topical BID   donepezil  5 mg Oral QHS   HYDROmorphone HCl  2 mg Oral Q4H   ketoconazole   Topical Daily   lidocaine  1 patch Transdermal Q24H   nystatin cream   Topical BID    Continuous Infusions:   PRN Meds: acetaminophen **OR** acetaminophen, diphenhydrAMINE **OR** diphenhydrAMINE, feeding supplement, glycopyrrolate **OR** glycopyrrolate **OR** glycopyrrolate, haloperidol **OR** haloperidol **OR** haloperidol lactate, HYDROmorphone HCl, lip balm, LORazepam **OR** [DISCONTINUED] LORazepam **  OR** LORazepam, ondansetron **OR** ondansetron (ZOFRAN) IV, polyvinyl alcohol  Physical Exam Vitals and nursing note reviewed.  Constitutional:      General: She is not in acute distress.    Appearance: She is ill-appearing.  Pulmonary:     Effort: No respiratory distress.  Skin:    General: Skin is warm and dry.  Neurological:     Mental Status: She is alert. She is disoriented and confused.     Motor: Weakness present.  Psychiatric:        Behavior: Behavior is cooperative.        Cognition and Memory: Cognition is impaired. Memory is impaired.             Vital Signs: BP 97/76 (BP Location: Left Arm)   Pulse (!) 105   Temp 98.1 F (36.7 C) (Oral)   Resp 20   Ht '5\' 7"'$  (1.702 m)   Wt 79.2 kg   SpO2 95%   BMI 27.35 kg/m  SpO2: SpO2: 95 % O2 Device: O2 Device: Room Air O2 Flow Rate:    Intake/output summary: No intake or output data in the 24 hours ending 09/29/22 0829 LBM: Last BM Date : 09/27/22 Baseline Weight: Weight: 79.1 kg Most recent weight: Weight: 79.2 kg       Palliative Assessment/Data: PPS 20%      Patient Active Problem List   Diagnosis Date Noted   Failure to thrive in adult 09/28/2022   Severe  protein-calorie malnutrition ([place]) 09/27/2022   Hyperkalemia 09/27/2022   Hyponatremia 09/24/2022   Hypokalemia 09/24/2022   Anorexia 09/24/2022   Chronic atrial fibrillation with RVR ([place]) 09/24/2022   Acute hyponatremia 09/24/2022   Hearing loss 03/24/2020   Knee osteoarthritis 08/22/2017   Alzheimer's dementia ([place]) 10/24/2013   AKI (acute kidney injury) ([place]) 08/22/2012   Hyperlipidemia 03/16/2011   Preventative health care 03/16/2011   Long term current use of anticoagulant 11/29/2010   Factor V Leiden, prothrombin gene mutation ([place]) 06/19/2009   Essential hypertension 01/16/2007   Osteoporosis 01/16/2007    Palliative Care Assessment & Plan   Patient Profile: 86 y.o. female  with past medical history of severe Alzheimer's dementia, atrial fibrillation, factor V Leyden deficiency, PE and DVTs  admitted on 09/24/2022 with confusion, poor oral intake.    Patient admitted for A-fib with RVR, hyponatremia, AKI due to dehydration.  PMT has been consulted to assist with goals of care conversation.  Assessment: Principal Problem:   Chronic atrial fibrillation with RVR (H[facility]) Active Problems:   Factor V Leiden, prothrombin gene mutation ([place])   Essential hypertension   Long term current use of anticoagulant   Hyperlipidemia   AKI (acute kidney injury) ([place])   Alzheimer's dementia (H[facility])   Hyponatremia   Hypokalemia   Anorexia   Acute hyponatremia   Severe protein-calorie malnutrition (H[facility])   Hyperkalemia   Failure to thrive in adult   Terminal care  Recommendations/Plan: Continue full comfort measures Continue DNR/DNI as previously documented - durable DNR form completed and placed in shadow chart. Copy was made and will be scanned into Vynca/ACP tab Transfer to [facility] when bed available - evaluation pending Continue palliative wound care Ok to leave out IV Continue current comfort focused medication regimen with adjustments as noted below Nursing to provide  frequent assessments and administer PRN medications as clinically necessary to ensure EOL comfort  PMT will continue to follow and support holistically  Symptom Management Increased oral dilaudid from '2mg'$  to '4mg'$ . Can consider adding  scheduled ativan if anxiety/restlessness/agitation not improved with pain management (does seem to be improving). Continue scheduled dilaudid q4h; PRN doses for breakthrough pain/dyspnea/increased work of breathing/RR>25 Continue aricept while tolerating POs Tylenol PRN pain/fever Biotin twice daily Benadryl PRN itching Robinul PRN secretions Haldol PRN agitation/delirium Ativan PRN anxiety/seizure/sleep/distress Zofran PRN nausea/vomiting Liquifilm Tears PRN dry eye  Goals of Care and Additional Recommendations: Limitations on Scope of Treatment: Full Comfort Care  Code Status:    Code Status Orders  (From admission, onward)           Start     Ordered   09/28/22 1241  Do not attempt resuscitation (DNR)  Continuous       Question Answer Comment  In the event of cardiac or respiratory ARREST Do not call a "code blue"   In the event of cardiac or respiratory ARREST Do not perform Intubation, CPR, defibrillation or ACLS   In the event of cardiac or respiratory ARREST Use medication by any route, position, wound care, and other measures to relive pain and suffering. May use oxygen, suction and manual treatment of airway obstruction as needed for comfort.      09/28/22 1247           Code Status History     Date Active Date Inactive Code Status Order ID Comments User Context   09/25/2022 0912 09/28/2022 1247 DNR [id number]  Aline August, MD ED   09/24/2022 2316 09/25/2022 0912 Full Code [id number]  Quintella Baton, MD ED       Prognosis:  < 2 weeks  Discharge Planning: Hospice facility  Care plan was discussed with primary RN, patient's husband, TOC, hospice liaison, Dr. Johnnye Sima  Thank you for allowing the Palliative Medicine Team to  assist in the care of this patient.  Lin Landsman, NP  Please contact Palliative Medicine Team phone at [phone] for questions and concerns.   *Portions of this note are a verbal dictation therefore any spelling and/or grammatical errors are due to the "[place] [employer]" system interpretation.

## 2022-09-29 NOTE — Discharge Summary (Signed)
Name: Judith [name] MRN: [id number] DOB: Feb 12, 1936 86 y.o. PCP: Axel Filler, MD  Date of Admission: 09/24/2022  6:53 PM Date of Discharge: 09/29/22 Attending Physician: Dr. Johnnye Sima   Discharge Diagnosis: Principal Problem:   Chronic atrial fibrillation with RVR ([place]) Active Problems:   Factor V Leiden, prothrombin gene mutation ([place])   Essential hypertension   Long term current use of anticoagulant   Hyperlipidemia   Palliative care encounter   AKI (acute kidney injury) ([place])   Alzheimer's dementia ([place])   Hyponatremia   Hypokalemia   Anorexia   Acute hyponatremia   Severe protein-calorie malnutrition (HCC)   Hyperkalemia   Failure to thrive in adult    Discharge Medications: Allergies as of 09/29/2022       Reactions   Penicillins    REACTION: rash   Sulfonamide Derivatives    REACTION: Unknown reaction        Medication List     STOP taking these medications    acetaminophen-codeine 300-30 MG tablet Commonly known as: TYLENOL #3   diphenhydramine-acetaminophen 25-500 MG Tabs tablet Commonly known as: TYLENOL PM   donepezil 5 MG tablet Commonly known as: ARICEPT   lisinopril-hydrochlorothiazide 10-12.5 MG tablet Commonly known as: ZESTORETIC   pravastatin 20 MG tablet Commonly known as: PRAVACHOL   Xarelto 10 MG Tabs tablet Generic drug: rivaroxaban        Disposition and follow-up:   Judith [name] was discharged from [facility] in Critical condition.  At the hospital follow up visit please address:  1.  Follow-up: N/A  a. End stage Dementia and failure to thrive: Patient discharged on hospice. Ensure patient is comfortable as she moves on to next chapter of life. No lab draws, no antibiotics, no IV fluids.    2.  Labs / imaging needed at time of follow-up: N/A  3.  Pending labs/ test needing follow-up: N/A  4.  Medication Changes  Comfort measure meds have been added including Glycopyrrolate, haldol,  tylenol, benadryl, Ativan, dilaudid, ensure supplement, zofran and polyvinyl alcohol.   Follow-up Appointments: N/A  Hospital Course by problem list: This is a 86 year old female with a PMH of Alzheimer's dementia, atrial fibrillation, factor V Leiden deficiency, PE and DVTs on xarelto who was brought to the ED by husband for poor functional status, decrease PO intake admitted for Afib with FVR and hyponatremia.  During hospitalization, patient was transitiond to comfort care and will move to hospice care.   #Alzheimer's dementia #Failure to thrive #Hypertension #Hyperlipidemia #Hypovolemic hyponatremia #Severe malnutrition #Atrial fibrillation #Electrolyte abnormalities Patient initially came after a 2 week history of poor P.O intake and failure to thrive. Patient had electrolyte abnormalities and we tried to correct the patient's hydration status during the admission. Patient is only oriented x1 at baseline and patient did not improve with supportive care. Patient continued to have poor P.O. intake. Palliative care was consulted. Given patient's failure to thrive, poor p.o. intake, and Alzheimer's dementia, long conversation was had with palliative care during patient's hospitalization, and decision to move to comfort care was made on 09/28/2022.  All interventions have been discontinued including IV antibiotics, labs, imaging, or fluids.  Being plan of care at this time is to ensure patient has the right modalities to keep her comfortable at this time in life.  Patient's husband understands what is going on, spiritual care was available as needed for the patient's husband and the patient. [facility].  Hospice will manage all comfort care modalities on discharge.    Discharge Subjective: Patient is sleeping upon my exam.  Patient has no concerns. Patient is confused at times.  Patient is only oriented to self. Husband is not at bedside.     Discharge Exam:   BP 97/76 (BP Location: Left Arm)   Pulse (!) 105   Temp 98.1 F (36.7 C) (Oral)   Resp 20   Ht '5\' 7"'$  (1.702 m)   Wt 79.2 kg   SpO2 95%   BMI 27.35 kg/m  Constitutional: Chronically ill appearing. Not in acute distress. Skin dry with poor turgor. HENT: normocephalic atraumatic Eyes: conjunctiva non-erythematous Neck: supple Cardiovascular: regular rate and rhythm, no m/r/g Pulmonary/Chest: normal work of breathing on room air, lungs clear to auscultation bilaterally Neurological: alert & oriented x1 Skin: warm and dry, poor skin turgor Psych: Confused  Pertinent Labs, Studies, and Procedures:     Latest Ref Rng & Units 09/27/2022    7:04 AM 09/25/2022    6:15 PM 09/24/2022    6:50 PM  CBC  WBC 4.0 - 10.5 K/uL 10.0  9.8  10.6   Hemoglobin 12.0 - 15.0 g/dL 13.7  12.8  13.8   Hematocrit 36.0 - 46.0 % 40.3  36.6  39.8   Platelets 150 - 400 K/uL 263  225  303        Latest Ref Rng & Units 09/28/2022    5:50 AM 09/27/2022    9:12 PM 09/27/2022   12:26 PM  CMP  Glucose 70 - 99 mg/dL 110  115  76   BUN 8 - 23 mg/dL '21  18  17   '$ Creatinine 0.44 - 1.00 mg/dL 1.21  1.18  1.03   Sodium 135 - 145 mmol/L 126  126  129   Potassium 3.5 - 5.1 mmol/L 5.0  4.9  5.5   Chloride 98 - 111 mmol/L 95  92  94   CO2 22 - 32 mmol/L '16  17  18   '$ Calcium 8.9 - 10.3 mg/dL 8.9  9.0  9.1     DG Chest 2 View  Result Date: 09/24/2022 CLINICAL DATA:  Altered mental status, tachycardia EXAM: CHEST - 2 VIEW COMPARISON:  07/23/2022 FINDINGS: Lungs are clear.  No pleural effusion or pneumothorax. The heart is normal in size. Moderate hiatal hernia. Mild degenerative changes of the visualized thoracolumbar spine. IMPRESSION: Normal chest radiographs. Moderate hiatal hernia. Electronically Signed   By: Julian Hy M.D.   On: 09/24/2022 20:03   CT HEAD WO CONTRAST  Result Date: 09/24/2022 CLINICAL DATA:  TIA.  Mental status change. EXAM: CT HEAD WITHOUT CONTRAST TECHNIQUE:  Contiguous axial images were obtained from the base of the skull through the vertex without intravenous contrast. RADIATION DOSE REDUCTION: This exam was performed according to the departmental dose-optimization program which includes automated exposure control, adjustment of the mA and/or kV according to patient size and/or use of iterative reconstruction technique. COMPARISON:  07/23/2022 FINDINGS: Brain: No acute infarct, hemorrhage, or mass lesion is present. Moderate atrophy and white matter changes are stable. Marked temporal lobe atrophy is noted in particular, similar the prior exam. The ventricles are proportionate to the degree of atrophy. Basal ganglia are within normal limits. No significant extraaxial fluid collection is present. Brainstem and cerebellum are unremarkable. Vascular: Atherosclerotic calcifications are present within the cavernous internal carotid arteries bilaterally. No hyperdense vessel is present. Skull: Calvarium is intact. No focal lytic or blastic lesions are present. No  significant extracranial soft tissue lesion is present. Sinuses/Orbits: Bilateral lens replacements are noted. Globes and orbits are otherwise unremarkable. The paranasal sinuses and mastoid air cells are clear. IMPRESSION: 1. No acute intracranial abnormality or significant interval change. 2. Stable atrophy and white matter disease. This likely reflects the sequela of chronic microvascular ischemia. Electronically Signed   By: San Morelle M.D.   On: 09/24/2022 19:47     Discharge Instructions: Discharge Instructions     Diet - low sodium heart healthy   Complete by: As directed    Increase activity slowly   Complete by: As directed    No wound care   Complete by: As directed       To facility:  Patient is being accepted to your facility on hospice. Please ensure patient is comfortable and patient does not need any lab draws, antibiotics, or any IV fluids. Please ensure the patient is  comfortable. Hospice will be taking care of all the orders.    SignedLeigh Aurora, DO 09/29/2022, 4:34 PM   Pager: [phone]

## 2022-09-29 NOTE — TOC Transition Note (Signed)
Transition of Care [facility]) - CM/SW Discharge Note   Patient Details  Name: Judith [name] MRN: [id number] Date of Birth: 03-21-1936  [facility] CM/SW Contact:  [name], LCSW Phone Number: 09/29/2022, 3:29 PM   Clinical Narrative:   Pt discharging to [facility].  RN call report to [phone].      Final next level of care: [place] Barriers to Discharge: Barriers Resolved   Patient Goals and CMS Choice     Choice offered to / list presented to : Spouse  Discharge Placement              Patient chooses bed at:  [facility]) Patient to be transferred to facility by: [place] Name of family member notified: husband Dan Patient and family notified of of transfer: 09/29/22  Discharge Plan and Services In-house Referral: Clinical Social Work   Post Acute Care Choice: [place]                               Social Determinants of Health (SDOH) Interventions     Readmission Risk Interventions     No data to display

## 2022-10-08 DEATH — deceased

## 2023-11-08 ENCOUNTER — Encounter: Payer: Self-pay | Admitting: *Deleted
